# Patient Record
Sex: Female | Born: 1960 | Race: White | Hispanic: No | Marital: Married | Smoking: Never smoker
Health system: Southern US, Community
[De-identification: ages and names within clinical notes are randomized; demographics above are authoritative.]

## PROBLEM LIST (undated history)

## (undated) DIAGNOSIS — R1310 Dysphagia, unspecified: Secondary | ICD-10-CM

## (undated) DIAGNOSIS — I809 Phlebitis and thrombophlebitis of unspecified site: Secondary | ICD-10-CM

## (undated) DIAGNOSIS — E785 Hyperlipidemia, unspecified: Secondary | ICD-10-CM

## (undated) DIAGNOSIS — K219 Gastro-esophageal reflux disease without esophagitis: Secondary | ICD-10-CM

## (undated) DIAGNOSIS — G4730 Sleep apnea, unspecified: Secondary | ICD-10-CM

## (undated) DIAGNOSIS — T7840XA Allergy, unspecified, initial encounter: Secondary | ICD-10-CM

## (undated) DIAGNOSIS — K5900 Constipation, unspecified: Secondary | ICD-10-CM

## (undated) DIAGNOSIS — F419 Anxiety disorder, unspecified: Secondary | ICD-10-CM

## (undated) DIAGNOSIS — J302 Other seasonal allergic rhinitis: Secondary | ICD-10-CM

## (undated) DIAGNOSIS — M1990 Unspecified osteoarthritis, unspecified site: Secondary | ICD-10-CM

## (undated) DIAGNOSIS — E559 Vitamin D deficiency, unspecified: Secondary | ICD-10-CM

## (undated) HISTORY — PX: WISDOM TOOTH EXTRACTION: SHX21

## (undated) HISTORY — DX: Other seasonal allergic rhinitis: J30.2

## (undated) HISTORY — DX: Phlebitis and thrombophlebitis of unspecified site: I80.9

## (undated) HISTORY — DX: Dysphagia, unspecified: R13.10

## (undated) HISTORY — DX: Vitamin D deficiency, unspecified: E55.9

## (undated) HISTORY — DX: Allergy, unspecified, initial encounter: T78.40XA

## (undated) HISTORY — DX: Hyperlipidemia, unspecified: E78.5

## (undated) HISTORY — DX: Constipation, unspecified: K59.00

## (undated) HISTORY — DX: Unspecified osteoarthritis, unspecified site: M19.90

## (undated) HISTORY — PX: COLONOSCOPY: SHX174

## (undated) HISTORY — DX: Sleep apnea, unspecified: G47.30

---

## 2003-09-25 ENCOUNTER — Emergency Department (HOSPITAL_COMMUNITY): Admission: AD | Admit: 2003-09-25 | Discharge: 2003-09-25 | Payer: Self-pay | Admitting: Family Medicine

## 2004-03-29 ENCOUNTER — Encounter: Admission: RE | Admit: 2004-03-29 | Discharge: 2004-03-29 | Payer: Self-pay | Admitting: Hematology and Oncology

## 2004-06-09 ENCOUNTER — Emergency Department (HOSPITAL_COMMUNITY): Admission: EM | Admit: 2004-06-09 | Discharge: 2004-06-09 | Payer: Self-pay | Admitting: Family Medicine

## 2004-06-09 ENCOUNTER — Ambulatory Visit (HOSPITAL_COMMUNITY): Admission: RE | Admit: 2004-06-09 | Discharge: 2004-06-09 | Payer: Self-pay | Admitting: Family Medicine

## 2004-12-05 ENCOUNTER — Emergency Department (HOSPITAL_COMMUNITY): Admission: EM | Admit: 2004-12-05 | Discharge: 2004-12-05 | Payer: Self-pay | Admitting: Family Medicine

## 2005-04-17 ENCOUNTER — Encounter: Admission: RE | Admit: 2005-04-17 | Discharge: 2005-04-17 | Payer: Self-pay | Admitting: Internal Medicine

## 2005-05-19 ENCOUNTER — Emergency Department (HOSPITAL_COMMUNITY): Admission: EM | Admit: 2005-05-19 | Discharge: 2005-05-19 | Payer: Self-pay | Admitting: Family Medicine

## 2005-08-18 ENCOUNTER — Emergency Department (HOSPITAL_COMMUNITY): Admission: EM | Admit: 2005-08-18 | Discharge: 2005-08-18 | Payer: Self-pay | Admitting: Family Medicine

## 2005-09-22 ENCOUNTER — Emergency Department (HOSPITAL_COMMUNITY): Admission: EM | Admit: 2005-09-22 | Discharge: 2005-09-22 | Payer: Self-pay | Admitting: Family Medicine

## 2005-10-04 ENCOUNTER — Ambulatory Visit (HOSPITAL_COMMUNITY): Admission: RE | Admit: 2005-10-04 | Discharge: 2005-10-04 | Payer: Self-pay | Admitting: Allergy and Immunology

## 2006-06-20 ENCOUNTER — Encounter: Admission: RE | Admit: 2006-06-20 | Discharge: 2006-06-20 | Payer: Self-pay | Admitting: Internal Medicine

## 2006-09-22 ENCOUNTER — Emergency Department (HOSPITAL_COMMUNITY): Admission: EM | Admit: 2006-09-22 | Discharge: 2006-09-22 | Payer: Self-pay | Admitting: Emergency Medicine

## 2007-08-04 ENCOUNTER — Encounter: Admission: RE | Admit: 2007-08-04 | Discharge: 2007-08-04 | Payer: Self-pay | Admitting: Internal Medicine

## 2008-07-30 ENCOUNTER — Emergency Department (HOSPITAL_COMMUNITY): Admission: EM | Admit: 2008-07-30 | Discharge: 2008-07-30 | Payer: Self-pay | Admitting: Family Medicine

## 2008-08-15 ENCOUNTER — Encounter: Admission: RE | Admit: 2008-08-15 | Discharge: 2008-08-15 | Payer: Self-pay | Admitting: Internal Medicine

## 2009-04-19 ENCOUNTER — Emergency Department (HOSPITAL_COMMUNITY): Admission: EM | Admit: 2009-04-19 | Discharge: 2009-04-19 | Payer: Self-pay | Admitting: Family Medicine

## 2009-09-28 ENCOUNTER — Encounter: Admission: RE | Admit: 2009-09-28 | Discharge: 2009-09-28 | Payer: Self-pay | Admitting: Internal Medicine

## 2010-10-28 ENCOUNTER — Encounter: Payer: Self-pay | Admitting: Internal Medicine

## 2010-10-29 ENCOUNTER — Encounter: Payer: Self-pay | Admitting: Internal Medicine

## 2010-10-30 ENCOUNTER — Encounter
Admission: RE | Admit: 2010-10-30 | Discharge: 2010-10-30 | Payer: Self-pay | Source: Home / Self Care | Attending: Internal Medicine | Admitting: Internal Medicine

## 2011-10-03 ENCOUNTER — Other Ambulatory Visit: Payer: Self-pay | Admitting: Internal Medicine

## 2011-10-03 DIAGNOSIS — Z1231 Encounter for screening mammogram for malignant neoplasm of breast: Secondary | ICD-10-CM

## 2011-11-01 ENCOUNTER — Ambulatory Visit: Payer: Self-pay

## 2011-11-12 ENCOUNTER — Ambulatory Visit: Payer: Self-pay

## 2011-11-18 ENCOUNTER — Ambulatory Visit
Admission: RE | Admit: 2011-11-18 | Discharge: 2011-11-18 | Disposition: A | Discharge status: Home / Self Care | Payer: Private Health Insurance - Indemnity | Source: Ambulatory Visit | Attending: Internal Medicine | Admitting: Internal Medicine

## 2011-11-18 DIAGNOSIS — Z1231 Encounter for screening mammogram for malignant neoplasm of breast: Secondary | ICD-10-CM

## 2011-12-22 ENCOUNTER — Emergency Department (INDEPENDENT_AMBULATORY_CARE_PROVIDER_SITE_OTHER)
Admission: EM | Admit: 2011-12-22 | Discharge: 2011-12-22 | Disposition: A | Discharge status: Home / Self Care | Payer: Managed Care, Other (non HMO) | Source: Home / Self Care | Attending: Emergency Medicine | Admitting: Emergency Medicine

## 2011-12-22 ENCOUNTER — Encounter (HOSPITAL_COMMUNITY): Payer: Self-pay

## 2011-12-22 DIAGNOSIS — J069 Acute upper respiratory infection, unspecified: Secondary | ICD-10-CM

## 2011-12-22 MED ORDER — AMOXICILLIN 875 MG PO TABS: 875.0000 mg | ORAL_TABLET | Freq: Two times a day (BID) | ORAL | Status: AC

## 2011-12-22 NOTE — ED Provider Notes (Signed)
History     CSN: [id number]  Arrival date & time 12/22/11  1013   First MD Initiated Contact with Patient 12/22/11 1025      Chief Complaint  Patient presents with  . Sinusitis    sinus pressure, cough, sore throat, fever    (Consider location/radiation/quality/duration/timing/severity/associated sxs/prior treatment) HPI Comments: PT with frequent sinus infections.  Began feeling ill 2-3 days ago, sinus pressure is worsening.    Patient is a 51 y.o. female presenting with sinusitis. The history is provided by the patient.  Sinusitis  This is a new problem. The current episode started 2 days ago. The problem has been gradually worsening. Maximum temperature: temp this am was 99.8 so took ibuprofen. The fever has been present for less than 1 day. The pain is at a severity of 8/10. The pain is severe. The pain has been constant since onset. Associated symptoms include chills, congestion, ear pain, sinus pressure, sore throat and cough. Pertinent negatives include no shortness of breath. Treatments tried: sudafed, zyrtec and robitussin dm. The treatment provided no relief.    Past Medical History  Diagnosis Date  . Environmental allergies     History reviewed. No pertinent past surgical history.  Family History  Problem Relation Age of Onset  . Hypertension Mother   . Diabetes Mother     History  Substance Use Topics  . Smoking status: Never Smoker   . Smokeless tobacco: Not on file  . Alcohol Use: Yes     occas.    OB History    Grav Para Term Preterm Abortions TAB SAB Ect Mult Living                  Review of Systems  Constitutional: Positive for chills. Negative for fever.  HENT: Positive for ear pain, congestion, sore throat, postnasal drip and sinus pressure.   Respiratory: Positive for cough. Negative for shortness of breath and wheezing.   Skin: Negative for rash.    Allergies  Sulfa antibiotics  Home Medications   Current Outpatient Rx  Name Route  Sig Dispense Refill  . CELECOXIB 200 MG PO CAPS Oral Take 200 mg by mouth 2 (two) times daily.    Marland Kitchen CETIRIZINE HCL 10 MG PO TABS Oral Take 10 mg by mouth daily.    Lenn Sink DM PO Oral Take by mouth.    . IBUPROFEN 200 MG PO TABS Oral Take 200 mg by mouth every 6 (six) hours as needed.    Marland Kitchen PSEUDOEPHEDRINE HCL 30 MG PO TABS Oral Take 30 mg by mouth every 4 (four) hours as needed.    . AMOXICILLIN 875 MG PO TABS Oral Take 1 tablet (875 mg total) by mouth 2 (two) times daily. 20 tablet 0    BP 141/93  Pulse 85  Temp(Src) 98.8 F (37.1 C) (Oral)  Resp 18  SpO2 98%  Physical Exam  Constitutional: She appears well-developed and well-nourished. No distress.  HENT:  Right Ear: Tympanic membrane, external ear and ear canal normal.  Left Ear: Tympanic membrane, external ear and ear canal normal.  Nose: Mucosal edema present. No rhinorrhea. Right sinus exhibits maxillary sinus tenderness and frontal sinus tenderness. Left sinus exhibits maxillary sinus tenderness and frontal sinus tenderness.  Mouth/Throat: Oropharynx is clear and moist.  Cardiovascular: Normal rate and regular rhythm.   Pulmonary/Chest: Effort normal and breath sounds normal.    ED Course  Procedures (including critical care time)  Labs Reviewed - No data to display No  results found.   1. URI (upper respiratory infection)       MDM  Pt does not have sinus infection, but apparently gets them frequently.  Discussed at length that pt does not need antibiotics at this time.  Pt requests rx for antibiotics that she can take if she gets worse or is not better after 10 days.          Cathlyn Parsons, NP 12/22/11 1235

## 2011-12-22 NOTE — Discharge Instructions (Signed)
You have an upper respiratory viral infection with sinus congestion.  You do not have a bacterial sinus infection.  Occasionally, this type of illness can develop into a bacterial infection.  Please wait 10 days before starting to take the antibiotics.  You should be feeling better by then.  If you are not, you may begin taking the antibiotics.  In the meantime, use your neti pot several times a day, and continue using the over the counter cold medicines.  Drink LOTS of liquids.   Upper Respiratory Infection, Adult An upper respiratory infection (URI) is also known as the common cold. It is often caused by a type of germ (virus). Colds are easily spread (contagious). You can pass it to others by kissing, coughing, sneezing, or drinking out of the same glass. Usually, you get better in 1 or 2 weeks.  HOME CARE   Only take medicine as told by your doctor.   Use a warm mist humidifier or breathe in steam from a hot shower.   Drink enough water and fluids to keep your pee (urine) clear or pale yellow.   Get plenty of rest.   Return to work when your temperature is back to normal or as told by your doctor. You may use a face mask and wash your hands to stop your cold from spreading.  GET HELP RIGHT AWAY IF:   After the first few days, you feel you are getting worse.   You have questions about your medicine.   You have chills, shortness of breath, or brown or red spit (mucus).   You have yellow or brown snot (nasal discharge) or pain in the face, especially when you bend forward.   You have a fever, puffy (swollen) neck, pain when you swallow, or white spots in the back of your throat.   You have a bad headache, ear pain, sinus pain, or chest pain.   You have a high-pitched whistling sound when you breathe in and out (wheezing).   You have a lasting cough or cough up blood.   You have sore muscles or a stiff neck.  MAKE SURE YOU:   Understand these instructions.   Will watch your  condition.   Will get help right away if you are not doing well or get worse.  Document Released: 03/11/2008 Document Revised: 09/12/2011 Document Reviewed: 01/28/2011 [facility] Patient Information 2012 [place], [place].

## 2011-12-22 NOTE — ED Provider Notes (Signed)
Medical screening examination/treatment/procedure(s) were performed by non-physician practitioner and as supervising physician I was immediately available for consultation/collaboration.  Raynald Blend, MD 12/22/11 1329

## 2011-12-22 NOTE — ED Notes (Signed)
Pt c/o sinus pressure/pain/congestion, cough, sore throat and low grade fever. States symptoms started 2 days ago.

## 2012-08-11 ENCOUNTER — Telehealth: Payer: Self-pay | Admitting: Internal Medicine

## 2012-08-11 ENCOUNTER — Encounter: Payer: Self-pay | Admitting: Internal Medicine

## 2012-08-11 NOTE — Telephone Encounter (Signed)
Forward 10 pages from

## 2012-08-11 NOTE — Telephone Encounter (Signed)
Forward 10 pages from [facility] to Dr. Stan Head for review on 08-11-12 ym

## 2012-09-25 ENCOUNTER — Encounter: Payer: Managed Care, Other (non HMO) | Admitting: Internal Medicine

## 2012-12-29 ENCOUNTER — Other Ambulatory Visit: Payer: Self-pay

## 2012-12-29 DIAGNOSIS — Z1231 Encounter for screening mammogram for malignant neoplasm of breast: Secondary | ICD-10-CM

## 2013-02-01 ENCOUNTER — Ambulatory Visit
Admission: RE | Admit: 2013-02-01 | Discharge: 2013-02-01 | Disposition: A | Discharge status: Home / Self Care | Payer: 59 | Source: Ambulatory Visit

## 2013-02-01 DIAGNOSIS — Z1231 Encounter for screening mammogram for malignant neoplasm of breast: Secondary | ICD-10-CM

## 2013-02-03 ENCOUNTER — Other Ambulatory Visit: Payer: Self-pay | Admitting: Internal Medicine

## 2013-02-03 DIAGNOSIS — R928 Other abnormal and inconclusive findings on diagnostic imaging of breast: Secondary | ICD-10-CM

## 2013-02-10 ENCOUNTER — Ambulatory Visit
Admission: RE | Admit: 2013-02-10 | Discharge: 2013-02-10 | Disposition: A | Discharge status: Home / Self Care | Payer: 59 | Source: Ambulatory Visit | Attending: Internal Medicine | Admitting: Internal Medicine

## 2013-02-10 DIAGNOSIS — R928 Other abnormal and inconclusive findings on diagnostic imaging of breast: Secondary | ICD-10-CM

## 2013-05-20 ENCOUNTER — Encounter: Payer: Self-pay | Admitting: Internal Medicine

## 2013-07-23 ENCOUNTER — Ambulatory Visit (AMBULATORY_SURGERY_CENTER): Payer: Self-pay | Admitting: *Deleted

## 2013-07-23 VITALS — Ht 66.0 in | Wt 172.0 lb

## 2013-07-23 DIAGNOSIS — Z1211 Encounter for screening for malignant neoplasm of colon: Secondary | ICD-10-CM

## 2013-07-23 MED ORDER — NA SULFATE-K SULFATE-MG SULF 17.5-3.13-1.6 GM/177ML PO SOLN: 1.0000 | Freq: Once | ORAL | Status: DC

## 2013-07-23 NOTE — Progress Notes (Signed)
No egg or soy allergy. No anesthesia problems.  

## 2013-07-27 ENCOUNTER — Telehealth: Payer: Self-pay | Admitting: Internal Medicine

## 2013-07-27 NOTE — Telephone Encounter (Signed)
Spoke with patient regarding the medication (Qsymia), which she is taking for weight loss. She is taking 3.75mg  every other day.I spoke with Janet-the nurse anesthetist who states the pt has to go off this medication for 7- 10 days prior to the colonoscopy or she will need to reschedule the procedure. The pt was advised about the medication, but will stop it today. She will keep her appt. for the colonoscopy for 08/06/13 as planned.The pt will call back if she has any other questions.Cherylann Ratel

## 2013-08-06 ENCOUNTER — Ambulatory Visit (AMBULATORY_SURGERY_CENTER): Payer: 59 | Admitting: Internal Medicine

## 2013-08-06 ENCOUNTER — Encounter: Payer: Self-pay | Admitting: Internal Medicine

## 2013-08-06 VITALS — BP 116/70 | HR 56 | Temp 98.2°F | Resp 17 | Ht 66.0 in | Wt 172.0 lb

## 2013-08-06 DIAGNOSIS — Z1211 Encounter for screening for malignant neoplasm of colon: Secondary | ICD-10-CM

## 2013-08-06 MED ORDER — SODIUM CHLORIDE 0.9 % IV SOLN: 500.0000 mL | INTRAVENOUS | Status: DC

## 2013-08-06 NOTE — Patient Instructions (Addendum)
Your colon is normal.   Next routine colonoscopy in 10 years - 2024.  I appreciate the opportunity to care for you. Iva Boop, MD, FACG YOU HAD AN ENDOSCOPIC PROCEDURE TODAY AT THE [facility]: Refer to the procedure report that was given to you for any specific questions about what was found during the examination.  If the procedure report does not answer your questions, please call your gastroenterologist to clarify.  If you requested that your care partner not be given the details of your procedure findings, then the procedure report has been included in a sealed envelope for you to review at your convenience later.  YOU SHOULD EXPECT: Some feelings of bloating in the abdomen. Passage of more gas than usual.  Walking can help get rid of the air that was put into your GI tract during the procedure and reduce the bloating. If you had a lower endoscopy (such as a colonoscopy or flexible sigmoidoscopy) you may notice spotting of blood in your stool or on the toilet paper. If you underwent a bowel prep for your procedure, then you may not have a normal bowel movement for a few days.  DIET: Your first meal following the procedure should be a light meal and then it is ok to progress to your normal diet.  A half-sandwich or bowl of soup is an example of a good first meal.  Heavy or fried foods are harder to digest and may make you feel nauseous or bloated.  Likewise meals heavy in dairy and vegetables can cause extra gas to form and this can also increase the bloating.  Drink plenty of fluids but you should avoid alcoholic beverages for 24 hours.  ACTIVITY: Your care partner should take you home directly after the procedure.  You should plan to take it easy, moving slowly for the rest of the day.  You can resume normal activity the day after the procedure however you should NOT DRIVE or use heavy machinery for 24 hours (because of the sedation medicines used during the test).    SYMPTOMS  TO REPORT IMMEDIATELY: A gastroenterologist can be reached at any hour.  During normal business hours, 8:30 AM to 5:00 PM Monday through Friday, call [phone].  After hours and on weekends, please call the GI answering service at [phone] who will take a message and have the physician on call contact you.   Following lower endoscopy (colonoscopy or flexible sigmoidoscopy):  Excessive amounts of blood in the stool  Significant tenderness or worsening of abdominal pains  Swelling of the abdomen that is new, acute  Fever of 100F or higher  FOLLOW UP: If any biopsies were taken you will be contacted by phone or by letter within the next 1-3 weeks.  Call your gastroenterologist if you have not heard about the biopsies in 3 weeks.  Our staff will call the home number listed on your records the next business day following your procedure to check on you and address any questions or concerns that you may have at that time regarding the information given to you following your procedure. This is a courtesy call and so if there is no answer at the home number and we have not heard from you through the emergency physician on call, we will assume that you have returned to your regular daily activities without incident.  SIGNATURES/CONFIDENTIALITY: You and/or your care partner have signed paperwork which will be entered into your electronic medical record.  These signatures  attest to the fact that that the information above on your After Visit Summary has been reviewed and is understood.  Full responsibility of the confidentiality of this discharge information lies with you and/or your care-partner.

## 2013-08-06 NOTE — Progress Notes (Signed)
Patient did not experience any of the following events: a burn prior to discharge; a fall within the facility; wrong site/side/patient/procedure/implant event; or a hospital transfer or hospital admission upon discharge from the facility. (G8907) Patient did not have preoperative order for IV antibiotic SSI prophylaxis. (G8918)  

## 2013-08-06 NOTE — Op Note (Signed)
[facility] [address] [place], [address]   COLONOSCOPY PROCEDURE REPORT  PATIENT: Victoria Herman, Victoria Herman  MR#: [id number] BIRTHDATE: 1960/11/07 , 51  yrs. old GENDER: Female ENDOSCOPIST: Iva Boop, MD, [facility] PROCEDURE DATE:  08/06/2013 PROCEDURE:   Colonoscopy, screening First Screening Colonoscopy - Avg.  risk and is 50 yrs.  old or older Yes.  Prior Negative Screening - Now for repeat screening. N/A  History of Adenoma - Now for follow-up colonoscopy & has been > or = to 3 yrs.  N/A  Polyps Removed Today? No.  Recommend repeat exam, <10 yrs? No. ASA CLASS:   Class I INDICATIONS:average risk screening and first colonoscopy. MEDICATIONS: propofol (Diprivan) 200mg  IV, These medications were titrated to patient response per physician's verbal order, and MAC sedation, administered by CRNA  DESCRIPTION OF PROCEDURE:   After the risks benefits and alternatives of the procedure were thoroughly explained, informed consent was obtained.  A digital rectal exam revealed no abnormalities of the rectum.   The LB [id number]  endoscope was introduced through the anus and advanced to the cecum, which was identified by both the appendix and ileocecal valve. No adverse events experienced.   The quality of the prep was Suprep good  The instrument was then slowly withdrawn as the colon was fully examined.      COLON FINDINGS: A normal appearing cecum, ileocecal valve, and appendiceal orifice were identified.  The ascending, hepatic flexure, transverse, splenic flexure, descending, sigmoid colon and rectum appeared unremarkable.  No polyps or cancers were seen.   A right colon retroflexion was performed.  Retroflexed views revealed no abnormalities. The time to cecum=2 minutes 31 seconds. Withdrawal time=9 minutes 35 seconds.  The scope was withdrawn and the procedure completed. COMPLICATIONS: There were no complications.  ENDOSCOPIC IMPRESSION: Normal colonoscopy  - excellent prep - first screening exam  RECOMMENDATIONS: Repeat routine screening colonoscopy 10 years - 2024   eSigned:  Iva Boop, MD, [employer] 08/06/2013 10:20 AM   cc: Dorothyann Peng, MD and The Patient

## 2013-08-06 NOTE — Progress Notes (Signed)
Pt stable to RR 

## 2013-08-09 ENCOUNTER — Telehealth: Payer: Self-pay | Admitting: *Deleted

## 2013-08-09 NOTE — Telephone Encounter (Signed)
  Follow up Call-  Call back number 08/06/2013  Post procedure Call Back phone  # [phone]  Permission to leave phone message No     Patient questions:  Do you have a fever, pain , or abdominal swelling? no Pain Score  0 *  Have you tolerated food without any problems? yes  Have you been able to return to your normal activities? yes  Do you have any questions about your discharge instructions: Diet   no Medications  no Follow up visit  no  Do you have questions or concerns about your Care? no  Actions: * If pain score is 4 or above: No action needed, pain <4.

## 2013-12-20 ENCOUNTER — Other Ambulatory Visit: Payer: Self-pay

## 2013-12-20 DIAGNOSIS — Z1231 Encounter for screening mammogram for malignant neoplasm of breast: Secondary | ICD-10-CM

## 2014-02-07 ENCOUNTER — Ambulatory Visit: Payer: 59

## 2014-02-14 ENCOUNTER — Ambulatory Visit
Admission: RE | Admit: 2014-02-14 | Discharge: 2014-02-14 | Disposition: A | Discharge status: Home / Self Care | Payer: 59 | Source: Ambulatory Visit

## 2014-02-14 ENCOUNTER — Encounter (INDEPENDENT_AMBULATORY_CARE_PROVIDER_SITE_OTHER): Payer: Self-pay

## 2014-02-14 DIAGNOSIS — Z1231 Encounter for screening mammogram for malignant neoplasm of breast: Secondary | ICD-10-CM

## 2014-02-16 ENCOUNTER — Other Ambulatory Visit: Payer: Self-pay | Admitting: Internal Medicine

## 2014-02-16 DIAGNOSIS — R928 Other abnormal and inconclusive findings on diagnostic imaging of breast: Secondary | ICD-10-CM

## 2014-02-25 ENCOUNTER — Ambulatory Visit
Admission: RE | Admit: 2014-02-25 | Discharge: 2014-02-25 | Disposition: A | Discharge status: Home / Self Care | Payer: 59 | Source: Ambulatory Visit | Attending: Internal Medicine | Admitting: Internal Medicine

## 2014-02-25 DIAGNOSIS — R928 Other abnormal and inconclusive findings on diagnostic imaging of breast: Secondary | ICD-10-CM

## 2014-03-20 ENCOUNTER — Emergency Department (HOSPITAL_BASED_OUTPATIENT_CLINIC_OR_DEPARTMENT_OTHER): Payer: 59

## 2014-03-20 ENCOUNTER — Emergency Department (HOSPITAL_BASED_OUTPATIENT_CLINIC_OR_DEPARTMENT_OTHER)
Admission: EM | Admit: 2014-03-20 | Discharge: 2014-03-21 | Disposition: A | Discharge status: Home / Self Care | Payer: 59 | Attending: Emergency Medicine | Admitting: Emergency Medicine

## 2014-03-20 ENCOUNTER — Encounter (HOSPITAL_BASED_OUTPATIENT_CLINIC_OR_DEPARTMENT_OTHER): Payer: Self-pay | Admitting: Emergency Medicine

## 2014-03-20 DIAGNOSIS — Z86718 Personal history of other venous thrombosis and embolism: Secondary | ICD-10-CM | POA: Insufficient documentation

## 2014-03-20 DIAGNOSIS — E785 Hyperlipidemia, unspecified: Secondary | ICD-10-CM | POA: Insufficient documentation

## 2014-03-20 DIAGNOSIS — I809 Phlebitis and thrombophlebitis of unspecified site: Secondary | ICD-10-CM | POA: Diagnosis present

## 2014-03-20 DIAGNOSIS — Z79899 Other long term (current) drug therapy: Secondary | ICD-10-CM | POA: Insufficient documentation

## 2014-03-20 DIAGNOSIS — I8000 Phlebitis and thrombophlebitis of superficial vessels of unspecified lower extremity: Secondary | ICD-10-CM | POA: Insufficient documentation

## 2014-03-20 NOTE — ED Notes (Signed)
Pt reports awoke this morning with soreness and redness LLE hx DVT right leg

## 2014-03-20 NOTE — ED Notes (Signed)
Concerned about a small firm area of redness to lateral aspect of left knee.  Strong family history of DVT/PE.  Did make an approx 3 hour drive in the car this am.  Denies chest pain, SHOB.  Pedal pulses present, equal.  No swelling, coolness to extremities.

## 2014-03-20 NOTE — ED Provider Notes (Signed)
CSN: [id number]     Arrival date & time 03/20/14  1914 History  This chart was scribed for Blanchard Kelch, MD by Roxan Diesel, ED scribe.  This patient was seen in room MH12/MH12 and the patient's care was started at 9:01 PM.    Chief Complaint  Patient presents with  . Leg Pain    hx DVT     Patient is a 53 y.o. female presenting with leg pain. The history is provided by the patient. No language interpreter was used.  Leg Pain Location:  Leg Injury: no   Leg location:  L leg Pain details:    Severity:  Moderate   Onset quality: noticed upon waking up.   Duration:  12 hours   Timing:  Constant Chronicity:  New Dislocation: no   Ineffective treatments:  Heat Associated symptoms: no back pain, no fever and no neck pain   Risk factors comment:  History of DVT  HPI Comments: Victoria Herman is a 53 y.o. female with a history of DVT who presents to the Emergency Department complaining of lateral left distal thigh pain that began upon waking up this morning.  She reports associated redness and swelling. Patient states that she applied a warm compress to the area this afternoon without relief. She denies SOB. She previously had a superficial blood clot on her lateral right calf and has a family history of DVT/PE.  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   Past Medical History  Diagnosis Date  . Seasonal allergies   . Hyperlipidemia   . DVT (deep venous thrombosis)    Past Surgical History  Procedure Laterality Date  . Wisdom tooth extraction     Family History  Problem Relation Age of Onset  . Hypertension Mother   . Diabetes Mother   . Colon cancer Neg Hx   . Esophageal cancer Neg Hx   . Rectal cancer Neg Hx   . Stomach cancer Neg Hx    History  Substance Use Topics  . Smoking status: Never Smoker   . Smokeless tobacco: Not on file  . Alcohol Use: Yes     Comment: occas.   OB History   Grav Para Term Preterm Abortions TAB SAB Ect Mult Living                  Review of Systems  Constitutional: Negative for fever and chills.  HENT: Negative for congestion, rhinorrhea and sore throat.   Eyes: Negative for visual disturbance.  Respiratory: Negative for cough and shortness of breath.   Cardiovascular: Negative for chest pain and leg swelling.  Gastrointestinal: Negative for abdominal pain.  Genitourinary: Negative for dysuria.  Musculoskeletal: Negative for back pain and neck pain.       Left lateral thigh pain swelling, and redness.  Skin: Negative for rash.  Neurological: Negative for headaches.  Hematological: Does not bruise/bleed easily.  Psychiatric/Behavioral: Negative for confusion.      Allergies  Sulfa antibiotics  Home Medications   Prior to Admission medications   Medication Sig Start Date End Date Taking? Authorizing Provider  cetirizine (ZYRTEC) 10 MG tablet Take 10 mg by mouth daily.    Historical Provider, MD  cholecalciferol (VITAMIN D) 1000 UNITS tablet Take 1,000 Units by mouth daily.    Historical Provider, MD  ibuprofen (ADVIL,MOTRIN) 200 MG tablet Take 200 mg by mouth every 6 (six) hours as needed.    Historical Provider, MD  Phentermine-Topiramate (QSYMIA) 3.75-23 MG CP24 Take 1 capsule  by mouth every other day.    Historical Provider, MD   BP 153/69  Pulse 90  Temp(Src) 98.3 F (36.8 C) (Oral)  Resp 22  SpO2 99% Physical Exam  Nursing note and vitals reviewed. Constitutional: She is oriented to person, place, and time. She appears well-developed and well-nourished. No distress.  HENT:  Head: Normocephalic and atraumatic.  Mouth/Throat: Oropharynx is clear and moist.  Eyes: Conjunctivae and EOM are normal. Pupils are equal, round, and reactive to light.  Neck: Normal range of motion. Neck supple. No tracheal deviation present.  Cardiovascular: Normal rate, regular rhythm and normal heart sounds.  Exam reveals no gallop and no friction rub.   No murmur heard. Pulmonary/Chest: Effort normal and breath  sounds normal. No respiratory distress. She has no wheezes. She has no rales.  Abdominal: Soft. Bowel sounds are normal. She exhibits no distension and no mass. There is no tenderness. There is no rebound and no guarding.  Musculoskeletal: Normal range of motion.  Mild erythema and soft tissue swelling (approximately 4x4 cm) in the left lateral distal thigh.   Lower extremities symmetric in appearance.  No pain with deep palpation of thighs bilaterally.   Neurological: She is alert and oriented to person, place, and time.  Skin: Skin is warm and dry.  Psychiatric: She has a normal mood and affect. Her behavior is normal.    ED Course  Procedures (including critical care time) DIAGNOSTIC STUDIES: Oxygen Saturation is 99% on room air, normal by my interpretation.    COORDINATION OF CARE: 9:11 PM-Discussed treatment plan which includes [place] with pt at bedside and pt agreed to plan.     Labs Review Labs Reviewed - No data to display  Imaging Review US Venous Img Lower Unilateral Left  03/20/2014   CLINICAL DATA:  Sudden onset of left lateral thigh and anterior leg pain and erythema. History of superficial thrombophlebitis.  EXAM: LEFT LOWER EXTREMITY VENOUS DOPPLER ULTRASOUND  TECHNIQUE: Gray-scale sonography with graded compression, as well as color Doppler and duplex ultrasound were performed to evaluate the lower extremity deep venous systems from the level of the common femoral vein and including the common femoral, femoral, profunda femoral, popliteal and calf veins including the posterior tibial, peroneal and gastrocnemius veins when visible. The superficial great saphenous vein was also interrogated. Spectral Doppler was utilized to evaluate flow at rest and with distal augmentation maneuvers in the common femoral, femoral and popliteal veins.  COMPARISON:  None.  FINDINGS: Common Femoral Vein: No evidence of thrombus. Normal compressibility, respiratory phasicity and response to  augmentation.  Saphenofemoral Junction: No evidence of thrombus. Normal compressibility and flow on color Doppler imaging.  Profunda Femoral Vein: No evidence of thrombus. Normal compressibility and flow on color Doppler imaging.  Femoral Vein: No evidence of thrombus. Normal compressibility, respiratory phasicity and response to augmentation.  Popliteal Vein: No evidence of thrombus. Normal compressibility, respiratory phasicity and response to augmentation.  Calf Veins: No evidence of thrombus. Normal compressibility and flow on color Doppler imaging.  Superficial Great Saphenous Vein: No evidence of thrombus. Normal compressibility and flow on color Doppler imaging.  Venous Reflux:  None.  Other Findings: There is a cluster of superficial veins at the lateral aspect of the distal thigh, containing clot, compatible with the patient's history of superficial thrombophlebitis. This corresponds to the location of the patient's pain.  IMPRESSION: No evidence of deep venous thrombosis. Clot noted within a cluster of superficial veins at the lateral aspect of the distal thigh,  compatible with the patient's history of superficial thrombophlebitis. This corresponds to the location of the patient's pain.   Electronically Signed   By: Garald Balding M.D.   On: 03/20/2014 23:54     EKG Interpretation None      MDM   Final diagnoses:  Superficial thrombophlebitis    11:06 PM 53 y.o. female the history of superficial thrombophlebitis in the right lower extremity and a family history of blood clots who presents with an area of mild erythema and swelling to the left lower lateral thigh which she first noticed today. She denies any shortness of breath or chest pain. She is afebrile and vital signs are unremarkable here. Will get ultrasound to rule out DVT.  12:12 AM: Found to have superficial thrombophlebitis. Will rec warm compresses, NSAIDS.  I have discussed the diagnosis/risks/treatment options with the patient  and believe the pt to be eligible for discharge home to follow-up with pcp next week. We also discussed returning to the ED immediately if new or worsening sx occur. We discussed the sx which are most concerning (e.g., worsening pain, asymmetry, inc swelling) that necessitate immediate return. Medications administered to the patient during their visit and any new prescriptions provided to the patient are listed below.  Medications given during this visit Medications - No data to display  New Prescriptions   No medications on file      I personally performed the services described in this documentation, which was scribed in my presence. The recorded information has been reviewed and is accurate.    Blanchard Kelch, MD 03/21/14 [phone]

## 2014-03-21 DIAGNOSIS — I809 Phlebitis and thrombophlebitis of unspecified site: Secondary | ICD-10-CM | POA: Diagnosis present

## 2014-07-18 ENCOUNTER — Telehealth: Payer: Self-pay | Admitting: *Deleted

## 2014-07-18 ENCOUNTER — Telehealth: Payer: Self-pay

## 2014-07-18 NOTE — Telephone Encounter (Signed)
Called patient to get additional info on her recent bout of thrombophlebitis. Sh had similar symptoms in her left leg in June and had an ultrasound to r/o dvt. The dx was superficial thrombophlebitis. She is now experiencing similar symptoms in her right leg. I suggested heat and Ibuprofen. She would like to come in and have a bilat reflux study to get a baseline venous study for reflux. She saw Dr. Donnetta Hutching in 2006 (?) she thinks, so we will get thaose appts scheduled as sonn as his sched allows.

## 2014-07-18 NOTE — Telephone Encounter (Signed)
Phone call from pt.  C/o "inflammed, hard, tender area on right lower extremity, to the right of the shin."  Reported her symptoms started on Friday, 10/9.  Denies any open sores.  Denies any swelling of the right lower extremity.  Reports seen by her PCP, recently, and told to make appt. with Dr. Donnetta Hutching.   Advised to elevate right lower extremity, apply compression hose, and take Ibuprofen for the discomfort.  Stated she doesn't have compression stockings, but has been taking Ibuprofen and elevating.  Requesting an appt. this week.  Discussed with L. Wert, RN, vein nurse; she will contact pt. Per phone and discuss symptoms.

## 2014-09-07 ENCOUNTER — Other Ambulatory Visit: Payer: Self-pay | Admitting: *Deleted

## 2014-09-07 DIAGNOSIS — I83893 Varicose veins of bilateral lower extremities with other complications: Secondary | ICD-10-CM

## 2014-09-12 ENCOUNTER — Encounter: Payer: Self-pay | Admitting: Vascular Surgery

## 2014-09-13 ENCOUNTER — Ambulatory Visit (HOSPITAL_COMMUNITY)
Admission: RE | Admit: 2014-09-13 | Discharge: 2014-09-13 | Disposition: A | Discharge status: Home / Self Care | Payer: 59 | Source: Ambulatory Visit | Attending: Vascular Surgery | Admitting: Vascular Surgery

## 2014-09-13 ENCOUNTER — Encounter: Payer: Self-pay | Admitting: Vascular Surgery

## 2014-09-13 ENCOUNTER — Ambulatory Visit (INDEPENDENT_AMBULATORY_CARE_PROVIDER_SITE_OTHER): Payer: 59 | Admitting: Vascular Surgery

## 2014-09-13 VITALS — BP 123/84 | HR 90 | Resp 18 | Ht 64.5 in | Wt 200.8 lb

## 2014-09-13 DIAGNOSIS — I872 Venous insufficiency (chronic) (peripheral): Secondary | ICD-10-CM | POA: Diagnosis not present

## 2014-09-13 DIAGNOSIS — I83893 Varicose veins of bilateral lower extremities with other complications: Secondary | ICD-10-CM | POA: Diagnosis not present

## 2014-09-13 NOTE — Progress Notes (Signed)
Patient name: Victoria Herman MRN: [id number] DOB: 05/01/1961 Sex: female   Referred by: [facility]  Reason for referral:  Chief Complaint  Patient presents with  . New Evaluation    c/o 3 episodes of superficial thrombophlebitis episode of #1 03-2014 left lateral thigh episode #2 left inner calf 07-2014 and #3 right outer calf  07-2014    . Varicose Veins    HISTORY OF PRESENT ILLNESS: Patient is seen today for continued discussion of venous pathology. I had seen her approximately 10 years ago at which time workup showed no correctable venous hypertension. 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 These began in her thighs extending all the way down to her feet. She has had several episodes over the past several months where she has had thromboses of these superficial varicosities in these areas of telangiectasia. There are large areas of matting of these as well. She initially presented to the emergency department on the episode involving her left lateral thigh. She reports that she was in the emergency department for 7 hours and eventually was discharged with aspirin and ibuprofen. She's had no bleeding from these and has never had a history of DVT. Does have a family history of DVT in her mother and 1 brother. Does have extensive venous pathology on both sides of her family. 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 Seasonal allergies   . Hyperlipidemia   . Superficial thrombophlebitis 03-2014, 07-2014  . Allergy     Past Surgical History  Procedure Laterality Date  . Wisdom tooth extraction      History   Social History  . Marital Status: Married    Spouse Name: N/A    Number of Children: N/A  . Years of Education: N/A   Occupational History  . Not on file.   Social History Main Topics  . Smoking status: Never Smoker   . Smokeless tobacco: Not on file  . Alcohol  Use: Yes     Comment: occas.  . Drug Use: No  . Sexual Activity: No   Other Topics Concern  . Not on file   Social History Narrative    Family History  Problem Relation Age of Onset  . Hypertension Mother   . Diabetes Mother   . Varicose Veins Mother   . Colon cancer Neg Hx   . Esophageal cancer Neg Hx   . Rectal cancer Neg Hx   . Stomach cancer Neg Hx   . Varicose Veins Father   . Varicose Veins Brother     Allergies as of 09/13/2014 - Review Complete 09/13/2014  Allergen Reaction Noted  . Sulfa antibiotics Rash 12/22/2011    Current Outpatient Prescriptions on File Prior to Visit  Medication Sig Dispense Refill  . cetirizine (ZYRTEC) 10 MG tablet Take 10 mg by mouth daily.    . cholecalciferol (VITAMIN D) 1000 UNITS tablet Take 1,000 Units by mouth daily.    Marland Kitchen ibuprofen (ADVIL,MOTRIN) 200 MG tablet Take 200 mg by mouth every 6 (six) hours as needed.    . Phentermine-Topiramate (QSYMIA) 3.75-23 MG CP24 Take 1 capsule by mouth every other day.     No current facility-administered medications on file prior to visit.     REVIEW OF SYSTEMS:  Positives indicated with an "X"  CARDIOVASCULAR:  [ ]  chest pain   [ ]  chest pressure   [ ]   palpitations   [ ]  orthopnea   [ ]  dyspnea on exertion   [ ]  claudication   [ ]  rest pain   [ ]  DVT   [x ] phlebitis PULMONARY:   [ ]  productive cough   [ ]  asthma   [ ]  wheezing NEUROLOGIC:   [ ]  weakness  [ ]  paresthesias  [ ]  aphasia  [ ]  amaurosis  [ ]  dizziness HEMATOLOGIC:   [ ]  bleeding problems   [ ]  clotting disorders MUSCULOSKELETAL:  [ ]  joint pain   [ ]  joint swelling GASTROINTESTINAL: [ ]   blood in stool  [ ]   hematemesis GENITOURINARY:  [ ]   dysuria  [ ]   hematuria PSYCHIATRIC:  [ ]  history of major depression INTEGUMENTARY:  [ ]  rashes  [ ]  ulcers CONSTITUTIONAL:  [ ]  fever   [ ]  chills  PHYSICAL EXAMINATION:  General: The patient is a well-nourished female, in no acute distress. Vital signs are BP 123/84 mmHg  Pulse  90  Resp 18  Ht 5' 4.5" (1.638 m)  Wt 200 lb 12.8 oz (91.082 kg)  BMI 33.95 kg/m2 Pulmonary: There is a good air exchange   Musculoskeletal: There are no major deformities.  There is no significant extremity pain. Neurologic: No focal weakness or paresthesias are detected, Skin: There are no ulcer or rashes noted. Psychiatric: The patient has normal affect. 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 She does have some reflux in the great saphenous vein in the left thigh only does have a venous incompetence in the common femoral veins bilaterally  Impression and Plan:  Multiple large telangiectasia bilateral lower extremities with recent episodes of some thrombus in these. No evidence of DVT. I do not feel that this she would have any improvement with ablation of the area of reflux in her left great saphenous vein. This does not go into these areas of telangiectasia or where she has had thrombophlebitis. She has tried compression garments in the past with little response from these. I would recommend continued observation only with her usual activity and compression garments on an as-needed basis she was reassured with this discussion will see [place] again on as-needed basis    Dorotea Hand [facility] Office: [phone]

## 2015-01-24 ENCOUNTER — Other Ambulatory Visit: Payer: Self-pay

## 2015-01-24 DIAGNOSIS — Z1231 Encounter for screening mammogram for malignant neoplasm of breast: Secondary | ICD-10-CM

## 2015-02-20 ENCOUNTER — Ambulatory Visit
Admission: RE | Admit: 2015-02-20 | Discharge: 2015-02-20 | Disposition: A | Discharge status: Home / Self Care | Payer: 59 | Source: Ambulatory Visit

## 2015-02-20 ENCOUNTER — Ambulatory Visit: Payer: 59

## 2015-02-20 DIAGNOSIS — Z1231 Encounter for screening mammogram for malignant neoplasm of breast: Secondary | ICD-10-CM

## 2015-11-27 MED FILL — MAGIC MOUTHWASH BOP FORM: 8 days supply | Qty: 150 | Fill #1

## 2015-12-16 DIAGNOSIS — H524 Presbyopia: Secondary | ICD-10-CM | POA: Diagnosis not present

## 2016-03-07 ENCOUNTER — Other Ambulatory Visit: Payer: Self-pay | Admitting: Internal Medicine

## 2016-03-07 DIAGNOSIS — Z1231 Encounter for screening mammogram for malignant neoplasm of breast: Secondary | ICD-10-CM

## 2016-03-21 DIAGNOSIS — J3089 Other allergic rhinitis: Secondary | ICD-10-CM | POA: Diagnosis not present

## 2016-03-25 ENCOUNTER — Ambulatory Visit
Admission: RE | Admit: 2016-03-25 | Discharge: 2016-03-25 | Disposition: A | Discharge status: Home / Self Care | Payer: 59 | Source: Ambulatory Visit | Attending: Internal Medicine | Admitting: Internal Medicine

## 2016-03-25 DIAGNOSIS — Z1231 Encounter for screening mammogram for malignant neoplasm of breast: Secondary | ICD-10-CM | POA: Diagnosis not present

## 2016-04-29 DIAGNOSIS — J3089 Other allergic rhinitis: Secondary | ICD-10-CM | POA: Diagnosis not present

## 2016-05-22 ENCOUNTER — Telehealth: Payer: 59 | Admitting: Family

## 2016-05-22 DIAGNOSIS — B349 Viral infection, unspecified: Secondary | ICD-10-CM

## 2016-05-22 DIAGNOSIS — B9789 Other viral agents as the cause of diseases classified elsewhere: Secondary | ICD-10-CM

## 2016-05-22 DIAGNOSIS — J329 Chronic sinusitis, unspecified: Secondary | ICD-10-CM | POA: Diagnosis not present

## 2016-05-22 MED ORDER — FLUTICASONE PROPIONATE 50 MCG/ACT NA SUSP: 2.0000 | Freq: Every day | NASAL | 6 refills | Status: DC

## 2016-05-22 MED FILL — FLUTICASONE PROP 50 MCG SPR: 50 | 30 days supply | Qty: 16 | Fill #0

## 2016-05-22 NOTE — Progress Notes (Signed)
We are sorry that you are not feeling well.  Here is how we plan to help!  Based on what you have shared with me it looks like you have sinusitis.  Sinusitis is inflammation and infection in the sinus cavities of the head.  Based on your presentation I believe you most likely have Acute Viral Sinusitis.This is an infection most likely caused by a virus. There is not specific treatment for viral sinusitis other than to help you with the symptoms until the infection runs its course.  You may use an oral decongestant such as Mucinex D or if you have glaucoma or high blood pressure use plain Mucinex. Saline nasal spray help and can safely be used as often as needed for congestion, I have prescribed: Fluticasone nasal spray two sprays in each nostril twice a day  If your symptoms do not resolve over the next few days, then you may need an antibiotic at that time.   Some authorities believe that zinc sprays or the use of Echinacea may shorten the course of your symptoms.  Sinus infections are not as easily transmitted as other respiratory infection, however we still recommend that you avoid close contact with loved ones, especially the very young and elderly.  Remember to wash your hands thoroughly throughout the day as this is the number one way to prevent the spread of infection!  Home Care:  Only take medications as instructed by your medical team.  Complete the entire course of an antibiotic.  Do not take these medications with alcohol.  A steam or ultrasonic humidifier can help congestion.  You can place a towel over your head and breathe in the steam from hot water coming from a faucet.  Avoid close contacts especially the very young and the elderly.  Cover your mouth when you cough or sneeze.  Always remember to wash your hands.  Get Help Right Away If:  You develop worsening fever or sinus pain.  You develop a severe head ache or visual changes.  Your symptoms persist after you have  completed your treatment plan.  Make sure you  Understand these instructions.  Will watch your condition.  Will get help right away if you are not doing well or get worse.  Your e-visit answers were reviewed by a board certified advanced clinical practitioner to complete your personal care plan.  Depending on the condition, your plan could have included both over the counter or prescription medications.  If there is a problem please reply  once you have received a response from your provider.  Your safety is important to [place].  If you have drug allergies check your prescription carefully.    You can use MyChart to ask questions about today's visit, request a non-urgent call back, or ask for a work or school excuse for 24 hours related to this e-Visit. If it has been greater than 24 hours you will need to follow up with your provider, or enter a new e-Visit to address those concerns.  You will get an e-mail in the next two days asking about your experience.  I hope that your e-visit has been valuable and will speed your recovery. Thank you for using e-visits.

## 2016-06-06 ENCOUNTER — Other Ambulatory Visit: Payer: Self-pay | Admitting: Occupational Medicine

## 2016-06-06 ENCOUNTER — Ambulatory Visit: Payer: Self-pay

## 2016-06-06 DIAGNOSIS — R0781 Pleurodynia: Secondary | ICD-10-CM

## 2016-09-05 DIAGNOSIS — Z01419 Encounter for gynecological examination (general) (routine) without abnormal findings: Secondary | ICD-10-CM | POA: Diagnosis not present

## 2016-09-05 DIAGNOSIS — Z6835 Body mass index (BMI) 35.0-35.9, adult: Secondary | ICD-10-CM | POA: Diagnosis not present

## 2016-09-05 DIAGNOSIS — Z1212 Encounter for screening for malignant neoplasm of rectum: Secondary | ICD-10-CM | POA: Diagnosis not present

## 2016-09-05 DIAGNOSIS — Z0000 Encounter for general adult medical examination without abnormal findings: Secondary | ICD-10-CM | POA: Diagnosis not present

## 2016-09-06 DIAGNOSIS — D2262 Melanocytic nevi of left upper limb, including shoulder: Secondary | ICD-10-CM | POA: Diagnosis not present

## 2016-09-06 DIAGNOSIS — I8391 Asymptomatic varicose veins of right lower extremity: Secondary | ICD-10-CM | POA: Diagnosis not present

## 2016-09-06 DIAGNOSIS — I8392 Asymptomatic varicose veins of left lower extremity: Secondary | ICD-10-CM | POA: Diagnosis not present

## 2016-09-06 DIAGNOSIS — D1801 Hemangioma of skin and subcutaneous tissue: Secondary | ICD-10-CM | POA: Diagnosis not present

## 2016-09-06 DIAGNOSIS — D225 Melanocytic nevi of trunk: Secondary | ICD-10-CM | POA: Diagnosis not present

## 2016-09-06 DIAGNOSIS — D2272 Melanocytic nevi of left lower limb, including hip: Secondary | ICD-10-CM | POA: Diagnosis not present

## 2016-09-06 DIAGNOSIS — D2261 Melanocytic nevi of right upper limb, including shoulder: Secondary | ICD-10-CM | POA: Diagnosis not present

## 2016-09-06 DIAGNOSIS — L821 Other seborrheic keratosis: Secondary | ICD-10-CM | POA: Diagnosis not present

## 2016-09-06 DIAGNOSIS — D2211 Melanocytic nevi of right eyelid, including canthus: Secondary | ICD-10-CM | POA: Diagnosis not present

## 2017-01-21 MED FILL — MOMETASONE FUROATE 50 MCG S: 50 | 30 days supply | Qty: 17 | Fill #0

## 2017-02-14 ENCOUNTER — Other Ambulatory Visit: Payer: Self-pay | Admitting: Internal Medicine

## 2017-02-14 DIAGNOSIS — Z1231 Encounter for screening mammogram for malignant neoplasm of breast: Secondary | ICD-10-CM

## 2017-03-31 ENCOUNTER — Ambulatory Visit
Admission: RE | Admit: 2017-03-31 | Discharge: 2017-03-31 | Disposition: A | Discharge status: Home / Self Care | Payer: 59 | Source: Ambulatory Visit | Attending: Internal Medicine | Admitting: Internal Medicine

## 2017-03-31 DIAGNOSIS — Z1231 Encounter for screening mammogram for malignant neoplasm of breast: Secondary | ICD-10-CM

## 2017-04-01 MED FILL — EPINEPHRINE 0.3 MG AUTO-INJ: 0.3 | 2 days supply | Qty: 2 | Fill #0

## 2017-04-03 ENCOUNTER — Other Ambulatory Visit: Payer: Self-pay | Admitting: Internal Medicine

## 2017-04-03 DIAGNOSIS — E2839 Other primary ovarian failure: Secondary | ICD-10-CM

## 2017-04-07 DIAGNOSIS — J3089 Other allergic rhinitis: Secondary | ICD-10-CM | POA: Diagnosis not present

## 2017-04-08 ENCOUNTER — Ambulatory Visit
Admission: RE | Admit: 2017-04-08 | Discharge: 2017-04-08 | Disposition: A | Discharge status: Home / Self Care | Payer: 59 | Source: Ambulatory Visit | Attending: Internal Medicine | Admitting: Internal Medicine

## 2017-04-08 DIAGNOSIS — M8589 Other specified disorders of bone density and structure, multiple sites: Secondary | ICD-10-CM | POA: Diagnosis not present

## 2017-04-08 DIAGNOSIS — Z780 Asymptomatic menopausal state: Secondary | ICD-10-CM | POA: Diagnosis not present

## 2017-04-08 DIAGNOSIS — E2839 Other primary ovarian failure: Secondary | ICD-10-CM

## 2017-04-24 DIAGNOSIS — J3089 Other allergic rhinitis: Secondary | ICD-10-CM | POA: Diagnosis not present

## 2017-09-24 MED FILL — AZELASTINE HCL 137 MCG SPRY: 0.1 | 25 days supply | Qty: 30 | Fill #0

## 2017-09-25 ENCOUNTER — Telehealth: Payer: 59 | Admitting: Physician Assistant

## 2017-09-25 DIAGNOSIS — J069 Acute upper respiratory infection, unspecified: Secondary | ICD-10-CM | POA: Diagnosis not present

## 2017-09-25 DIAGNOSIS — B9789 Other viral agents as the cause of diseases classified elsewhere: Secondary | ICD-10-CM | POA: Diagnosis not present

## 2017-09-25 MED ORDER — BENZONATATE 100 MG PO CAPS: 100.0000 mg | ORAL_CAPSULE | Freq: Three times a day (TID) | ORAL | 0 refills | Status: DC | PRN

## 2017-09-25 NOTE — Progress Notes (Signed)

## 2017-10-15 MED FILL — MOMETASONE FUROATE 50 MCG S: 50 | 30 days supply | Qty: 17 | Fill #1

## 2017-10-24 ENCOUNTER — Telehealth: Payer: 59 | Admitting: Nurse Practitioner

## 2017-10-24 DIAGNOSIS — R05 Cough: Secondary | ICD-10-CM | POA: Diagnosis not present

## 2017-10-24 DIAGNOSIS — R059 Cough, unspecified: Secondary | ICD-10-CM

## 2017-10-24 MED ORDER — AZITHROMYCIN 250 MG PO TABS: ORAL_TABLET | ORAL | 0 refills | Status: DC

## 2017-10-24 MED FILL — AZITHROMYCIN 250 MG TABLET: 250 | 5 days supply | Qty: 6 | Fill #0

## 2017-10-24 NOTE — Progress Notes (Signed)
We are sorry that you are not feeling well.  Here is how we plan to help!  Based on your presentation I believe you most likely have A cough due to bacteria.  When patients have a fever and a productive cough with a change in color or increased sputum production, we are concerned about bacterial bronchitis.  If left untreated it can progress to pneumonia.  If your symptoms do not improve with your treatment plan it is important that you contact your provider.   I have prescribed Azithromyin 250 mg: two tablets now and then one tablet daily for 4 additonal days    In addition you may use A non-prescription cough medication called Mucinex DM: take 2 tablets every 12 hours. and A prescription cough medication called Tessalon Perles 100mg . You may take 1-2 capsules every 8 hours as needed for your cough.   From your responses in the eVisit questionnaire you describe inflammation in the upper respiratory tract which is causing a significant cough.  This is commonly called Bronchitis and has four common causes:    Allergies  Viral Infections  Acid Reflux  Bacterial Infection Allergies, viruses and acid reflux are treated by controlling symptoms or eliminating the cause. An example might be a cough caused by taking certain blood pressure medications. You stop the cough by changing the medication. Another example might be a cough caused by acid reflux. Controlling the reflux helps control the cough.  USE OF BRONCHODILATOR ("RESCUE") INHALERS: There is a risk from using your bronchodilator too frequently.  The risk is that over-reliance on a medication which only relaxes the muscles surrounding the breathing tubes can reduce the effectiveness of medications prescribed to reduce swelling and congestion of the tubes themselves.  Although you feel brief relief from the bronchodilator inhaler, your asthma may actually be worsening with the tubes becoming more swollen and filled with mucus.  This can delay  other crucial treatments, such as oral steroid medications. If you need to use a bronchodilator inhaler daily, several times per day, you should discuss this with your provider.  There are probably better treatments that could be used to keep your asthma under control.     HOME CARE . Only take medications as instructed by your medical team. . Complete the entire course of an antibiotic. . Drink plenty of fluids and get plenty of rest. . Avoid close contacts especially the very young and the elderly . Cover your mouth if you cough or cough into your sleeve. . Always remember to wash your hands . A steam or ultrasonic humidifier can help congestion.   GET HELP RIGHT AWAY IF: . You develop worsening fever. . You become short of breath . You cough up blood. . Your symptoms persist after you have completed your treatment plan MAKE SURE YOU   Understand these instructions.  Will watch your condition.  Will get help right away if you are not doing well or get worse.  Your e-visit answers were reviewed by a board certified advanced clinical practitioner to complete your personal care plan.  Depending on the condition, your plan could have included both over the counter or prescription medications. If there is a problem please reply  once you have received a response from your provider. Your safety is important to [place].  If you have drug allergies check your prescription carefully.    You can use MyChart to ask questions about today's visit, request a non-urgent call back, or ask for a work  or school excuse for 24 hours related to this e-Visit. If it has been greater than 24 hours you will need to follow up with your provider, or enter a new e-Visit to address those concerns. You will get an e-mail in the next two days asking about your experience.  I hope that your e-visit has been valuable and will speed your recovery. Thank you for using e-visits.

## 2017-10-26 ENCOUNTER — Encounter: Payer: Self-pay | Admitting: Emergency Medicine

## 2017-10-26 ENCOUNTER — Emergency Department (INDEPENDENT_AMBULATORY_CARE_PROVIDER_SITE_OTHER)
Admission: EM | Admit: 2017-10-26 | Discharge: 2017-10-26 | Disposition: A | Discharge status: Home / Self Care | Payer: 59 | Source: Home / Self Care | Attending: Family Medicine | Admitting: Family Medicine

## 2017-10-26 ENCOUNTER — Emergency Department (INDEPENDENT_AMBULATORY_CARE_PROVIDER_SITE_OTHER): Payer: 59

## 2017-10-26 DIAGNOSIS — R509 Fever, unspecified: Secondary | ICD-10-CM | POA: Diagnosis not present

## 2017-10-26 DIAGNOSIS — R05 Cough: Secondary | ICD-10-CM

## 2017-10-26 DIAGNOSIS — R053 Chronic cough: Secondary | ICD-10-CM

## 2017-10-26 LAB — POCT CBC W AUTO DIFF (K'VILLE URGENT CARE)

## 2017-10-26 LAB — POCT INFLUENZA A/B
INFLUENZA B, POC: NEGATIVE
Influenza A, POC: NEGATIVE

## 2017-10-26 MED ORDER — PREDNISONE 20 MG PO TABS: ORAL_TABLET | ORAL | 0 refills | Status: DC

## 2017-10-26 MED ORDER — BENZONATATE 200 MG PO CAPS: ORAL_CAPSULE | ORAL | 0 refills | Status: DC

## 2017-10-26 NOTE — ED Triage Notes (Signed)
Patient complaining of non-productive cough since 12/16, on Zpack and Mucinex still running a low grade fever, requesting CXR.  Can hear some wheezing when laying down.

## 2017-10-26 NOTE — Discharge Instructions (Signed)
Continue Azithromycin. Take plain guaifenesin (1200mg  extended release tabs such as Mucinex) twice daily, with plenty of water, for cough and congestion.  May add Pseudoephedrine (30mg , one or two every 4 to 6 hours) for sinus congestion.  Get adequate rest.   May use Afrin nasal spray (or generic oxymetazoline) each morning for about 5 days and then discontinue.  Also recommend using saline nasal spray several times daily and saline nasal irrigation (AYR is a common brand).  Use Nasonex nasal spray each morning after using Afrin nasal spray and saline nasal irrigation. Try warm salt water gargles for sore throat.  Stop all antihistamines for now, and other non-prescription cough/cold preparations.

## 2017-10-26 NOTE — ED Provider Notes (Signed)
[facility]    CSN: [id number] Arrival date & time: 10/26/17  1219     History   Chief Complaint Chief Complaint  Patient presents with  . Cough    HPI Victoria Herman is a 57 y.o. female.   Patient reports that she developed a cold about one month ago and then visited [place].  Upon returning about 2.5 weeks ago she had persistent sinus congestion and cough.  During the past week she became increasingly fatigued.  She participated in an e-visit two days ago and was started on a Z-pak.  She has been taking Mucinex DM and Delsym at bedtime.  Last night she developed a low grade fever and her ears feel clogged.  She had mild wheezing today. She has a family history of asthma (brother).   The history is provided by the patient.    Past Medical History:  Diagnosis Date  . Allergy   . Hyperlipidemia   . Seasonal allergies   . Superficial thrombophlebitis 03-2014, 07-2014    Patient Active Problem List   Diagnosis Date Noted  . Superficial thrombophlebitis 03/21/2014    Past Surgical History:  Procedure Laterality Date  . WISDOM TOOTH EXTRACTION      OB History    No data available       Home Medications    Prior to Admission medications   Medication Sig Start Date End Date Taking? Authorizing Provider  azithromycin (ZITHROMAX Z-PAK) 250 MG tablet As directed 10/24/17   Hassell Done, Mary-Margaret, FNP  benzonatate (TESSALON) 200 MG capsule Take one cap by mouth at bedtime as needed for cough.  May repeat in 4 to 6 hours 10/26/17   Kandra Nicolas, MD  cetirizine (ZYRTEC) 10 MG tablet Take 10 mg by mouth daily.    [provider]  predniSONE (DELTASONE) 20 MG tablet Take one tab by mouth twice daily for 5 days, then one daily. Take with food. 10/26/17   Kandra Nicolas, MD    Family History Family History  Problem Relation Age of Onset  . Hypertension Mother   . Diabetes Mother   . Varicose Veins Mother   . Varicose Veins Father   . Varicose  Veins Brother   . Colon cancer Neg Hx   . Esophageal cancer Neg Hx   . Rectal cancer Neg Hx   . Stomach cancer Neg Hx   . Breast cancer Neg Hx     Social History Social History   Tobacco Use  . Smoking status: Never Smoker  . Smokeless tobacco: Never Used  Substance Use Topics  . Alcohol use: Yes    Comment: occas.  . Drug use: No     Allergies   E-mycin [erythromycin] and Sulfa antibiotics   Review of Systems Review of Systems No sore throat + cough No pleuritic pain + wheezing + nasal congestion No post-nasal drainage No sinus pain/pressure No itchy/red eyes No earache No hemoptysis No SOB + fever, + chills No nausea No vomiting No abdominal pain No diarrhea No urinary symptoms No skin rash + fatigue No myalgias No headache Used OTC meds without relief   Physical Exam Triage Vital Signs ED Triage Vitals [10/26/17 1354]  Enc Vitals Group     BP 130/80     Pulse Rate (!) 101     Resp      Temp 99.9 F (37.7 C)     Temp Source Oral     SpO2 96 %  Weight 200 lb (90.7 kg)     Height 5\' 6"  (1.676 m)     Head Circumference      Peak Flow      Pain Score 0     Pain Loc      Pain Edu?      Excl. in [place]?    No data found.  Updated Vital Signs BP 130/80 (BP Location: Right Arm)   Pulse (!) 101   Temp 99.9 F (37.7 C) (Oral)   Ht 5\' 6"  (1.676 m)   Wt 200 lb (90.7 kg)   LMP 07/23/2013   SpO2 96%   BMI 32.28 kg/m   Visual Acuity Right Eye Distance:   Left Eye Distance:   Bilateral Distance:    Right Eye Near:   Left Eye Near:    Bilateral Near:     Physical Exam Nursing notes and Vital Signs reviewed. Appearance:  Patient appears stated age, and in no acute distress Eyes:  Pupils are equal, round, and reactive to light and accomodation.  Extraocular movement is intact.  Conjunctivae are not inflamed  Ears:  Canals normal.  Tympanic membranes normal.  Nose:  Mildly congested turbinates.  No sinus tenderness.  Pharynx:   Normal Neck:  Supple.  Enlarged posterior/lateral nodes are palpated bilaterally, tender to palpation on the left.   Lungs:  Clear to auscultation.  Breath sounds are equal.  Moving air well. Heart:  Regular rate and rhythm without murmurs, rubs, or gallops.  Rate 108 Abdomen:  Nontender without masses or hepatosplenomegaly.  Bowel sounds are present.  No CVA or flank tenderness.  Extremities:  No edema.  Skin:  No rash present.    UC Treatments / Results  Labs (all labs ordered are listed, but only abnormal results are displayed) Labs Reviewed  POCT CBC W AUTO DIFF ([facility]):  WBC 7.8; LY 21.5; MO 11.8; GR 66.7; Hgb 15.0; Platelets 243   POCT INFLUENZA A/B negative    EKG  EKG Interpretation None       Radiology Dg Chest 2 View  Result Date: 10/26/2017 CLINICAL DATA:  Cough and fever EXAM: CHEST  2 VIEW COMPARISON:  June 06, 2016 FINDINGS: There is no edema or consolidation. The heart size and pulmonary vascularity are normal. No adenopathy. No bone lesions. IMPRESSION: No edema or consolidation. Electronically Signed   By: Lowella Grip III M.D.   On: 10/26/2017 14:20    Procedures Procedures (including critical care time)  Medications Ordered in UC Medications - No data to display   Initial Impression / Assessment and Plan / UC Course  I have reviewed the triage vital signs and the nursing notes.  Pertinent labs & imaging results that were available during my care of the patient were reviewed by me and considered in my medical decision making (see chart for details).    Normal WBC and chest x-ray reassuring. Suspect post-infectious cough with bronchospasm.  ?new viral URI.   Patient is currently covered for atypical organisms with azithromycin. Begin prednisone burst/taper. Prescription written for Benzonatate [facility]) to take at bedtime for night-time cough.  Continue Azithromycin. Take plain guaifenesin (1200mg  extended release tabs such as  Mucinex) twice daily, with plenty of water, for cough and congestion.  May add Pseudoephedrine (30mg , one or two every 4 to 6 hours) for sinus congestion.  Get adequate rest.   May use Afrin nasal spray (or generic oxymetazoline) each morning for about 5 days and then discontinue.  Also recommend  using saline nasal spray several times daily and saline nasal irrigation (AYR is a common brand).  Use Nasonex nasal spray each morning after using Afrin nasal spray and saline nasal irrigation. Try warm salt water gargles for sore throat.  Stop all antihistamines for now, and other non-prescription cough/cold preparations. Followup with Family Doctor if not improved in one week.     Final Clinical Impressions(s) / UC Diagnoses   Final diagnoses:  Persistent cough for 3 weeks or longer    ED Discharge Orders        Ordered    predniSONE (DELTASONE) 20 MG tablet     10/26/17 1521    benzonatate (TESSALON) 200 MG capsule     10/26/17 1521           Kandra Nicolas, MD 10/31/17 2054

## 2017-11-03 DIAGNOSIS — F4322 Adjustment disorder with anxiety: Secondary | ICD-10-CM | POA: Diagnosis not present

## 2017-11-03 DIAGNOSIS — Z6834 Body mass index (BMI) 34.0-34.9, adult: Secondary | ICD-10-CM | POA: Diagnosis not present

## 2017-11-03 DIAGNOSIS — Z0000 Encounter for general adult medical examination without abnormal findings: Secondary | ICD-10-CM | POA: Diagnosis not present

## 2017-11-12 DIAGNOSIS — D2262 Melanocytic nevi of left upper limb, including shoulder: Secondary | ICD-10-CM | POA: Diagnosis not present

## 2017-11-12 DIAGNOSIS — L84 Corns and callosities: Secondary | ICD-10-CM | POA: Diagnosis not present

## 2017-11-12 DIAGNOSIS — L918 Other hypertrophic disorders of the skin: Secondary | ICD-10-CM | POA: Diagnosis not present

## 2017-11-12 DIAGNOSIS — L738 Other specified follicular disorders: Secondary | ICD-10-CM | POA: Diagnosis not present

## 2017-11-12 DIAGNOSIS — D225 Melanocytic nevi of trunk: Secondary | ICD-10-CM | POA: Diagnosis not present

## 2017-11-12 DIAGNOSIS — L821 Other seborrheic keratosis: Secondary | ICD-10-CM | POA: Diagnosis not present

## 2017-11-12 DIAGNOSIS — I8391 Asymptomatic varicose veins of right lower extremity: Secondary | ICD-10-CM | POA: Diagnosis not present

## 2017-11-12 DIAGNOSIS — I8392 Asymptomatic varicose veins of left lower extremity: Secondary | ICD-10-CM | POA: Diagnosis not present

## 2017-11-12 DIAGNOSIS — D2261 Melanocytic nevi of right upper limb, including shoulder: Secondary | ICD-10-CM | POA: Diagnosis not present

## 2017-11-23 ENCOUNTER — Telehealth: Payer: 59 | Admitting: Family

## 2017-11-23 DIAGNOSIS — R6889 Other general symptoms and signs: Secondary | ICD-10-CM | POA: Diagnosis not present

## 2017-11-23 MED ORDER — OSELTAMIVIR PHOSPHATE 75 MG PO CAPS: 75.0000 mg | ORAL_CAPSULE | Freq: Two times a day (BID) | ORAL | 0 refills | Status: DC

## 2017-11-23 NOTE — Progress Notes (Signed)

## 2017-11-24 MED FILL — OSELTAMIVIR PHOSPHATE 75 MG: 75 | 5 days supply | Qty: 10 | Fill #0

## 2017-12-03 DIAGNOSIS — D72829 Elevated white blood cell count, unspecified: Secondary | ICD-10-CM | POA: Diagnosis not present

## 2018-02-17 ENCOUNTER — Other Ambulatory Visit: Payer: Self-pay | Admitting: Internal Medicine

## 2018-02-17 DIAGNOSIS — Z1231 Encounter for screening mammogram for malignant neoplasm of breast: Secondary | ICD-10-CM

## 2018-04-13 ENCOUNTER — Ambulatory Visit
Admission: RE | Admit: 2018-04-13 | Discharge: 2018-04-13 | Disposition: A | Discharge status: Home / Self Care | Payer: 59 | Source: Ambulatory Visit | Attending: Internal Medicine | Admitting: Internal Medicine

## 2018-04-13 DIAGNOSIS — J3089 Other allergic rhinitis: Secondary | ICD-10-CM | POA: Diagnosis not present

## 2018-04-13 DIAGNOSIS — Z1231 Encounter for screening mammogram for malignant neoplasm of breast: Secondary | ICD-10-CM | POA: Diagnosis not present

## 2018-04-13 MED FILL — EPINEPHRINE 0.3 MG AUTO-INJ: 0.3 | 30 days supply | Qty: 2 | Fill #0

## 2018-04-13 MED FILL — MOMETASONE FUROATE 50 MCG S: 50 | 30 days supply | Qty: 17 | Fill #0

## 2018-04-14 DIAGNOSIS — J3089 Other allergic rhinitis: Secondary | ICD-10-CM | POA: Diagnosis not present

## 2018-05-12 MED FILL — SHINGRIX 50 MCG SUS: 50 | 1 days supply | Qty: 1 | Fill #0

## 2018-07-20 MED FILL — SHINGRIX 50 MCG SUS: 50 | 1 days supply | Qty: 1 | Fill #1

## 2018-08-23 DIAGNOSIS — H524 Presbyopia: Secondary | ICD-10-CM | POA: Diagnosis not present

## 2018-11-23 MED FILL — MOMETASONE FUROATE 50 MCG S: 50 | 30 days supply | Qty: 17 | Fill #1

## 2018-11-30 ENCOUNTER — Encounter: Payer: Self-pay | Admitting: Internal Medicine

## 2018-12-25 DIAGNOSIS — D2271 Melanocytic nevi of right lower limb, including hip: Secondary | ICD-10-CM | POA: Diagnosis not present

## 2018-12-25 DIAGNOSIS — D2261 Melanocytic nevi of right upper limb, including shoulder: Secondary | ICD-10-CM | POA: Diagnosis not present

## 2018-12-25 DIAGNOSIS — D2262 Melanocytic nevi of left upper limb, including shoulder: Secondary | ICD-10-CM | POA: Diagnosis not present

## 2018-12-25 DIAGNOSIS — L821 Other seborrheic keratosis: Secondary | ICD-10-CM | POA: Diagnosis not present

## 2018-12-25 DIAGNOSIS — D225 Melanocytic nevi of trunk: Secondary | ICD-10-CM | POA: Diagnosis not present

## 2018-12-25 DIAGNOSIS — D1801 Hemangioma of skin and subcutaneous tissue: Secondary | ICD-10-CM | POA: Diagnosis not present

## 2018-12-25 DIAGNOSIS — D2272 Melanocytic nevi of left lower limb, including hip: Secondary | ICD-10-CM | POA: Diagnosis not present

## 2019-02-10 ENCOUNTER — Encounter: Payer: Self-pay | Admitting: Internal Medicine

## 2019-03-03 ENCOUNTER — Other Ambulatory Visit: Payer: Self-pay | Admitting: Internal Medicine

## 2019-03-03 DIAGNOSIS — Z1231 Encounter for screening mammogram for malignant neoplasm of breast: Secondary | ICD-10-CM

## 2019-03-15 MED FILL — MOMETASONE FUROATE 50 MCG S: 50 | 30 days supply | Qty: 17 | Fill #2

## 2019-03-29 ENCOUNTER — Other Ambulatory Visit: Payer: Self-pay

## 2019-03-29 ENCOUNTER — Ambulatory Visit (INDEPENDENT_AMBULATORY_CARE_PROVIDER_SITE_OTHER): Payer: 59 | Admitting: Internal Medicine

## 2019-03-29 ENCOUNTER — Encounter: Payer: Self-pay | Admitting: Internal Medicine

## 2019-03-29 ENCOUNTER — Other Ambulatory Visit (HOSPITAL_COMMUNITY)
Admission: RE | Admit: 2019-03-29 | Discharge: 2019-03-29 | Disposition: A | Discharge status: Home / Self Care | Payer: 59 | Source: Ambulatory Visit | Attending: Internal Medicine | Admitting: Internal Medicine

## 2019-03-29 VITALS — BP 132/84 | HR 79 | Temp 98.2°F | Wt 213.6 lb

## 2019-03-29 DIAGNOSIS — Z0000 Encounter for general adult medical examination without abnormal findings: Secondary | ICD-10-CM

## 2019-03-29 DIAGNOSIS — Z1211 Encounter for screening for malignant neoplasm of colon: Secondary | ICD-10-CM | POA: Diagnosis not present

## 2019-03-29 DIAGNOSIS — E2839 Other primary ovarian failure: Secondary | ICD-10-CM

## 2019-03-29 DIAGNOSIS — Z124 Encounter for screening for malignant neoplasm of cervix: Secondary | ICD-10-CM

## 2019-03-29 DIAGNOSIS — Z712 Person consulting for explanation of examination or test findings: Secondary | ICD-10-CM

## 2019-03-29 LAB — POCT URINALYSIS DIPSTICK
Bilirubin, UA: NEGATIVE
Blood, UA: NEGATIVE
Glucose, UA: NEGATIVE
Ketones, UA: NEGATIVE
Leukocytes, UA: NEGATIVE
Nitrite, UA: NEGATIVE
Protein, UA: NEGATIVE
Spec Grav, UA: 1.015 (ref 1.010–1.025)
Urobilinogen, UA: 0.2 E.U./dL
pH, UA: 5.5 (ref 5.0–8.0)

## 2019-03-29 LAB — POC HEMOCCULT BLD/STL (OFFICE/1-CARD/DIAGNOSTIC): Fecal Occult Blood, POC: NEGATIVE

## 2019-03-29 NOTE — Progress Notes (Signed)
Subjective:     Patient ID: Victoria Herman , female    DOB: Jul 12, 1961 , 58 y.o.   MRN: [id number]   Chief Complaint  Patient presents with  . Annual Exam    with pap    HPI  She is here today for a full physical examination. She would like to have a pap smear as well. She has no specific concerns or complaints at this time.     Past Medical History:  Diagnosis Date  . Allergy   . Hyperlipidemia   . Seasonal allergies   . Superficial thrombophlebitis 03-2014, 07-2014     Family History  Problem Relation Age of Onset  . Hypertension Mother   . Diabetes Mother   . Varicose Veins Mother   . Varicose Veins Father   . Cancer Father   . Varicose Veins Brother   . Hypertension Brother   . Hypertension Brother   . Varicose Veins Brother   . Colon cancer Neg Hx   . Esophageal cancer Neg Hx   . Rectal cancer Neg Hx   . Stomach cancer Neg Hx   . Breast cancer Neg Hx      Current Outpatient Medications:  .  cetirizine (ZYRTEC) 10 MG tablet, Take 10 mg by mouth daily., Disp: , Rfl:  .  mometasone (NASONEX) 50 MCG/ACT nasal spray, Place 2 sprays into the nose daily., Disp: , Rfl:    Allergies  Allergen Reactions  . E-Mycin [Erythromycin]   . Sulfa Antibiotics Rash     The patient states she uses post menopausal status for birth control. Last LMP was Patient's last menstrual period was 07/23/2013.. Negative for Dysmenorrhea Negative for: breast discharge, breast lump(s), breast pain and breast self exam. Associated symptoms include abnormal vaginal bleeding. Pertinent negatives include abnormal bleeding (hematology), anxiety, decreased libido, depression, difficulty falling sleep, dyspareunia, history of infertility, nocturia, sexual dysfunction, sleep disturbances, urinary incontinence, urinary urgency, vaginal discharge and vaginal itching. Diet regular.The patient states her exercise level is  minimal.   . The patient's tobacco use is:  Social History   Tobacco Use   Smoking Status Never Smoker  Smokeless Tobacco Never Used  . She has been exposed to passive smoke. The patient's alcohol use is:  Social History   Substance and Sexual Activity  Alcohol Use Yes   Comment: occas.    Review of Systems  Constitutional: Negative.   HENT: Negative.   Eyes: Negative.   Respiratory: Negative.   Cardiovascular: Negative.   Gastrointestinal: Negative.   Endocrine: Negative.   Genitourinary: Negative.   Musculoskeletal: Negative.   Skin: Negative.   Allergic/Immunologic: Negative.   Neurological: Negative.   Hematological: Negative.   Psychiatric/Behavioral: Negative.      Today's Vitals   03/29/19 1125  BP: 132/84  Pulse: 79  Temp: 98.2 F (36.8 C)  TempSrc: Oral  Weight: 213 lb 9.6 oz (96.9 kg)   Body mass index is 34.48 kg/m.   Objective:  Physical Exam Vitals signs and nursing note reviewed. Exam conducted with a chaperone present.  Constitutional:      Appearance: Normal appearance. She is obese.  HENT:     Head: Normocephalic and atraumatic.     Right Ear: Tympanic membrane, ear canal and external ear normal.     Left Ear: Tympanic membrane, ear canal and external ear normal.     Nose: Nose normal.     Mouth/Throat:     Mouth: Mucous membranes are moist.  Pharynx: Oropharynx is clear.  Eyes:     Extraocular Movements: Extraocular movements intact.     Conjunctiva/sclera: Conjunctivae normal.     Pupils: Pupils are equal, round, and reactive to light.  Neck:     Musculoskeletal: Normal range of motion and neck supple.  Cardiovascular:     Rate and Rhythm: Normal rate and regular rhythm.     Pulses: Normal pulses.     Heart sounds: Normal heart sounds.     Comments: B/l LE spider veins and venous varicosities Pulmonary:     Effort: Pulmonary effort is normal.     Breath sounds: Normal breath sounds.  Chest:     Breasts:        Right: Normal. No swelling, bleeding, inverted nipple, mass, nipple discharge or skin  change.        Left: Normal. No swelling, bleeding, inverted nipple, mass, nipple discharge or skin change.  Abdominal:     General: Abdomen is flat. Bowel sounds are normal.     Palpations: Abdomen is soft.     Hernia: There is no hernia in the left inguinal area or right inguinal area.     Comments: obese  Genitourinary:    General: Normal vulva.     Exam position: Lithotomy position.     Vagina: Normal.     Cervix: Normal.     Uterus: Normal.      Adnexa: Right adnexa normal and left adnexa normal.     Rectum: Normal. Guaiac result negative.     Comments: Stenotic cervix Musculoskeletal: Normal range of motion.     Right lower leg: 1+ Pitting Edema present.     Left lower leg: 1+ Pitting Edema present.  Skin:    General: Skin is warm and dry.  Neurological:     General: No focal deficit present.     Mental Status: She is alert and oriented to person, place, and time.  Psychiatric:        Mood and Affect: Mood normal.        Behavior: Behavior normal.         Assessment And Plan:     1. Routine general medical examination at health care facility  A full exam was performed. Importance of monthly self breast exams was discussed with the patient. PATIENT HAS BEEN ADVISED TO GET 30-45 MINUTES REGULAR EXERCISE NO LESS THAN FOUR TO FIVE DAYS PER WEEK - BOTH WEIGHTBEARING EXERCISES AND AEROBIC ARE RECOMMENDED.  SHE IS ADVISED TO FOLLOW A HEALTHY DIET WITH AT LEAST SIX FRUITS/VEGGIES PER DAY, DECREASE INTAKE OF RED MEAT, AND TO INCREASE FISH INTAKE TO TWO DAYS PER WEEK.  MEATS/FISH SHOULD NOT BE FRIED, BAKED OR BROILED IS PREFERABLE.  I SUGGEST WEARING SPF 50 SUNSCREEN ON EXPOSED PARTS AND ESPECIALLY WHEN IN THE DIRECT SUNLIGHT FOR AN EXTENDED PERIOD OF TIME.  PLEASE AVOID FAST FOOD RESTAURANTS AND INCREASE YOUR WATER INTAKE.  - CMP14+EGFR - CBC - Lipid panel - Hemoglobin A1c - POCT Urinalysis Dipstick (81002)  2. Pap smear for cervical cancer screening  Pelvic exam performed.  Pap smear performed. Stool heme negative.   - Cytology -Pap Smear - POC Hemoccult Bld/Stl (1-Cd Office Dx)  3. Estrogen deficiency  Most recent dexa scan results reviewed in full detail. Importance of engaging in weight bearing exercises two to three times weekly,along with calcium and vitamin D supplementation was discussed with the patient.   - DG Bone Density; Future        Robyn   Shawnie Dapper, MD    THE PATIENT IS ENCOURAGED TO PRACTICE SOCIAL DISTANCING DUE TO THE COVID-19 PANDEMIC.

## 2019-03-29 NOTE — Patient Instructions (Signed)

## 2019-03-30 ENCOUNTER — Encounter: Payer: Self-pay | Admitting: Internal Medicine

## 2019-03-30 LAB — CMP14+EGFR
ALT: 14 IU/L (ref 0–32)
AST: 20 IU/L (ref 0–40)
Albumin/Globulin Ratio: 1.9 (ref 1.2–2.2)
Albumin: 4.3 g/dL (ref 3.8–4.9)
Alkaline Phosphatase: 97 IU/L (ref 39–117)
BUN/Creatinine Ratio: 21 (ref 9–23)
BUN: 14 mg/dL (ref 6–24)
Bilirubin Total: 0.5 mg/dL (ref 0.0–1.2)
CO2: 23 mmol/L (ref 20–29)
Calcium: 9.5 mg/dL (ref 8.7–10.2)
Chloride: 102 mmol/L (ref 96–106)
Creatinine, Ser: 0.68 mg/dL (ref 0.57–1.00)
GFR calc Af Amer: 113 mL/min/{1.73_m2} (ref >59)
GFR calc non Af Amer: 98 mL/min/{1.73_m2} (ref >59)
Globulin, Total: 2.3 g/dL (ref 1.5–4.5)
Glucose: 84 mg/dL (ref 65–99)
Potassium: 4.4 mmol/L (ref 3.5–5.2)
Sodium: 141 mmol/L (ref 134–144)
Total Protein: 6.6 g/dL (ref 6.0–8.5)

## 2019-03-30 LAB — LIPID PANEL
Chol/HDL Ratio: 3.8 ratio (ref 0.0–4.4)
Cholesterol, Total: 255 mg/dL — ABNORMAL HIGH (ref 100–199)
HDL: 68 mg/dL (ref >39)
LDL Calculated: 163 mg/dL — ABNORMAL HIGH (ref 0–99)
Triglycerides: 120 mg/dL (ref 0–149)
VLDL Cholesterol Cal: 24 mg/dL (ref 5–40)

## 2019-03-30 LAB — CBC
Hematocrit: 42.5 % (ref 34.0–46.6)
Hemoglobin: 13.8 g/dL (ref 11.1–15.9)
MCH: 28.6 pg (ref 26.6–33.0)
MCHC: 32.5 g/dL (ref 31.5–35.7)
MCV: 88 fL (ref 79–97)
Platelets: 319 10*3/uL (ref 150–450)
RBC: 4.82 x10E6/uL (ref 3.77–5.28)
RDW: 13.5 % (ref 11.7–15.4)
WBC: 8.7 10*3/uL (ref 3.4–10.8)

## 2019-03-30 LAB — HEMOGLOBIN A1C
Est. average glucose Bld gHb Est-mCnc: 114 mg/dL
Hgb A1c MFr Bld: 5.6 % (ref 4.8–5.6)

## 2019-03-31 LAB — CYTOLOGY - PAP: Diagnosis: NEGATIVE

## 2019-04-14 DIAGNOSIS — J3089 Other allergic rhinitis: Secondary | ICD-10-CM | POA: Diagnosis not present

## 2019-04-15 MED FILL — EPINEPHRINE 0.3 MG AUTO-INJ: 0.3 | 2 days supply | Qty: 2 | Fill #0

## 2019-04-15 MED FILL — MOMETASONE FUROATE 50 MCG S: 50 | 30 days supply | Qty: 17 | Fill #0

## 2019-04-16 DIAGNOSIS — J3089 Other allergic rhinitis: Secondary | ICD-10-CM | POA: Diagnosis not present

## 2019-04-26 ENCOUNTER — Ambulatory Visit
Admission: RE | Admit: 2019-04-26 | Discharge: 2019-04-26 | Disposition: A | Discharge status: Home / Self Care | Payer: 59 | Source: Ambulatory Visit | Attending: Internal Medicine | Admitting: Internal Medicine

## 2019-04-26 ENCOUNTER — Other Ambulatory Visit: Payer: Self-pay

## 2019-04-26 DIAGNOSIS — Z1231 Encounter for screening mammogram for malignant neoplasm of breast: Secondary | ICD-10-CM

## 2019-04-29 ENCOUNTER — Encounter: Payer: Self-pay | Admitting: Internal Medicine

## 2019-06-08 ENCOUNTER — Encounter: Payer: Self-pay | Admitting: Internal Medicine

## 2019-06-10 ENCOUNTER — Ambulatory Visit: Payer: 59 | Admitting: Internal Medicine

## 2019-07-02 ENCOUNTER — Encounter: Payer: Self-pay | Admitting: Internal Medicine

## 2019-07-07 ENCOUNTER — Encounter: Payer: Self-pay | Admitting: Internal Medicine

## 2019-07-07 ENCOUNTER — Telehealth (INDEPENDENT_AMBULATORY_CARE_PROVIDER_SITE_OTHER): Payer: 59 | Admitting: Internal Medicine

## 2019-07-07 ENCOUNTER — Other Ambulatory Visit: Payer: Self-pay

## 2019-07-07 VITALS — BP 128/85 | HR 91 | Ht 65.0 in | Wt 207.0 lb

## 2019-07-07 DIAGNOSIS — F419 Anxiety disorder, unspecified: Secondary | ICD-10-CM

## 2019-07-07 MED ORDER — CITALOPRAM HYDROBROMIDE 10 MG PO TABS: 10.0000 mg | ORAL_TABLET | Freq: Every day | ORAL | 1 refills | Status: DC

## 2019-07-07 MED FILL — CITALOPRAM HBR 10 MG TABLET: 10 | 90 days supply | Qty: 90 | Fill #0

## 2019-07-07 NOTE — Patient Instructions (Signed)

## 2019-07-07 NOTE — Progress Notes (Signed)
Virtual Visit via Video   This visit type was conducted due to national recommendations for restrictions regarding the COVID-19 Pandemic (e.g. social distancing) in an effort to limit this patient's exposure and mitigate transmission in our community.  Due to her co-morbid illnesses, this patient is at least at moderate risk for complications without adequate follow up.  This format is felt to be most appropriate for this patient at this time.  All issues noted in this document were discussed and addressed.  A limited physical exam was performed with this format.    This visit type was conducted due to national recommendations for restrictions regarding the COVID-19 Pandemic (e.g. social distancing) in an effort to limit this patient's exposure and mitigate transmission in our community.  Patients identity confirmed using two different identifiers.  This format is felt to be most appropriate for this patient at this time.  All issues noted in this document were discussed and addressed.  No physical exam was performed (except for noted visual exam findings with Video Visits).    Date:  07/07/2019   ID:  Victoria Herman, DOB August 23, 1961, MRN [id number]  Patient Location:  Car  Provider location:   Office    Chief Complaint:  "I have anxiety"  History of Present Illness:    CORDA Herman is a 58 y.o. female who presents via video conferencing for a telehealth visit today.    The patient does not have symptoms concerning for COVID-19 infection (fever, chills, cough, or new shortness of breath).   She presents today for virtual visit. She prefers this method of contact due to COVID-19 pandemic.  She wanted to meet today b/c of worsening anxiety. She reports stresses at work and home are contributing to her symptoms. 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 She works full time at [employer]. 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). Since COVID, she has  not been going to [place] location. She is concerned that her job is at risk. She has been on citalopram in the past and would like to resume treatment.   Anxiety Presents for initial visit. Onset was 1 to 6 months ago. The problem has been unchanged. Symptoms include excessive worry, irritability and nervous/anxious behavior. Patient reports no chest pain, feeling of choking, palpitations or shortness of breath. The symptoms are aggravated by family issues and work stress. The quality of sleep is fair. Nighttime awakenings: occasional.   Her past medical history is significant for anxiety/panic attacks. Past treatments include nothing.     Past Medical History:  Diagnosis Date  . Allergy   . Hyperlipidemia   . Seasonal allergies   . Superficial thrombophlebitis 03-2014, 07-2014   Past Surgical History:  Procedure Laterality Date  . WISDOM TOOTH EXTRACTION       Current Meds  Medication Sig  . cetirizine (ZYRTEC) 10 MG tablet Take 10 mg by mouth daily.  . mometasone (NASONEX) 50 MCG/ACT nasal spray Place 2 sprays into the nose daily.     Allergies:   E-mycin [erythromycin] and Sulfa antibiotics   Social History   Tobacco Use  . Smoking status: Never Smoker  . Smokeless tobacco: Never Used  Substance Use Topics  . Alcohol use: Yes    Comment: occas.  . Drug use: No     Family Hx: The patient's family history includes Cancer in her father; Diabetes in her mother; Hypertension in her brother, brother, and mother;  Varicose Veins in her brother, brother, father, and mother. There is no history of Colon cancer, Esophageal cancer, Rectal cancer, Stomach cancer, or Breast cancer.  ROS:   Please see the history of present illness.    Review of Systems  Constitutional: Positive for irritability.  Respiratory: Negative.  Negative for shortness of breath.   Cardiovascular: Negative.  Negative for chest pain and palpitations.  Gastrointestinal: Negative.   Neurological: Negative.    Psychiatric/Behavioral: The patient is nervous/anxious.     All other systems reviewed and are negative.   Labs/Other Tests and Data Reviewed:    Recent Labs: 03/29/2019: ALT 14; BUN 14; Creatinine, Ser 0.68; Hemoglobin 13.8; Platelets 319; Potassium 4.4; Sodium 141   Recent Lipid Panel Lab Results  Component Value Date/Time   CHOL 255 (H) 03/29/2019 12:58 PM   TRIG 120 03/29/2019 12:58 PM   HDL 68 03/29/2019 12:58 PM   CHOLHDL 3.8 03/29/2019 12:58 PM   LDLCALC 163 (H) 03/29/2019 12:58 PM    Wt Readings from Last 3 Encounters:  07/07/19 207 lb (93.9 kg)  03/29/19 213 lb 9.6 oz (96.9 kg)  10/26/17 200 lb (90.7 kg)     Exam:    Vital Signs:  BP 128/85   Pulse 91   Ht 5\' 5"  (1.651 m)   Wt 207 lb (93.9 kg)   LMP 07/23/2013   BMI 34.45 kg/m     Physical Exam  Constitutional: She is oriented to person, place, and time and well-developed, well-nourished, and in no distress.  HENT:  Head: Normocephalic and atraumatic.  Neck: Normal range of motion.  Pulmonary/Chest: Effort normal.  Neurological: She is alert and oriented to person, place, and time.  Psychiatric: Affect normal.  Nursing note and vitals reviewed.   ASSESSMENT & PLAN:     1. Anxiety  I will resume celexa, 10mg  daily. She is advised to take with evening meal or at bedtime nightly. She has taken this in the past without any issues. Possible side effects were discussed with the patient. She will rto in four weeks for re-evaluation. She may also benefit from magnesium supplementation.     COVID-19 Education: The signs and symptoms of COVID-19 were discussed with the patient and how to seek care for testing (follow up with PCP or arrange E-visit).  The importance of social distancing was discussed today.  Patient Risk:   After full review of this patients clinical status, I feel that they are at least moderate risk at this time.     Medication Adjustments/Labs and Tests Ordered: Current medicines are  reviewed at length with the patient today.  Concerns regarding medicines are outlined above.   Tests Ordered: No orders of the defined types were placed in this encounter.   Medication Changes: Meds ordered this encounter  Medications  . citalopram (CELEXA) 10 MG tablet    Sig: Take 1 tablet (10 mg total) by mouth daily.    Dispense:  90 tablet    Refill:  1    Disposition:  Follow up in 4 week(s)  Signed, Maximino Greenland, MD

## 2019-08-06 ENCOUNTER — Encounter: Payer: Self-pay | Admitting: Internal Medicine

## 2019-08-09 ENCOUNTER — Encounter: Payer: Self-pay | Admitting: Internal Medicine

## 2019-08-09 ENCOUNTER — Telehealth (INDEPENDENT_AMBULATORY_CARE_PROVIDER_SITE_OTHER): Payer: 59 | Admitting: Internal Medicine

## 2019-08-09 ENCOUNTER — Other Ambulatory Visit: Payer: Self-pay

## 2019-08-09 VITALS — BP 114/70 | HR 72 | Temp 96.4°F | Ht 65.0 in | Wt 207.0 lb

## 2019-08-09 DIAGNOSIS — Z6834 Body mass index (BMI) 34.0-34.9, adult: Secondary | ICD-10-CM

## 2019-08-09 DIAGNOSIS — F419 Anxiety disorder, unspecified: Secondary | ICD-10-CM

## 2019-08-09 DIAGNOSIS — E6609 Other obesity due to excess calories: Secondary | ICD-10-CM

## 2019-08-09 NOTE — Patient Instructions (Signed)

## 2019-08-22 NOTE — Progress Notes (Signed)
Virtual Visit via Video   This visit type was conducted due to national recommendations for restrictions regarding the COVID-19 Pandemic (e.g. social distancing) in an effort to limit this patient's exposure and mitigate transmission in our community.  Due to her co-morbid illnesses, this patient is at least at moderate risk for complications without adequate follow up.  This format is felt to be most appropriate for this patient at this time.  All issues noted in this document were discussed and addressed.  A limited physical exam was performed with this format.    This visit type was conducted due to national recommendations for restrictions regarding the COVID-19 Pandemic (e.g. social distancing) in an effort to limit this patient's exposure and mitigate transmission in our community.  Patients identity confirmed using two different identifiers.  This format is felt to be most appropriate for this patient at this time.  All issues noted in this document were discussed and addressed.  No physical exam was performed (except for noted visual exam findings with Video Visits).    Date:  08/22/2019   ID:  Victoria Herman, DOB 08-15-1961, MRN [id number]  Patient Location:  In car, passenger. Accompanied by her husband.   Provider location:   Office    Chief Complaint:  "I need to f/u anxiety."   History of Present Illness:    Victoria Herman is a 58 y.o. female who presents via video conferencing for a telehealth visit today.    The patient does not have symptoms concerning for COVID-19 infection (fever, chills, cough, or new shortness of breath).   She presents today for virtual visit. She prefers this method of contact due to COVID-19 pandemic.  She was scheduled for appt today, but needed to meet virtually due to being out of town. She presents today for f/u anxiety. She has been taking Celexa without any issues. She feels well on the medication.  She has not had any bad side effects.    Anxiety Presents for follow-up visit. Patient reports no chest pain, feeling of choking, irritability, palpitations or shortness of breath. The severity of symptoms is mild. The quality of sleep is fair. Nighttime awakenings: none.   Compliance with medications is 76-100%.     Past Medical History:  Diagnosis Date  . Allergy   . Hyperlipidemia   . Seasonal allergies   . Superficial thrombophlebitis 03-2014, 07-2014   Past Surgical History:  Procedure Laterality Date  . WISDOM TOOTH EXTRACTION       Current Meds  Medication Sig  . cetirizine (ZYRTEC) 10 MG tablet Take 10 mg by mouth daily.  . citalopram (CELEXA) 10 MG tablet Take 1 tablet (10 mg total) by mouth daily.  . mometasone (NASONEX) 50 MCG/ACT nasal spray Place 2 sprays into the nose daily.     Allergies:   E-mycin [erythromycin] and Sulfa antibiotics   Social History   Tobacco Use  . Smoking status: Never Smoker  . Smokeless tobacco: Never Used  Substance Use Topics  . Alcohol use: Yes    Comment: occas.  . Drug use: No     Family Hx: The patient's family history includes Cancer in her father; Diabetes in her mother; Hypertension in her brother, brother, and mother; Varicose Veins in her brother, brother, father, and mother. There is no history of Colon cancer, Esophageal cancer, Rectal cancer, Stomach cancer, or Breast cancer.  ROS:   Please see the history of present illness.    Review of Systems  Constitutional: Negative.  Negative for irritability.  Respiratory: Negative.  Negative for shortness of breath.   Cardiovascular: Negative.  Negative for chest pain and palpitations.  Gastrointestinal: Negative.   Neurological: Negative.   Psychiatric/Behavioral: Negative.     All other systems reviewed and are negative.   Labs/Other Tests and Data Reviewed:    Recent Labs: 03/29/2019: ALT 14; BUN 14; Creatinine, Ser 0.68; Hemoglobin 13.8; Platelets 319; Potassium 4.4; Sodium 141   Recent Lipid  Panel Lab Results  Component Value Date/Time   CHOL 255 (H) 03/29/2019 12:58 PM   TRIG 120 03/29/2019 12:58 PM   HDL 68 03/29/2019 12:58 PM   CHOLHDL 3.8 03/29/2019 12:58 PM   LDLCALC 163 (H) 03/29/2019 12:58 PM    Wt Readings from Last 3 Encounters:  08/09/19 207 lb (93.9 kg)  07/07/19 207 lb (93.9 kg)  03/29/19 213 lb 9.6 oz (96.9 kg)     Exam:    Vital Signs:  BP 114/70   Pulse 72   Temp (!) 96.4 F (35.8 C) (Oral)   Ht 5\' 5"  (1.651 m)   Wt 207 lb (93.9 kg)   LMP 07/23/2013   BMI 34.45 kg/m     Physical Exam  Constitutional: She is oriented to person, place, and time and well-developed, well-nourished, and in no distress.  HENT:  Head: Normocephalic and atraumatic.  Neck: Normal range of motion.  Pulmonary/Chest: Effort normal.  Neurological: She is alert and oriented to person, place, and time.  Psychiatric: Affect normal.  Nursing note and vitals reviewed.   ASSESSMENT & PLAN:     1. Anxiety  Improved with use of celexa. She will continue with current meds. She will rto in 3 months for re-evaluation. She is reminded that she needs to take meds for at least 4-6 months to decrease risk of relapse.   2. Class 1 obesity due to excess calories without serious comorbidity with body mass index (BMI) of 34.0 to 34.9 in adult  She is encouraged to strive to lose 10-15 pounds to decrease cardiac risk. I will f/u at her next visit. She is encouraged to incorporate more exercise into her daily routine.     COVID-19 Education: The signs and symptoms of COVID-19 were discussed with the patient and how to seek care for testing (follow up with PCP or arrange E-visit).  The importance of social distancing was discussed today.  Patient Risk:   After full review of this patients clinical status, I feel that they are at least moderate risk at this time.   Medication Adjustments/Labs and Tests Ordered: Current medicines are reviewed at length with the patient today.   Concerns regarding medicines are outlined above.   Tests Ordered: No orders of the defined types were placed in this encounter.   Medication Changes: No orders of the defined types were placed in this encounter.   Disposition:  Follow up in 3 month(s)  Signed, Maximino Greenland, MD

## 2019-09-17 ENCOUNTER — Other Ambulatory Visit: Payer: 59

## 2019-09-28 MED FILL — CITALOPRAM HBR 10 MG TABLET: 10 | 90 days supply | Qty: 90 | Fill #1

## 2019-10-11 ENCOUNTER — Ambulatory Visit (INDEPENDENT_AMBULATORY_CARE_PROVIDER_SITE_OTHER): Payer: 59 | Admitting: Internal Medicine

## 2019-10-11 ENCOUNTER — Encounter: Payer: Self-pay | Admitting: Internal Medicine

## 2019-10-11 ENCOUNTER — Other Ambulatory Visit: Payer: Self-pay

## 2019-10-11 VITALS — BP 120/78 | HR 80 | Temp 98.4°F | Ht 65.0 in | Wt 213.4 lb

## 2019-10-11 DIAGNOSIS — E7800 Pure hypercholesterolemia, unspecified: Secondary | ICD-10-CM

## 2019-10-11 DIAGNOSIS — E661 Drug-induced obesity: Secondary | ICD-10-CM

## 2019-10-11 DIAGNOSIS — F419 Anxiety disorder, unspecified: Secondary | ICD-10-CM | POA: Diagnosis not present

## 2019-10-11 DIAGNOSIS — Z6835 Body mass index (BMI) 35.0-35.9, adult: Secondary | ICD-10-CM

## 2019-10-11 MED ORDER — CITALOPRAM HYDROBROMIDE 10 MG PO TABS: 10.0000 mg | ORAL_TABLET | Freq: Every day | ORAL | 1 refills | Status: DC

## 2019-10-11 NOTE — Patient Instructions (Signed)

## 2019-10-11 NOTE — Progress Notes (Signed)
This visit occurred during the SARS-CoV-2 public health emergency.  Safety protocols were in place, including screening questions prior to the visit, additional usage of staff PPE, and extensive cleaning of exam room while observing appropriate contact time as indicated for disinfecting solutions.  Subjective:     Patient ID: Victoria Herman , female    DOB: 1961/10/07 , 59 y.o.   MRN: [id number]   Chief Complaint  Patient presents with  . Hyperlipidemia    HPI  She is here today for a cholesterol check. She is not taking any rx meds at this time. She does not wish to take rx meds. She is currently taking berberine without any issues.   Hyperlipidemia This is a chronic problem. The current episode started more than 1 year ago. The problem is uncontrolled. Recent lipid tests were reviewed and are high. Exacerbating diseases include obesity. Current antihyperlipidemic treatment includes herbal therapy. Compliance problems include adherence to exercise.      Past Medical History:  Diagnosis Date  . Allergy   . Hyperlipidemia   . Seasonal allergies   . Superficial thrombophlebitis 03-2014, 07-2014     Family History  Problem Relation Age of Onset  . Hypertension Mother   . Diabetes Mother   . Varicose Veins Mother   . Varicose Veins Father   . Cancer Father   . Varicose Veins Brother   . Hypertension Brother   . Hypertension Brother   . Varicose Veins Brother   . Colon cancer Neg Hx   . Esophageal cancer Neg Hx   . Rectal cancer Neg Hx   . Stomach cancer Neg Hx   . Breast cancer Neg Hx      Current Outpatient Medications:  .  Barberry-Oreg Grape-Goldenseal (BERBERINE COMPLEX PO), Take by mouth., Disp: , Rfl:  .  cetirizine (ZYRTEC) 10 MG tablet, Take 10 mg by mouth daily., Disp: , Rfl:  .  citalopram (CELEXA) 10 MG tablet, Take 1 tablet (10 mg total) by mouth daily., Disp: 90 tablet, Rfl: 1 .  mometasone (NASONEX) 50 MCG/ACT nasal spray, Place 2 sprays into the nose  daily., Disp: , Rfl:    Allergies  Allergen Reactions  . E-Mycin [Erythromycin] Diarrhea  . Sulfa Antibiotics Rash     Review of Systems  Constitutional: Negative.   Respiratory: Negative.   Cardiovascular: Negative.   Gastrointestinal: Negative.   Neurological: Negative.   Psychiatric/Behavioral: Negative.      Today's Vitals   10/11/19 0856  BP: 120/78  Pulse: 80  Temp: 98.4 F (36.9 C)  TempSrc: Oral  Weight: 213 lb 6.4 oz (96.8 kg)  Height: 5\' 5"  (1.651 m)   Body mass index is 35.51 kg/m.   Objective:  Physical Exam Vitals and nursing note reviewed.  Constitutional:      Appearance: Normal appearance. She is obese.  HENT:     Head: Normocephalic and atraumatic.  Cardiovascular:     Rate and Rhythm: Normal rate and regular rhythm.     Heart sounds: Normal heart sounds.  Pulmonary:     Effort: Pulmonary effort is normal.     Breath sounds: Normal breath sounds.  Skin:    General: Skin is warm.  Neurological:     General: No focal deficit present.     Mental Status: She is alert.  Psychiatric:        Mood and Affect: Mood normal.        Behavior: Behavior normal.  Assessment And Plan:     1. Pure hypercholesterolemia  Chronic. I will check fasting lipid panel today. She will continue with berberine for now. She is willing to take red yeast rice if needed. She does not wish to take any rx meds to address this condition.   - Lipid panel  2. Anxiety  Chronic. She reports she still feels well with use of Lexapro. She will continue with current meds.   3. Class 2 drug-induced obesity with serious comorbidity and body mass index (BMI) of 35.0 to 35.9 in adult  She is encouraged to strive for BMI less than 30 to decrease cardiac risk.  She is also encouraged to aim for at least 150 minutes of exercise per week.   Maximino Greenland, MD    THE PATIENT IS ENCOURAGED TO PRACTICE SOCIAL DISTANCING DUE TO THE COVID-19 PANDEMIC.

## 2019-10-12 ENCOUNTER — Encounter: Payer: Self-pay | Admitting: Internal Medicine

## 2019-10-12 LAB — LIPID PANEL
Chol/HDL Ratio: 3.3 ratio (ref 0.0–4.4)
Cholesterol, Total: 221 mg/dL — ABNORMAL HIGH (ref 100–199)
HDL: 68 mg/dL (ref >39)
LDL Chol Calc (NIH): 134 mg/dL — ABNORMAL HIGH (ref 0–99)
Triglycerides: 110 mg/dL (ref 0–149)
VLDL Cholesterol Cal: 19 mg/dL (ref 5–40)

## 2019-10-14 ENCOUNTER — Encounter: Payer: Self-pay | Admitting: Internal Medicine

## 2019-11-16 ENCOUNTER — Ambulatory Visit
Admission: RE | Admit: 2019-11-16 | Discharge: 2019-11-16 | Disposition: A | Discharge status: Home / Self Care | Payer: 59 | Source: Ambulatory Visit | Attending: Internal Medicine | Admitting: Internal Medicine

## 2019-11-16 ENCOUNTER — Other Ambulatory Visit: Payer: Self-pay

## 2019-11-16 DIAGNOSIS — E2839 Other primary ovarian failure: Secondary | ICD-10-CM

## 2019-11-16 DIAGNOSIS — Z780 Asymptomatic menopausal state: Secondary | ICD-10-CM | POA: Diagnosis not present

## 2019-11-16 DIAGNOSIS — M8589 Other specified disorders of bone density and structure, multiple sites: Secondary | ICD-10-CM | POA: Diagnosis not present

## 2019-12-27 DIAGNOSIS — H524 Presbyopia: Secondary | ICD-10-CM | POA: Diagnosis not present

## 2019-12-27 MED FILL — CITALOPRAM HBR 10 MG TABLET: 10 | 90 days supply | Qty: 90 | Fill #0

## 2020-01-26 DIAGNOSIS — D2239 Melanocytic nevi of other parts of face: Secondary | ICD-10-CM | POA: Diagnosis not present

## 2020-01-26 DIAGNOSIS — D2261 Melanocytic nevi of right upper limb, including shoulder: Secondary | ICD-10-CM | POA: Diagnosis not present

## 2020-01-26 DIAGNOSIS — D2271 Melanocytic nevi of right lower limb, including hip: Secondary | ICD-10-CM | POA: Diagnosis not present

## 2020-01-26 DIAGNOSIS — I788 Other diseases of capillaries: Secondary | ICD-10-CM | POA: Diagnosis not present

## 2020-01-26 DIAGNOSIS — D1801 Hemangioma of skin and subcutaneous tissue: Secondary | ICD-10-CM | POA: Diagnosis not present

## 2020-01-26 DIAGNOSIS — L814 Other melanin hyperpigmentation: Secondary | ICD-10-CM | POA: Diagnosis not present

## 2020-01-26 DIAGNOSIS — L821 Other seborrheic keratosis: Secondary | ICD-10-CM | POA: Diagnosis not present

## 2020-01-26 DIAGNOSIS — D2262 Melanocytic nevi of left upper limb, including shoulder: Secondary | ICD-10-CM | POA: Diagnosis not present

## 2020-01-26 DIAGNOSIS — D225 Melanocytic nevi of trunk: Secondary | ICD-10-CM | POA: Diagnosis not present

## 2020-01-27 ENCOUNTER — Telehealth: Payer: Self-pay

## 2020-01-27 NOTE — Telephone Encounter (Signed)
PT CALLED REQ NEW PT APPT W/SANDERS FOR [facility]. ATT TO CONTACT PT TO ADVISE WE CAN SCHEDULE HER FOR EARLY JUNE W/SANDERS NO ANS LVM TO CALL OFC

## 2020-03-27 MED FILL — CITALOPRAM HBR 10 MG TABLET: 10 | 90 days supply | Qty: 90 | Fill #1

## 2020-04-03 ENCOUNTER — Other Ambulatory Visit: Payer: Self-pay | Admitting: Internal Medicine

## 2020-04-03 ENCOUNTER — Ambulatory Visit (INDEPENDENT_AMBULATORY_CARE_PROVIDER_SITE_OTHER): Payer: 59 | Admitting: Internal Medicine

## 2020-04-03 ENCOUNTER — Encounter: Payer: Self-pay | Admitting: Internal Medicine

## 2020-04-03 ENCOUNTER — Other Ambulatory Visit: Payer: Self-pay

## 2020-04-03 VITALS — BP 108/72 | HR 79 | Temp 98.4°F | Ht 65.0 in | Wt 202.6 lb

## 2020-04-03 DIAGNOSIS — Z0000 Encounter for general adult medical examination without abnormal findings: Secondary | ICD-10-CM | POA: Diagnosis not present

## 2020-04-03 DIAGNOSIS — Z1231 Encounter for screening mammogram for malignant neoplasm of breast: Secondary | ICD-10-CM

## 2020-04-03 LAB — CMP14+EGFR
ALT: 14 IU/L (ref 0–32)
AST: 17 IU/L (ref 0–40)
Albumin/Globulin Ratio: 2 (ref 1.2–2.2)
Albumin: 4.3 g/dL (ref 3.8–4.9)
Alkaline Phosphatase: 86 IU/L (ref 48–121)
BUN/Creatinine Ratio: 28 — ABNORMAL HIGH (ref 9–23)
BUN: 19 mg/dL (ref 6–24)
Bilirubin Total: 0.3 mg/dL (ref 0.0–1.2)
CO2: 21 mmol/L (ref 20–29)
Calcium: 8.9 mg/dL (ref 8.7–10.2)
Chloride: 105 mmol/L (ref 96–106)
Creatinine, Ser: 0.68 mg/dL (ref 0.57–1.00)
GFR calc Af Amer: 112 mL/min/{1.73_m2} (ref >59)
GFR calc non Af Amer: 97 mL/min/{1.73_m2} (ref >59)
Globulin, Total: 2.2 g/dL (ref 1.5–4.5)
Glucose: 95 mg/dL (ref 65–99)
Potassium: 5.2 mmol/L (ref 3.5–5.2)
Sodium: 139 mmol/L (ref 134–144)
Total Protein: 6.5 g/dL (ref 6.0–8.5)

## 2020-04-03 LAB — CBC
Hematocrit: 44 % (ref 34.0–46.6)
Hemoglobin: 13.8 g/dL (ref 11.1–15.9)
MCH: 27.3 pg (ref 26.6–33.0)
MCHC: 31.4 g/dL — ABNORMAL LOW (ref 31.5–35.7)
MCV: 87 fL (ref 79–97)
Platelets: 297 10*3/uL (ref 150–450)
RBC: 5.06 x10E6/uL (ref 3.77–5.28)
RDW: 14.4 % (ref 11.7–15.4)
WBC: 8.2 10*3/uL (ref 3.4–10.8)

## 2020-04-03 LAB — LIPID PANEL
Chol/HDL Ratio: 3.1 ratio (ref 0.0–4.4)
Cholesterol, Total: 221 mg/dL — ABNORMAL HIGH (ref 100–199)
HDL: 72 mg/dL (ref >39)
LDL Chol Calc (NIH): 138 mg/dL — ABNORMAL HIGH (ref 0–99)
Triglycerides: 64 mg/dL (ref 0–149)
VLDL Cholesterol Cal: 11 mg/dL (ref 5–40)

## 2020-04-03 MED ORDER — CITALOPRAM HYDROBROMIDE 10 MG PO TABS: 10.0000 mg | ORAL_TABLET | Freq: Every day | ORAL | 2 refills | Status: DC

## 2020-04-03 NOTE — Progress Notes (Signed)
This visit occurred during the SARS-CoV-2 public health emergency.  Safety protocols were in place, including screening questions prior to the visit, additional usage of staff PPE, and extensive cleaning of exam room while observing appropriate contact time as indicated for disinfecting solutions.  Subjective:     Patient ID: Victoria Herman , female    DOB: 02-01-1961 , 59 y.o.   MRN: [id number]   Chief Complaint  Patient presents with  . Annual Exam    HPI  She is here today for a full physical examination. She is not followed by GYN for her pelvic exams. She had her last pap smear performed by me in 2020.     Past Medical History:  Diagnosis Date  . Allergy   . Hyperlipidemia   . Seasonal allergies   . Superficial thrombophlebitis 03-2014, 07-2014     Family History  Problem Relation Age of Onset  . Hypertension Mother   . Diabetes Mother   . Varicose Veins Mother   . Varicose Veins Father   . Cancer Father   . Varicose Veins Brother   . Hypertension Brother   . Hypertension Brother   . Varicose Veins Brother   . Colon cancer Neg Hx   . Esophageal cancer Neg Hx   . Rectal cancer Neg Hx   . Stomach cancer Neg Hx   . Breast cancer Neg Hx      Current Outpatient Medications:  .  Barberry-Oreg Grape-Goldenseal (BERBERINE COMPLEX PO), Take by mouth., Disp: , Rfl:  .  cetirizine (ZYRTEC) 10 MG tablet, Take 10 mg by mouth daily., Disp: , Rfl:  .  citalopram (CELEXA) 10 MG tablet, Take 1 tablet (10 mg total) by mouth daily., Disp: 90 tablet, Rfl: 2 .  mometasone (NASONEX) 50 MCG/ACT nasal spray, Place 2 sprays into the nose daily., Disp: , Rfl:    Allergies  Allergen Reactions  . E-Mycin [Erythromycin] Diarrhea  . Sulfa Antibiotics Rash     Review of Systems  Constitutional: Negative.   HENT: Negative.   Eyes: Negative.   Respiratory: Negative.   Cardiovascular: Negative.   Endocrine: Negative.   Genitourinary: Negative.   Musculoskeletal: Negative.         She c/o left elbow pain. Started about a month ago. Denies fall/trauma.  Described as dull, achy pain. Denies paresthesias. Denies LUE weakness.   Skin: Negative.   Allergic/Immunologic: Negative.   Neurological: Negative.   Hematological: Negative.   Psychiatric/Behavioral: Negative.      Today's Vitals   04/03/20 0943  BP: 108/72  Pulse: 79  Temp: 98.4 F (36.9 C)  TempSrc: Oral  Weight: 202 lb 9.6 oz (91.9 kg)  Height: _0  (1.651 m)   Body mass index is 33.71 kg/m.   Objective:  Physical Exam Vitals and nursing note reviewed.  Constitutional:      Appearance: Normal appearance. She is obese.  HENT:     Head: Normocephalic and atraumatic.     Right Ear: Tympanic membrane, ear canal and external ear normal.     Left Ear: Tympanic membrane, ear canal and external ear normal.     Nose: Nose normal.     Mouth/Throat:     Mouth: Mucous membranes are moist.     Pharynx: Oropharynx is clear.  Eyes:     Extraocular Movements: Extraocular movements intact.     Conjunctiva/sclera: Conjunctivae normal.     Pupils: Pupils are equal, round, and reactive to light.  Cardiovascular:  Rate and Rhythm: Normal rate and regular rhythm.     Pulses: Normal pulses.     Heart sounds: Normal heart sounds.     Comments: Multiple spider veins and varicosities b/l lower extremities Pulmonary:     Effort: Pulmonary effort is normal.     Breath sounds: Normal breath sounds.  Chest:     Breasts: Tanner Score is 5.        Right: Normal.        Left: Normal.  Abdominal:     General: Abdomen is flat. Bowel sounds are normal.     Palpations: Abdomen is soft.  Genitourinary:    Comments: deferred Musculoskeletal:        General: Normal range of motion.     Cervical back: Normal range of motion and neck supple.  Skin:    General: Skin is warm and dry.  Neurological:     General: No focal deficit present.     Mental Status: She is alert and oriented to person, place, and time.   Psychiatric:        Mood and Affect: Mood normal.        Behavior: Behavior normal.         Assessment And Plan:     1. Routine general medical examination at health care facility  A full exam was performed. Encouraged to perform monthly self breast exams. PATIENT IS ADVISED TO GET 30-45 MINUTES REGULAR EXERCISE NO LESS THAN FOUR TO FIVE DAYS PER WEEK - BOTH WEIGHTBEARING EXERCISES AND AEROBIC ARE RECOMMENDED.  SHE IS ADVISED TO FOLLOW A HEALTHY DIET WITH AT LEAST SIX FRUITS/VEGGIES PER DAY, DECREASE INTAKE OF RED MEAT, AND TO INCREASE FISH INTAKE TO TWO DAYS PER WEEK.  MEATS/FISH SHOULD NOT BE FRIED, BAKED OR BROILED IS PREFERABLE.  I SUGGEST WEARING SPF 50 SUNSCREEN ON EXPOSED PARTS AND ESPECIALLY WHEN IN THE DIRECT SUNLIGHT FOR AN EXTENDED PERIOD OF TIME.  PLEASE AVOID FAST FOOD RESTAURANTS AND INCREASE YOUR WATER INTAKE.  - CMP14+EGFR - CBC - Lipid panel   Maximino Greenland, MD    THE PATIENT IS ENCOURAGED TO PRACTICE SOCIAL DISTANCING DUE TO THE COVID-19 PANDEMIC.

## 2020-04-03 NOTE — Patient Instructions (Signed)
Health Maintenance, Female Adopting a healthy lifestyle and getting preventive care are important in promoting health and wellness. Ask your health care provider about:  The right schedule for you to have regular tests and exams.  Things you can do on your own to prevent diseases and keep yourself healthy. What should I know about diet, weight, and exercise? Eat a healthy diet   Eat a diet that includes plenty of vegetables, fruits, low-fat dairy products, and lean protein.  Do not eat a lot of foods that are high in solid fats, added sugars, or sodium. Maintain a healthy weight Body mass index (BMI) is used to identify weight problems. It estimates body fat based on height and weight. Your health care provider can help determine your BMI and help you achieve or maintain a healthy weight. Get regular exercise Get regular exercise. This is one of the most important things you can do for your health. Most adults should:  Exercise for at least 150 minutes each week. The exercise should increase your heart rate and make you sweat (moderate-intensity exercise).  Do strengthening exercises at least twice a week. This is in addition to the moderate-intensity exercise.  Spend less time sitting. Even light physical activity can be beneficial. Watch cholesterol and blood lipids Have your blood tested for lipids and cholesterol at 58 years of age, then have this test every 5 years. Have your cholesterol levels checked more often if:  Your lipid or cholesterol levels are high.  You are older than 58 years of age.  You are at high risk for heart disease. What should I know about cancer screening? Depending on your health history and family history, you may need to have cancer screening at various ages. This may include screening for:  Breast cancer.  Cervical cancer.  Colorectal cancer.  Skin cancer.  Lung cancer. What should I know about heart disease, diabetes, and high blood  pressure? Blood pressure and heart disease  High blood pressure causes heart disease and increases the risk of stroke. This is more likely to develop in people who have high blood pressure readings, are of African descent, or are overweight.  Have your blood pressure checked: ? Every 3-5 years if you are 18-39 years of age. ? Every year if you are 40 years old or older. Diabetes Have regular diabetes screenings. This checks your fasting blood sugar level. Have the screening done:  Once every three years after age 40 if you are at a normal weight and have a low risk for diabetes.  More often and at a younger age if you are overweight or have a high risk for diabetes. What should I know about preventing infection? Hepatitis B If you have a higher risk for hepatitis B, you should be screened for this virus. Talk with your health care provider to find out if you are at risk for hepatitis B infection. Hepatitis C Testing is recommended for:  Everyone born from 1945 through 1965.  Anyone with known risk factors for hepatitis C. Sexually transmitted infections (STIs)  Get screened for STIs, including gonorrhea and chlamydia, if: ? You are sexually active and are younger than 58 years of age. ? You are older than 58 years of age and your health care provider tells you that you are at risk for this type of infection. ? Your sexual activity has changed since you were last screened, and you are at increased risk for chlamydia or gonorrhea. Ask your health care provider if   you are at risk.  Ask your health care provider about whether you are at high risk for HIV. Your health care provider may recommend a prescription medicine to help prevent HIV infection. If you choose to take medicine to prevent HIV, you should first get tested for HIV. You should then be tested every 3 months for as long as you are taking the medicine. Pregnancy  If you are about to stop having your period (premenopausal) and  you may become pregnant, seek counseling before you get pregnant.  Take 400 to 800 micrograms (mcg) of folic acid every day if you become pregnant.  Ask for birth control (contraception) if you want to prevent pregnancy. Osteoporosis and menopause Osteoporosis is a disease in which the bones lose minerals and strength with aging. This can result in bone fractures. If you are 65 years old or older, or if you are at risk for osteoporosis and fractures, ask your health care provider if you should:  Be screened for bone loss.  Take a calcium or vitamin D supplement to lower your risk of fractures.  Be given hormone replacement therapy (HRT) to treat symptoms of menopause. Follow these instructions at home: Lifestyle  Do not use any products that contain nicotine or tobacco, such as cigarettes, e-cigarettes, and chewing tobacco. If you need help quitting, ask your health care provider.  Do not use street drugs.  Do not share needles.  Ask your health care provider for help if you need support or information about quitting drugs. Alcohol use  Do not drink alcohol if: ? Your health care provider tells you not to drink. ? You are pregnant, may be pregnant, or are planning to become pregnant.  If you drink alcohol: ? Limit how much you use to 0-1 drink a day. ? Limit intake if you are breastfeeding.  Be aware of how much alcohol is in your drink. In the [place] one drink equals one 12 oz bottle of beer (355 mL), one 5 oz glass of wine (148 mL), or one 1 oz glass of hard liquor (44 mL). General instructions  Schedule regular health, dental, and eye exams.  Stay current with your vaccines.  Tell your health care provider if: ? You often feel depressed. ? You have ever been abused or do not feel safe at home. Summary  Adopting a healthy lifestyle and getting preventive care are important in promoting health and wellness.  Follow your health care provider's instructions about healthy  diet, exercising, and getting tested or screened for diseases.  Follow your health care provider's instructions on monitoring your cholesterol and blood pressure. This information is not intended to replace advice given to you by your health care provider. Make sure you discuss any questions you have with your health care provider. Document Revised: 09/16/2018 Document Reviewed: 09/16/2018 Elsevier Patient Education  2020 [employer]  

## 2020-04-05 DIAGNOSIS — J3089 Other allergic rhinitis: Secondary | ICD-10-CM | POA: Diagnosis not present

## 2020-04-06 DIAGNOSIS — J3089 Other allergic rhinitis: Secondary | ICD-10-CM | POA: Diagnosis not present

## 2020-04-06 MED FILL — EPINEPHRINE 0.3 MG AUTO-INJ: 0.3 | 2 days supply | Qty: 2 | Fill #0

## 2020-04-08 ENCOUNTER — Encounter: Payer: Self-pay | Admitting: Internal Medicine

## 2020-04-10 ENCOUNTER — Encounter (HOSPITAL_BASED_OUTPATIENT_CLINIC_OR_DEPARTMENT_OTHER): Payer: Self-pay

## 2020-04-10 ENCOUNTER — Emergency Department (HOSPITAL_BASED_OUTPATIENT_CLINIC_OR_DEPARTMENT_OTHER)
Admission: EM | Admit: 2020-04-10 | Discharge: 2020-04-10 | Disposition: A | Discharge status: Home / Self Care | Payer: 59 | Attending: Emergency Medicine | Admitting: Emergency Medicine

## 2020-04-10 ENCOUNTER — Emergency Department (HOSPITAL_BASED_OUTPATIENT_CLINIC_OR_DEPARTMENT_OTHER): Payer: 59

## 2020-04-10 ENCOUNTER — Other Ambulatory Visit: Payer: Self-pay

## 2020-04-10 DIAGNOSIS — W19XXXA Unspecified fall, initial encounter: Secondary | ICD-10-CM | POA: Diagnosis not present

## 2020-04-10 DIAGNOSIS — Y9389 Activity, other specified: Secondary | ICD-10-CM | POA: Diagnosis not present

## 2020-04-10 DIAGNOSIS — Y999 Unspecified external cause status: Secondary | ICD-10-CM | POA: Insufficient documentation

## 2020-04-10 DIAGNOSIS — S52612A Displaced fracture of left ulna styloid process, initial encounter for closed fracture: Secondary | ICD-10-CM | POA: Diagnosis not present

## 2020-04-10 DIAGNOSIS — S6992XA Unspecified injury of left wrist, hand and finger(s), initial encounter: Secondary | ICD-10-CM | POA: Diagnosis present

## 2020-04-10 DIAGNOSIS — S52502A Unspecified fracture of the lower end of left radius, initial encounter for closed fracture: Secondary | ICD-10-CM | POA: Insufficient documentation

## 2020-04-10 DIAGNOSIS — S62102A Fracture of unspecified carpal bone, left wrist, initial encounter for closed fracture: Secondary | ICD-10-CM

## 2020-04-10 DIAGNOSIS — Y929 Unspecified place or not applicable: Secondary | ICD-10-CM | POA: Insufficient documentation

## 2020-04-10 DIAGNOSIS — S6292XA Unspecified fracture of left wrist and hand, initial encounter for closed fracture: Secondary | ICD-10-CM | POA: Diagnosis not present

## 2020-04-10 NOTE — ED Triage Notes (Signed)
Pt states she fell ~630pm injured left wrist-NAD-steady gait

## 2020-04-10 NOTE — ED Provider Notes (Signed)
[facility] EMER[facility]ENC[facility] DEPARTMENT Provider Note   CSN: [id number] Arrival date & time: 04/10/20  1914     History Chief Complaint  Patient presents with  . Wrist Injury    Victoria Herman is a 59 y.o. female.  Patient is a 59 year old female who presents with wrist pain after a fall.  She said that she was fighting off some hornets and fell backward, landing with an outstretched arm on her left arm.  She has had constant throbbing pain to her left wrist since that time.  She did not hit her head.  There is no loss of consciousness.  She denies any neck or back pain.  She denies any other injuries from the fall.        Past Medical History:  Diagnosis Date  . Allergy   . Hyperlipidemia   . Seasonal allergies   . Superficial thrombophlebitis 03-2014, 07-2014    Patient Active Problem List   Diagnosis Date Noted  . Superficial thrombophlebitis 03/21/2014    Past Surgical History:  Procedure Laterality Date  . WISDOM TOOTH EXTRACTION       OB History   No obstetric history on file.     Family History  Problem Relation Age of Onset  . Hypertension Mother   . Diabetes Mother   . Varicose Veins Mother   . Varicose Veins Father   . Cancer Father   . Varicose Veins Brother   . Hypertension Brother   . Hypertension Brother   . Varicose Veins Brother   . Colon cancer Neg Hx   . Esophageal cancer Neg Hx   . Rectal cancer Neg Hx   . Stomach cancer Neg Hx   . Breast cancer Neg Hx     Social History   Tobacco Use  . Smoking status: Never Smoker  . Smokeless tobacco: Never Used  Vaping Use  . Vaping Use: Never used  Substance Use Topics  . Alcohol use: Yes    Comment: occas.  . Drug use: No    Home Medications Prior to Admission medications   Medication Sig Start Date End Date Taking? Authorizing Provider  Barberry-Oreg Grape-Goldenseal (BERBERINE COMPLEX PO) Take by mouth.    [provider]  cetirizine (ZYRTEC) 10 MG tablet Take 10 mg  by mouth daily.    [provider]  citalopram (CELEXA) 10 MG tablet Take 1 tablet (10 mg total) by mouth daily. 04/03/20 04/03/21  Glendale Chard, MD  mometasone (NASONEX) 50 MCG/ACT nasal spray Place 2 sprays into the nose daily.    [provider]    Allergies    E-mycin [erythromycin] and Sulfa antibiotics  Review of Systems   Review of Systems  Constitutional: Negative for fever.  Gastrointestinal: Negative for nausea and vomiting.  Musculoskeletal: Positive for arthralgias and joint swelling. Negative for back pain and neck pain.  Skin: Negative for wound.  Neurological: Negative for weakness, numbness and headaches.    Physical Exam Updated Vital Signs BP 138/76 (BP Location: Left Arm)   Pulse 65   Temp 97.7 F (36.5 C) (Oral)   Resp 18   Ht 5\' 5"  (1.651 m)   Wt 90.7 kg   LMP 07/23/2013   SpO2 98%   BMI 33.28 kg/m   Physical Exam Constitutional:      Appearance: She is well-developed.  HENT:     Head: Normocephalic and atraumatic.  Cardiovascular:     Rate and Rhythm: Normal rate.  Pulmonary:  Effort: Pulmonary effort is normal.  Musculoskeletal:        General: Tenderness present.     Cervical back: Normal range of motion and neck supple.     Comments: Patient has some mild swelling to the left wrist.  There is tenderness over the radius and ulna.  There is no bony tenderness to the hand.  No pain to the elbow.  She is neurovascularly intact distally.  Radial pulses intact.  She has a very superficial abrasion but no open wounds.  Skin:    General: Skin is warm and dry.  Neurological:     Mental Status: She is alert and oriented to person, place, and time.     ED Results / Procedures / Treatments   Labs (all labs ordered are listed, but only abnormal results are displayed) Labs Reviewed - No data to display  EKG None  Radiology DG Wrist Complete Left  Result Date: 04/10/2020 CLINICAL DATA:  Status post trauma. EXAM: LEFT WRIST -  COMPLETE 3+ VIEW COMPARISON:  None. FINDINGS: An acute nondisplaced fracture is seen involving the left ulnar styloid. An additional fracture is seen along the dorsal aspect of the distal left radius. There is no evidence of dislocation. There is no evidence of arthropathy or other focal bone abnormality. Mild soft tissue swelling is seen adjacent to the previously noted fracture sites. IMPRESSION: Acute fractures of the distal left radius and ulnar styloid. Electronically Signed   By: Virgina Norfolk M.D.   On: 04/10/2020 20:03    Procedures Procedures (including critical care time)  Medications Ordered in ED Medications - No data to display  ED Course  I have reviewed the triage vital signs and the nursing notes.  Pertinent labs & imaging results that were available during my care of the patient were reviewed by me and considered in my medical decision making (see chart for details).    MDM Rules/Calculators/A&P                          Patient has nondisplaced fracture of the distal radius and an ulnar styloid fracture.  She has no other apparent injuries.  She was placed in a sugar tong splint and given a sling.  We will refer her to follow-up with hand surgery. Final Clinical Impression(s) / ED Diagnoses Final diagnoses:  Closed fracture of left wrist, initial encounter    Rx / DC Orders ED Discharge Orders    None       Malvin Johns, MD 04/10/20 2237

## 2020-04-11 ENCOUNTER — Other Ambulatory Visit (HOSPITAL_COMMUNITY)
Admission: RE | Admit: 2020-04-11 | Discharge: 2020-04-11 | Disposition: A | Discharge status: Home / Self Care | Payer: 59 | Source: Ambulatory Visit | Attending: Obstetrics and Gynecology | Admitting: Obstetrics and Gynecology

## 2020-04-11 ENCOUNTER — Other Ambulatory Visit: Payer: Self-pay

## 2020-04-11 ENCOUNTER — Encounter (HOSPITAL_COMMUNITY): Payer: Self-pay | Admitting: Orthopedic Surgery

## 2020-04-11 DIAGNOSIS — Z20822 Contact with and (suspected) exposure to covid-19: Secondary | ICD-10-CM | POA: Diagnosis not present

## 2020-04-11 DIAGNOSIS — S52615A Nondisplaced fracture of left ulna styloid process, initial encounter for closed fracture: Secondary | ICD-10-CM | POA: Diagnosis not present

## 2020-04-11 DIAGNOSIS — S52572A Other intraarticular fracture of lower end of left radius, initial encounter for closed fracture: Secondary | ICD-10-CM | POA: Diagnosis not present

## 2020-04-11 DIAGNOSIS — Z01812 Encounter for preprocedural laboratory examination: Secondary | ICD-10-CM | POA: Insufficient documentation

## 2020-04-11 DIAGNOSIS — S63035A Dislocation of midcarpal joint of left wrist, initial encounter: Secondary | ICD-10-CM | POA: Diagnosis not present

## 2020-04-11 LAB — SARS CORONAVIRUS 2 (TAT 6-24 HRS): SARS Coronavirus 2: NEGATIVE

## 2020-04-11 NOTE — H&P (Signed)
Victoria Herman is an 59 y.o. female.   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  She was seen in the emergency department for initial treatment.  She was put in a sugar tong splint and given medication. Further evaluation was done in our office.  Discussed the diagnosis of a perilunate fracture dislocation and the reason and rationale for surgery.  She has continued to have swelling, pain, weakness, and stiffness.  She denies numbness and tingling. A wrist brace was fit and dispensed and she was advised to wear this until the time of surgery.  She is here today for surgery. She denies fever, chills, chest pain, shortness of breath, nausea, vomiting, diarrhea.  Past Medical History:  Diagnosis Date  . Allergy   . Hyperlipidemia   . Seasonal allergies   . Superficial thrombophlebitis 03-2014, 07-2014    Past Surgical History:  Procedure Laterality Date  . WISDOM TOOTH EXTRACTION      Family History  Problem Relation Age of Onset  . Hypertension Mother   . Diabetes Mother   . Varicose Veins Mother   . Varicose Veins Father   . Cancer Father   . Varicose Veins Brother   . Hypertension Brother   . Hypertension Brother   . Varicose Veins Brother   . Colon cancer Neg Hx   . Esophageal cancer Neg Hx   . Rectal cancer Neg Hx   . Stomach cancer Neg Hx   . Breast cancer Neg Hx    Social History:  reports that she has never smoked. She has never used smokeless tobacco. She reports current alcohol use. She reports that she does not use drugs.  Allergies:  Allergies  Allergen Reactions  . E-Mycin [Erythromycin] Diarrhea  . Sulfa Antibiotics Rash    No medications prior to admission.    No results found for this or any previous visit (from the past 48 hour(s)). DG Wrist Complete Left  Result Date: 04/10/2020 CLINICAL DATA:  Status  post trauma. EXAM: LEFT WRIST - COMPLETE 3+ VIEW COMPARISON:  None. FINDINGS: An acute nondisplaced fracture is seen involving the left ulnar styloid. An additional fracture is seen along the dorsal aspect of the distal left radius. There is no evidence of dislocation. There is no evidence of arthropathy or other focal bone abnormality. Mild soft tissue swelling is seen adjacent to the previously noted fracture sites. IMPRESSION: Acute fractures of the distal left radius and ulnar styloid. Electronically Signed   By: Virgina Norfolk M.D.   On: 04/10/2020 20:03    ROS NO RECENT ILLNESSES OR HOSPITALIZATIONS  Last menstrual period 07/23/2013. Physical Exam  General Appearance:  Alert, cooperative, no distress, appears stated age  Head:  Normocephalic, without obvious abnormality, atraumatic  Eyes:  Pupils equal, conjunctiva/corneas clear,         Throat: Lips, mucosa, and tongue normal; teeth and gums normal  Neck: No visible masses     Lungs:   respirations unlabored  Chest Wall:  No tenderness or deformity  Heart:  Regular rate and rhythm,  Abdomen:   Soft, non-tender,         Extremities: LUE - MODERATE SWELLING WITHOUT ERYTHEMA OR OPEN WOUNDS OF THE LEFT WRIST. SENSATION INTACT TO LIGHT TOUCH. CAPILLARY REFILL LESS THAN 2 SECONDS. ABLE TO GENTLY WIGGLE ALL DIGITS WITH MILD DISCOMFORT. LIMITED MOTION OF THE WRIST  IN ALL DIRECTIONS. TENDERNESS TO PALPATION THROUGHOUT THE WRIST.  Pulses: 2+ and symmetric  Skin: Skin color, texture, turgor normal, no rashes or lesions     Neurologic: Normal    Assessment/Plan  LEFT WRIST TRANS-STYLOID PERILUNATE FRACTURE DISLOCATION  - LEFT WRIST OPEN REDUCTION AND INTERNAL FIXATION AND REPAIR/RECONSTRUCTION AS INDICATED    WE ARE PLANNING SURGERY FOR YOUR UPPER EXTREMITY. THE RISKS AND BENEFITS OF SURGERY INCLUDE BUT NOT LIMITED TO BLEEDING INFECTION, DAMAGE TO NEARBY NERVES ARTERIES TENDONS, FAILURE OF SURGERY TO ACCOMPLISH ITS INTENDED GOALS,  PERSISTENT SYMPTOMS AND NEED FOR FURTHER SURGICAL INTERVENTION. WITH THIS IN MIND WE WILL PROCEED. I HAVE DISCUSSED WITH THE PATIENT THE PRE AND POSTOPERATIVE REGIMEN AND THE DOS AND DON'TS. PT VOICED UNDERSTANDING AND INFORMED CONSENT SIGNED.  R/B/A DISCUSSED WITH PT IN OFFICE.  PT VOICED UNDERSTANDING OF PLAN CONSENT SIGNED DAY OF SURGERY PT SEEN AND EXAMINED PRIOR TO OPERATIVE PROCEDURE/DAY OF SURGERY SITE MARKED. QUESTIONS ANSWERED Belle MD     Brynda Peon 04/11/2020, 2:44 PM

## 2020-04-11 NOTE — Progress Notes (Signed)
Spoke with pt for pre-op call. Pt denies cardiac history, HTN or Diabetes. She states she does have a family hx of PE (her mother) and her brother has hx of blood clots in his legs and is on Coumadin. She states she has had superficial blood clots due to varicose veins.   Covid test done today, pt understands that she needs to quarantine until she comes to the hospital tomorrow.   ERAS protocol ordered. Instructed pt that she is not to eat food after midnight tonight. She may have clear liquids until 1:45 PM. 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 Pt unable to come by the hospital to pick up a Pre-Surgery Ensure.

## 2020-04-12 ENCOUNTER — Encounter (HOSPITAL_COMMUNITY): Payer: Self-pay | Admitting: Orthopedic Surgery

## 2020-04-12 ENCOUNTER — Encounter (HOSPITAL_COMMUNITY)
Admission: RE | Disposition: A | Discharge status: Home / Self Care | Payer: Self-pay | Source: Home / Self Care | Attending: Orthopedic Surgery

## 2020-04-12 ENCOUNTER — Ambulatory Visit (HOSPITAL_COMMUNITY): Payer: 59 | Admitting: Certified Registered Nurse Anesthetist

## 2020-04-12 ENCOUNTER — Ambulatory Visit (HOSPITAL_COMMUNITY)
Admission: RE | Admit: 2020-04-12 | Discharge: 2020-04-12 | Disposition: A | Discharge status: Home / Self Care | Payer: 59 | Attending: Orthopedic Surgery | Admitting: Orthopedic Surgery

## 2020-04-12 DIAGNOSIS — Z882 Allergy status to sulfonamides status: Secondary | ICD-10-CM | POA: Diagnosis not present

## 2020-04-12 DIAGNOSIS — S63095A Other dislocation of left wrist and hand, initial encounter: Secondary | ICD-10-CM

## 2020-04-12 DIAGNOSIS — E785 Hyperlipidemia, unspecified: Secondary | ICD-10-CM | POA: Diagnosis not present

## 2020-04-12 DIAGNOSIS — S52515A Nondisplaced fracture of left radial styloid process, initial encounter for closed fracture: Secondary | ICD-10-CM | POA: Insufficient documentation

## 2020-04-12 DIAGNOSIS — S52512A Displaced fracture of left radial styloid process, initial encounter for closed fracture: Secondary | ICD-10-CM | POA: Diagnosis not present

## 2020-04-12 DIAGNOSIS — S63035A Dislocation of midcarpal joint of left wrist, initial encounter: Secondary | ICD-10-CM | POA: Diagnosis not present

## 2020-04-12 DIAGNOSIS — S52572A Other intraarticular fracture of lower end of left radius, initial encounter for closed fracture: Secondary | ICD-10-CM | POA: Diagnosis not present

## 2020-04-12 DIAGNOSIS — S52592A Other fractures of lower end of left radius, initial encounter for closed fracture: Secondary | ICD-10-CM | POA: Diagnosis not present

## 2020-04-12 DIAGNOSIS — J302 Other seasonal allergic rhinitis: Secondary | ICD-10-CM | POA: Diagnosis not present

## 2020-04-12 DIAGNOSIS — Z881 Allergy status to other antibiotic agents status: Secondary | ICD-10-CM | POA: Diagnosis not present

## 2020-04-12 DIAGNOSIS — W1830XA Fall on same level, unspecified, initial encounter: Secondary | ICD-10-CM | POA: Insufficient documentation

## 2020-04-12 DIAGNOSIS — S63512A Sprain of carpal joint of left wrist, initial encounter: Secondary | ICD-10-CM | POA: Insufficient documentation

## 2020-04-12 DIAGNOSIS — S52502A Unspecified fracture of the lower end of left radius, initial encounter for closed fracture: Secondary | ICD-10-CM | POA: Diagnosis present

## 2020-04-12 DIAGNOSIS — F419 Anxiety disorder, unspecified: Secondary | ICD-10-CM | POA: Diagnosis not present

## 2020-04-12 HISTORY — DX: Gastro-esophageal reflux disease without esophagitis: K21.9

## 2020-04-12 HISTORY — DX: Anxiety disorder, unspecified: F41.9

## 2020-04-12 HISTORY — PX: ORIF WRIST FRACTURE: SHX2133

## 2020-04-12 SURGERY — OPEN REDUCTION INTERNAL FIXATION (ORIF) WRIST FRACTURE
Anesthesia: Monitor Anesthesia Care | Site: Wrist | Laterality: Left

## 2020-04-12 MED ORDER — ONDANSETRON HCL 4 MG/2ML IJ SOLN
INTRAMUSCULAR | Status: AC
  Filled 2020-04-12: qty 2

## 2020-04-12 MED ORDER — MIDAZOLAM HCL 2 MG/2ML IJ SOLN
INTRAMUSCULAR | Status: AC
  Administered 2020-04-12: 1 mg via INTRAVENOUS
  Filled 2020-04-12: qty 2

## 2020-04-12 MED ORDER — PROPOFOL 10 MG/ML IV BOLUS
INTRAVENOUS | Status: AC
  Filled 2020-04-12: qty 20

## 2020-04-12 MED ORDER — BUPIVACAINE HCL (PF) 0.25 % IJ SOLN
INTRAMUSCULAR | Status: DC | PRN
  Administered 2020-04-12: 10 mL

## 2020-04-12 MED ORDER — EPHEDRINE 5 MG/ML INJ
INTRAVENOUS | Status: AC
  Filled 2020-04-12: qty 10

## 2020-04-12 MED ORDER — MIDAZOLAM HCL 5 MG/5ML IJ SOLN
INTRAMUSCULAR | Status: DC | PRN
  Administered 2020-04-12: 1 mg via INTRAVENOUS

## 2020-04-12 MED ORDER — LIDOCAINE 2% (20 MG/ML) 5 ML SYRINGE
INTRAMUSCULAR | Status: AC
  Filled 2020-04-12: qty 5

## 2020-04-12 MED ORDER — FENTANYL CITRATE (PF) 100 MCG/2ML IJ SOLN: 25.0000 ug | INTRAMUSCULAR | Status: DC | PRN

## 2020-04-12 MED ORDER — MIDAZOLAM HCL 2 MG/2ML IJ SOLN: 1.0000 mg | Freq: Once | INTRAMUSCULAR | Status: AC

## 2020-04-12 MED ORDER — BUPIVACAINE-EPINEPHRINE (PF) 0.5% -1:200000 IJ SOLN
INTRAMUSCULAR | Status: DC | PRN
Start: 2020-04-12 — End: 2020-04-12
  Administered 2020-04-12: 30 mL via PERINEURAL

## 2020-04-12 MED ORDER — MIDAZOLAM HCL 2 MG/2ML IJ SOLN
INTRAMUSCULAR | Status: AC
  Filled 2020-04-12: qty 2

## 2020-04-12 MED ORDER — OXYCODONE HCL 5 MG PO TABS: 5.0000 mg | ORAL_TABLET | Freq: Once | ORAL | Status: DC | PRN

## 2020-04-12 MED ORDER — FENTANYL CITRATE (PF) 250 MCG/5ML IJ SOLN
INTRAMUSCULAR | Status: AC
  Filled 2020-04-12: qty 5

## 2020-04-12 MED ORDER — FENTANYL CITRATE (PF) 100 MCG/2ML IJ SOLN: 50.0000 ug | Freq: Once | INTRAMUSCULAR | Status: AC

## 2020-04-12 MED ORDER — OXYCODONE HCL 5 MG/5ML PO SOLN: 5.0000 mg | Freq: Once | ORAL | Status: DC | PRN

## 2020-04-12 MED ORDER — FENTANYL CITRATE (PF) 100 MCG/2ML IJ SOLN
INTRAMUSCULAR | Status: AC
  Administered 2020-04-12: 50 ug via INTRAVENOUS
  Filled 2020-04-12: qty 2

## 2020-04-12 MED ORDER — CEFAZOLIN SODIUM-DEXTROSE 2-4 GM/100ML-% IV SOLN
2.0000 g | INTRAVENOUS | Status: AC
  Administered 2020-04-12: 2 g via INTRAVENOUS

## 2020-04-12 MED ORDER — PROPOFOL 10 MG/ML IV BOLUS
INTRAVENOUS | Status: DC | PRN
  Administered 2020-04-12: 10 mg via INTRAVENOUS
  Administered 2020-04-12: 20 mg via INTRAVENOUS

## 2020-04-12 MED ORDER — PROPOFOL 500 MG/50ML IV EMUL
INTRAVENOUS | Status: DC | PRN
  Administered 2020-04-12: 125 ug/kg/min via INTRAVENOUS

## 2020-04-12 MED ORDER — ONDANSETRON HCL 4 MG/2ML IJ SOLN: 4.0000 mg | Freq: Four times a day (QID) | INTRAMUSCULAR | Status: DC | PRN

## 2020-04-12 MED ORDER — PHENYLEPHRINE 40 MCG/ML (10ML) SYRINGE FOR IV PUSH (FOR BLOOD PRESSURE SUPPORT)
PREFILLED_SYRINGE | INTRAVENOUS | Status: AC
  Filled 2020-04-12: qty 10

## 2020-04-12 MED ORDER — ORAL CARE MOUTH RINSE: 15.0000 mL | Freq: Once | OROMUCOSAL | Status: AC

## 2020-04-12 MED ORDER — ONDANSETRON HCL 4 MG/2ML IJ SOLN
INTRAMUSCULAR | Status: DC | PRN
  Administered 2020-04-12: 4 mg via INTRAVENOUS

## 2020-04-12 MED ORDER — CHLORHEXIDINE GLUCONATE 0.12 % MT SOLN
OROMUCOSAL | Status: AC
  Administered 2020-04-12: 15 mL via OROMUCOSAL
  Filled 2020-04-12: qty 15

## 2020-04-12 MED ORDER — LACTATED RINGERS IV SOLN: INTRAVENOUS | Status: DC

## 2020-04-12 MED ORDER — FENTANYL CITRATE (PF) 100 MCG/2ML IJ SOLN
INTRAMUSCULAR | Status: DC | PRN
  Administered 2020-04-12: 25 ug via INTRAVENOUS

## 2020-04-12 MED ORDER — BUPIVACAINE HCL (PF) 0.25 % IJ SOLN
INTRAMUSCULAR | Status: AC
  Filled 2020-04-12: qty 30

## 2020-04-12 MED ORDER — 0.9 % SODIUM CHLORIDE (POUR BTL) OPTIME
TOPICAL | Status: DC | PRN
  Administered 2020-04-12: 1000 mL

## 2020-04-12 MED ORDER — CHLORHEXIDINE GLUCONATE 0.12 % MT SOLN: 15.0000 mL | Freq: Once | OROMUCOSAL | Status: AC

## 2020-04-12 SURGICAL SUPPLY — 58 items
ANCHOR FT CORKSCREW MICRO 2-0 (Anchor) ×9 IMPLANT
BIT DRILL 1.7 (BIT) ×3
BIT DRILL 1.7X (BIT) ×1 IMPLANT
BLADE CLIPPER SURG (BLADE) IMPLANT
BNDG ELASTIC 3X5.8 VLCR STR LF (GAUZE/BANDAGES/DRESSINGS) ×3 IMPLANT
BNDG ELASTIC 4X5.8 VLCR STR LF (GAUZE/BANDAGES/DRESSINGS) ×3 IMPLANT
BNDG ESMARK 4X9 LF (GAUZE/BANDAGES/DRESSINGS) ×3 IMPLANT
BNDG GAUZE ELAST 4 BULKY (GAUZE/BANDAGES/DRESSINGS) ×3 IMPLANT
CORD BIPOLAR FORCEPS 12FT (ELECTRODE) ×3 IMPLANT
COVER SURGICAL LIGHT HANDLE (MISCELLANEOUS) ×3 IMPLANT
COVER WAND RF STERILE (DRAPES) ×3 IMPLANT
CUFF TOURN SGL QUICK 18X4 (TOURNIQUET CUFF) ×3 IMPLANT
CUFF TOURN SGL QUICK 24 (TOURNIQUET CUFF)
CUFF TRNQT CYL 24X4X16.5-23 (TOURNIQUET CUFF) IMPLANT
DRAIN TLS ROUND 10FR (DRAIN) IMPLANT
DRAPE OEC MINIVIEW 54X84 (DRAPES) ×3 IMPLANT
DRAPE SURG 17X11 SM STRL (DRAPES) ×3 IMPLANT
DRSG ADAPTIC 3X8 NADH LF (GAUZE/BANDAGES/DRESSINGS) ×3 IMPLANT
ELECT REM PT RETURN 9FT ADLT (ELECTROSURGICAL)
ELECTRODE REM PT RTRN 9FT ADLT (ELECTROSURGICAL) IMPLANT
GAUZE SPONGE 4X4 12PLY STRL (GAUZE/BANDAGES/DRESSINGS) ×3 IMPLANT
GAUZE SPONGE 4X4 12PLY STRL LF (GAUZE/BANDAGES/DRESSINGS) ×3 IMPLANT
GLOVE BIOGEL PI IND STRL 8.5 (GLOVE) ×1 IMPLANT
GLOVE BIOGEL PI INDICATOR 8.5 (GLOVE) ×2
GLOVE SURG ORTHO 8.0 STRL STRW (GLOVE) ×3 IMPLANT
GOWN STRL REUS W/ TWL LRG LVL3 (GOWN DISPOSABLE) ×3 IMPLANT
GOWN STRL REUS W/ TWL XL LVL3 (GOWN DISPOSABLE) ×1 IMPLANT
GOWN STRL REUS W/TWL LRG LVL3 (GOWN DISPOSABLE) ×9
GOWN STRL REUS W/TWL XL LVL3 (GOWN DISPOSABLE) ×3
K-WIRE .045X4 (WIRE) ×12 IMPLANT
KIT BASIN OR (CUSTOM PROCEDURE TRAY) ×3 IMPLANT
KIT TURNOVER KIT B (KITS) ×3 IMPLANT
MANIFOLD NEPTUNE II (INSTRUMENTS) ×3 IMPLANT
NEEDLE HYPO 25X1 1.5 SAFETY (NEEDLE) ×3 IMPLANT
NS IRRIG 1000ML POUR BTL (IV SOLUTION) ×3 IMPLANT
PACK ORTHO EXTREMITY (CUSTOM PROCEDURE TRAY) ×3 IMPLANT
PAD ARMBOARD 7.5X6 YLW CONV (MISCELLANEOUS) ×6 IMPLANT
PAD CAST 4YDX4 CTTN HI CHSV (CAST SUPPLIES) ×1 IMPLANT
PADDING CAST ABS 3INX4YD NS (CAST SUPPLIES) ×2
PADDING CAST ABS COTTON 3X4 (CAST SUPPLIES) ×1 IMPLANT
PADDING CAST COTTON 4X4 STRL (CAST SUPPLIES) ×3
SCREW CORT LP 2.4X22 (Screw) ×3 IMPLANT
SOAP 2 % CHG 4 OZ (WOUND CARE) ×3 IMPLANT
SUT ETHIBOND 3 0 SH 1 (SUTURE) ×3 IMPLANT
SUT MNCRL AB 4-0 PS2 18 (SUTURE) ×3 IMPLANT
SUT PROLENE 4 0 PS 2 18 (SUTURE) IMPLANT
SUT PROLENE 4 0 SH DA (SUTURE) ×3 IMPLANT
SUT VIC AB 2-0 FS1 27 (SUTURE) IMPLANT
SUT VIC AB 2-0 SH 27 (SUTURE) ×3
SUT VIC AB 2-0 SH 27X BRD (SUTURE) ×1 IMPLANT
SUT VICRYL 4-0 PS2 18IN ABS (SUTURE) IMPLANT
SYR CONTROL 10ML LL (SYRINGE) IMPLANT
SYSTEM CHEST DRAIN TLS 7FR (DRAIN) IMPLANT
TOWEL GREEN STERILE (TOWEL DISPOSABLE) ×3 IMPLANT
TOWEL GREEN STERILE FF (TOWEL DISPOSABLE) ×3 IMPLANT
TUBE CONNECTING 12'X1/4 (SUCTIONS) ×1
TUBE CONNECTING 12X1/4 (SUCTIONS) ×2 IMPLANT
WATER STERILE IRR 1000ML POUR (IV SOLUTION) ×3 IMPLANT

## 2020-04-12 NOTE — Op Note (Signed)
PREOPERATIVE DIAGNOSIS:Left wrist transtyloid perilunate fracture dislocation  POSTOPERATIVE DIAGNOSIS:Same  ATTENDING SURGEON:Dr. Iran Planas who was scrubbed and present for the entire procedure  ASSISTANT SURGEON:Samantha Tenny Craw who was scrubbed and necessary for exposure, reduction internal fixation and repair, reconstruction and closure  ANESTHESIA:Regional   OPERATIVE PROCEDURE: #1: Open treatment of transstyloid type perilunate dislocation left #2: Left wrist open reconstruction, scapholunate ligament tear, capsulorrhaphy #3: Posterior interosseous nerve neurectomy left #4: Extensor pollicis longus tendon transfer left #5: Open treatment of left wrist intra-articular distal radius fracture of 2 or more fragments #6: Radiographs 3 views left wrist  IMPLANTS: 3 micro corkscrew right anchors, Arthrex One 2.5 mm Arthrex screw from the modular hand set Four 0.045 K wires  RADIOGRAPHIC INTERPRETATION: AP lateral oblique views of the wrist do show the K wire fixation across the SL and LT interval as well as across the midcarpal joint with the suture anchor fixation and screw fixation in good position  SURGICAL INDICATIONS: Patient is a right-hand-dominant female who sustained the closed transstyloid perilunate fracture dislocation.  Patient was seen and evaluated in the office and recommended undergo the above procedure.  The risks of surgery include but not limited to bleeding infection damage nearby nerves arteries or tendons loss of motion of the wrist and digits incomplete relief of symptoms and need for further surgical invention.  SURGICAL TECHNIQUE: Patient was palpated via the preop holding area marked for marker made on the left wrist indicate correct operative site.  Patient brought back operating placed supine on anesthesia table where the regional anesthetic was administered.  Patient tolerates well.  Well-padded tourniquet placed on the left brachium seal with appropriate  drape.  Left upper extremities then prepped and draped normal sterile fashion.  A timeout was called the correct site was then fine procedure then begun.  The patient was then limb was elevated and Esmarch exsanguination was then used the tourniquet insufflated  250 mmHg.  Following this the wrist was then placed in approximately 5 pounds of countertraction with the finger traps.  Once this was then carried out and while she incision made directly in line with the long finger metacarpal across the wrist joint.  Dissection carried down through the skin and subcutaneous tissue.  Deep dissection carried down to identify the third dorsal compartment and the extensor pollicis longus tendon was then identified and carefully transferred dorsally.  Going through the floor the fourth dorsal compartment the retinaculum was opened up.  The posterior interosseous nerve was then identified and posterior interosseous nerve neurectomy was then carried out resecting the distal portion of the PIN nerve.  Following this a longitudinal capsulotomy expose the joint.  The patient did have the transdermal repair elected mini dislocation.  Open reduction was then performed.  The lunate was then reduced.  Suture anchors were placed in both the scaphoid and the lunate as the SL ligament had avulsed off the scaphoid.  Anchors were placed in both bones.  K wires were then preloaded within the scaphoid to allow for adequate placement of the anchors.  The sutures were then delivered through the SL ligament along the dorsal margin of the SL ligament.  After the sutures were then passed they were not tied down.  The K wires were then placed two 0.045 K wires were then placed placed across the SL interval.  These K wires were then confirmed using the mini C arm.  Reduction clamp was held while placement of the K wires across reducing the SL  interval.  Following this the LT interval was then reduced.  The K wires were able to be driven across the  LT interval.  An additional K wire was then placed in the triquetrum across the midcarpal joint finally an additional K wire was then placed from the scaphoid into the capitate to complete the midcarpal fixation.  The wound was thoroughly irrigated.  Small cartilaginous fragments were removed within the joint.  Intra-articular distal radius fracture had several fragments.  The large fragment was then reduced.  Using the 1.7 mm drill bit the appropriate drilling and depth is measured was then carried out to place the 22 mm in length 2.5 millimeter screw across the large dorsal intra-articular fragment.  Once this was carried out thick capsular closure was then carried out.  The suture anchors were then tied down to the SL interval.  These were then brought through the capsule and tying the suture knots above the capsule to complete the capsulorrhaphy.  The remaining portion the capsule was then closed with 3-0 Ethibond suture.  The retinaculum was closed with 2-0 Vicryl.  Subcutaneous tissues closed with 4-0 Monocryl.  The skin was closed with simple 3-0 Prolene.  Adaptic dressing a sterile compressive bandage applied.  The patient was placed in a well-padded sugar tong splint.  The K wires then cut left beneath the skin.  Patient tolerated the procedure well returned recovery room in good condition.  POSTOPERATIVE PLAN: Patient be discharged to home.  See him back in the office in 2 weeks for wound check suture removal x-rays application of a short arm cast for total of 4 weeks.  We will see her back at the 6-week mark.  We will place her back into a cast for additional 2 weeks with the plan on taking the K wires out at the 8-week mark.  Radiographs at each visit. The patient did have the significant injury.  These are very devastating injuries to the wrist.  We outlined in the operative planning the difficult nature of these injuries especially try to get back mobility and maintenance of the SL interval and  potential arthritis down the line.  The goal is to provide some stability as she gets back to use and function of the wrist.

## 2020-04-12 NOTE — Anesthesia Procedure Notes (Signed)
Procedure Name: MAC Date/Time: 04/12/2020 3:49 PM Performed by: Inda Coke, CRNA Pre-anesthesia Checklist: Patient identified, Emergency Drugs available, Suction available, Timeout performed and Patient being monitored Patient Re-evaluated:Patient Re-evaluated prior to induction Oxygen Delivery Method: Simple face mask Induction Type: IV induction Dental Injury: Teeth and Oropharynx as per pre-operative assessment

## 2020-04-12 NOTE — Transfer of Care (Signed)
Immediate Anesthesia Transfer of Care Note  Patient: Victoria Herman  Procedure(s) Performed: OPEN REDUCTION INTERNAL FIXATION (ORIF) WRIST FRACTURE and repair reconstruction as indicated (Left Wrist)  Patient Location: PACU  Anesthesia Type:MAC combined with regional for post-op pain  Level of Consciousness: awake, alert  and oriented  Airway & Oxygen Therapy: Patient Spontanous Breathing and Patient connected to face mask oxygen  Post-op Assessment: Report given to RN and Post -op Vital signs reviewed and stable  Post vital signs: Reviewed and stable  Last Vitals:  Vitals Value Taken Time  BP 148/63 04/12/20 1744  Temp    Pulse 65 04/12/20 1745  Resp 14 04/12/20 1745  SpO2 100 % 04/12/20 1745  Vitals shown include unvalidated device data.  Last Pain:  Vitals:   04/12/20 1426  TempSrc:   PainSc: 0-No pain         Complications: No complications documented.

## 2020-04-12 NOTE — Anesthesia Preprocedure Evaluation (Signed)
Anesthesia Evaluation  Patient identified by MRN, date of birth, ID band Patient awake    Reviewed: Allergy & Precautions, H&P , NPO status , Patient's Chart, lab work & pertinent test results  Airway Mallampati: II   Neck ROM: full    Dental   Pulmonary neg pulmonary ROS,    breath sounds clear to auscultation       Cardiovascular negative cardio ROS   Rhythm:regular Rate:Normal     Neuro/Psych PSYCHIATRIC DISORDERS Anxiety    GI/Hepatic GERD  ,  Endo/Other    Renal/GU      Musculoskeletal   Abdominal   Peds  Hematology   Anesthesia Other Findings   Reproductive/Obstetrics                            Anesthesia Physical Anesthesia Plan  ASA: II  Anesthesia Plan: MAC and Regional   Post-op Pain Management:    Induction: Intravenous  PONV Risk Score and Plan: 2 and Propofol infusion and Treatment may vary due to age or medical condition  Airway Management Planned: Simple Face Mask  Additional Equipment:   Intra-op Plan:   Post-operative Plan:   Informed Consent: I have reviewed the patients History and Physical, chart, labs and discussed the procedure including the risks, benefits and alternatives for the proposed anesthesia with the patient or authorized representative who has indicated his/her understanding and acceptance.       Plan Discussed with: CRNA, Anesthesiologist and Surgeon  Anesthesia Plan Comments:         Anesthesia Quick Evaluation

## 2020-04-12 NOTE — Anesthesia Procedure Notes (Signed)
Anesthesia Regional Block: Supraclavicular block   Pre-Anesthetic Checklist: ,, timeout performed, Correct Patient, Correct Site, Correct Laterality, Correct Procedure, Correct Position, site marked, Risks and benefits discussed,  Surgical consent,  Pre-op evaluation,  At surgeon's request and post-op pain management  Laterality: Left  Prep: chloraprep       Needles:  Injection technique: Single-shot  Needle Type: Echogenic Stimulator Needle     Needle Length: 5cm  Needle Gauge: 22     Additional Needles:   Procedures:, nerve stimulator,,,,,,,   Nerve Stimulator or Paresthesia:  Response: biceps flexion, 0.45 mA,   Additional Responses:   Narrative:  Start time: 04/12/2020 3:30 PM End time: 04/12/2020 3:36 PM Injection made incrementally with aspirations every 5 mL.  Performed by: Personally  Anesthesiologist: Albertha Ghee, MD  Additional Notes: Functioning IV was confirmed and monitors were applied.  A 65mm 22ga Arrow echogenic stimulator needle was used. Sterile prep and drape,hand hygiene and sterile gloves were used.  Negative aspiration and negative test dose prior to incremental administration of local anesthetic. The patient tolerated the procedure well.  Ultrasound guidance: relevent anatomy identified, needle position confirmed, local anesthetic spread visualized around nerve(s), vascular puncture avoided.  Image printed for medical record.

## 2020-04-12 NOTE — Discharge Instructions (Signed)
[facility]EEP [facility] CALL OFFICE FOR F/U APPT [phone] in 13 days rx sent to [facility] KEEP HAND ELEVATED ABOVE HEART OK TO APPLY ICE TO OPERATIVE AREA CONTACT OFFICE IF ANY WORSENING PAIN OR CONCERNS.

## 2020-04-12 NOTE — Anesthesia Postprocedure Evaluation (Addendum)
Anesthesia Post Note  Patient: Victoria Herman  Procedure(s) Performed: OPEN REDUCTION INTERNAL FIXATION (ORIF) WRIST FRACTURE and repair reconstruction as indicated (Left Wrist)     Patient location during evaluation: PACU Anesthesia Type: Regional and MAC Level of consciousness: awake and alert and oriented Pain management: pain level controlled Vital Signs Assessment: post-procedure vital signs reviewed and stable Respiratory status: spontaneous breathing, nonlabored ventilation and respiratory function stable Cardiovascular status: blood pressure returned to baseline and stable Postop Assessment: no apparent nausea or vomiting Anesthetic complications: no   No complications documented.  Last Vitals:  Vitals:   04/12/20 1830 04/12/20 1845  BP: (!) 157/81 (!) 150/80  Pulse: 70 60  Resp: (!) 22 15  Temp:    SpO2: 100% 100%    Last Pain:  Vitals:   04/12/20 1815  TempSrc:   PainSc: 0-No pain                 Charleene Callegari A.

## 2020-04-13 ENCOUNTER — Encounter (HOSPITAL_COMMUNITY): Payer: Self-pay | Admitting: Orthopedic Surgery

## 2020-04-26 ENCOUNTER — Ambulatory Visit: Payer: 59

## 2020-04-27 DIAGNOSIS — Z4789 Encounter for other orthopedic aftercare: Secondary | ICD-10-CM | POA: Diagnosis not present

## 2020-04-27 DIAGNOSIS — S52572A Other intraarticular fracture of lower end of left radius, initial encounter for closed fracture: Secondary | ICD-10-CM | POA: Diagnosis not present

## 2020-05-11 DIAGNOSIS — Z4789 Encounter for other orthopedic aftercare: Secondary | ICD-10-CM | POA: Diagnosis not present

## 2020-05-11 DIAGNOSIS — M25532 Pain in left wrist: Secondary | ICD-10-CM | POA: Diagnosis not present

## 2020-05-11 DIAGNOSIS — S52572A Other intraarticular fracture of lower end of left radius, initial encounter for closed fracture: Secondary | ICD-10-CM | POA: Diagnosis not present

## 2020-05-25 DIAGNOSIS — Z4789 Encounter for other orthopedic aftercare: Secondary | ICD-10-CM | POA: Diagnosis not present

## 2020-05-25 DIAGNOSIS — S52572D Other intraarticular fracture of lower end of left radius, subsequent encounter for closed fracture with routine healing: Secondary | ICD-10-CM | POA: Diagnosis not present

## 2020-05-30 ENCOUNTER — Other Ambulatory Visit: Payer: Self-pay | Admitting: Internal Medicine

## 2020-05-30 DIAGNOSIS — Z1231 Encounter for screening mammogram for malignant neoplasm of breast: Secondary | ICD-10-CM

## 2020-06-06 ENCOUNTER — Other Ambulatory Visit: Payer: Self-pay

## 2020-06-06 ENCOUNTER — Ambulatory Visit
Admission: RE | Admit: 2020-06-06 | Discharge: 2020-06-06 | Disposition: A | Discharge status: Home / Self Care | Payer: 59 | Source: Ambulatory Visit

## 2020-06-06 DIAGNOSIS — Z1231 Encounter for screening mammogram for malignant neoplasm of breast: Secondary | ICD-10-CM

## 2020-06-08 DIAGNOSIS — S52572A Other intraarticular fracture of lower end of left radius, initial encounter for closed fracture: Secondary | ICD-10-CM | POA: Diagnosis not present

## 2020-06-08 MED FILL — HYDROCODON-APAP 5-325: 5-325 | 5 days supply | Qty: 20 | Fill #0

## 2020-06-20 DIAGNOSIS — M25642 Stiffness of left hand, not elsewhere classified: Secondary | ICD-10-CM | POA: Diagnosis not present

## 2020-06-20 DIAGNOSIS — S52572A Other intraarticular fracture of lower end of left radius, initial encounter for closed fracture: Secondary | ICD-10-CM | POA: Diagnosis not present

## 2020-06-22 DIAGNOSIS — M25642 Stiffness of left hand, not elsewhere classified: Secondary | ICD-10-CM | POA: Diagnosis not present

## 2020-06-26 MED FILL — CITALOPRAM HBR 10 MG TABLET: 10 | 90 days supply | Qty: 90 | Fill #0

## 2020-06-27 DIAGNOSIS — M25642 Stiffness of left hand, not elsewhere classified: Secondary | ICD-10-CM | POA: Diagnosis not present

## 2020-06-29 DIAGNOSIS — M25642 Stiffness of left hand, not elsewhere classified: Secondary | ICD-10-CM | POA: Diagnosis not present

## 2020-06-29 DIAGNOSIS — M25532 Pain in left wrist: Secondary | ICD-10-CM | POA: Diagnosis not present

## 2020-07-04 DIAGNOSIS — M25642 Stiffness of left hand, not elsewhere classified: Secondary | ICD-10-CM | POA: Diagnosis not present

## 2020-07-06 DIAGNOSIS — M25642 Stiffness of left hand, not elsewhere classified: Secondary | ICD-10-CM | POA: Diagnosis not present

## 2020-07-11 DIAGNOSIS — Z4789 Encounter for other orthopedic aftercare: Secondary | ICD-10-CM | POA: Diagnosis not present

## 2020-07-11 DIAGNOSIS — M25642 Stiffness of left hand, not elsewhere classified: Secondary | ICD-10-CM | POA: Diagnosis not present

## 2020-07-11 DIAGNOSIS — S52572A Other intraarticular fracture of lower end of left radius, initial encounter for closed fracture: Secondary | ICD-10-CM | POA: Diagnosis not present

## 2020-07-13 DIAGNOSIS — M25642 Stiffness of left hand, not elsewhere classified: Secondary | ICD-10-CM | POA: Diagnosis not present

## 2020-07-18 DIAGNOSIS — M25642 Stiffness of left hand, not elsewhere classified: Secondary | ICD-10-CM | POA: Diagnosis not present

## 2020-07-20 DIAGNOSIS — M25642 Stiffness of left hand, not elsewhere classified: Secondary | ICD-10-CM | POA: Diagnosis not present

## 2020-07-25 DIAGNOSIS — M25642 Stiffness of left hand, not elsewhere classified: Secondary | ICD-10-CM | POA: Diagnosis not present

## 2020-07-27 DIAGNOSIS — M25642 Stiffness of left hand, not elsewhere classified: Secondary | ICD-10-CM | POA: Diagnosis not present

## 2020-07-31 DIAGNOSIS — M25642 Stiffness of left hand, not elsewhere classified: Secondary | ICD-10-CM | POA: Diagnosis not present

## 2020-08-01 DIAGNOSIS — M25642 Stiffness of left hand, not elsewhere classified: Secondary | ICD-10-CM | POA: Diagnosis not present

## 2020-08-03 DIAGNOSIS — M25642 Stiffness of left hand, not elsewhere classified: Secondary | ICD-10-CM | POA: Diagnosis not present

## 2020-08-08 DIAGNOSIS — M25642 Stiffness of left hand, not elsewhere classified: Secondary | ICD-10-CM | POA: Diagnosis not present

## 2020-08-10 DIAGNOSIS — M25642 Stiffness of left hand, not elsewhere classified: Secondary | ICD-10-CM | POA: Diagnosis not present

## 2020-08-10 DIAGNOSIS — S52572A Other intraarticular fracture of lower end of left radius, initial encounter for closed fracture: Secondary | ICD-10-CM | POA: Diagnosis not present

## 2020-08-15 DIAGNOSIS — M25642 Stiffness of left hand, not elsewhere classified: Secondary | ICD-10-CM | POA: Diagnosis not present

## 2020-08-17 DIAGNOSIS — M25642 Stiffness of left hand, not elsewhere classified: Secondary | ICD-10-CM | POA: Diagnosis not present

## 2020-08-21 ENCOUNTER — Encounter: Payer: Self-pay | Admitting: Internal Medicine

## 2020-08-22 DIAGNOSIS — M25642 Stiffness of left hand, not elsewhere classified: Secondary | ICD-10-CM | POA: Diagnosis not present

## 2020-08-25 DIAGNOSIS — M25642 Stiffness of left hand, not elsewhere classified: Secondary | ICD-10-CM | POA: Diagnosis not present

## 2020-08-29 DIAGNOSIS — M25642 Stiffness of left hand, not elsewhere classified: Secondary | ICD-10-CM | POA: Diagnosis not present

## 2020-09-05 ENCOUNTER — Encounter: Payer: Self-pay | Admitting: Internal Medicine

## 2020-09-05 DIAGNOSIS — M25642 Stiffness of left hand, not elsewhere classified: Secondary | ICD-10-CM | POA: Diagnosis not present

## 2020-09-07 DIAGNOSIS — M25642 Stiffness of left hand, not elsewhere classified: Secondary | ICD-10-CM | POA: Diagnosis not present

## 2020-09-12 DIAGNOSIS — M25642 Stiffness of left hand, not elsewhere classified: Secondary | ICD-10-CM | POA: Diagnosis not present

## 2020-09-12 DIAGNOSIS — S52572A Other intraarticular fracture of lower end of left radius, initial encounter for closed fracture: Secondary | ICD-10-CM | POA: Diagnosis not present

## 2020-09-14 DIAGNOSIS — M25642 Stiffness of left hand, not elsewhere classified: Secondary | ICD-10-CM | POA: Diagnosis not present

## 2020-09-21 DIAGNOSIS — M25642 Stiffness of left hand, not elsewhere classified: Secondary | ICD-10-CM | POA: Diagnosis not present

## 2020-09-25 MED FILL — CITALOPRAM HBR 10 MG TABLET: 10 | 90 days supply | Qty: 90 | Fill #1

## 2020-09-26 ENCOUNTER — Ambulatory Visit: Payer: 59 | Admitting: Internal Medicine

## 2020-09-28 DIAGNOSIS — M25642 Stiffness of left hand, not elsewhere classified: Secondary | ICD-10-CM | POA: Diagnosis not present

## 2020-10-05 DIAGNOSIS — M25642 Stiffness of left hand, not elsewhere classified: Secondary | ICD-10-CM | POA: Diagnosis not present

## 2020-10-17 DIAGNOSIS — M25642 Stiffness of left hand, not elsewhere classified: Secondary | ICD-10-CM | POA: Diagnosis not present

## 2020-11-07 ENCOUNTER — Ambulatory Visit: Payer: 59 | Admitting: Internal Medicine

## 2020-11-16 DIAGNOSIS — S52572D Other intraarticular fracture of lower end of left radius, subsequent encounter for closed fracture with routine healing: Secondary | ICD-10-CM | POA: Diagnosis not present

## 2020-11-21 ENCOUNTER — Encounter: Payer: Self-pay | Admitting: Internal Medicine

## 2020-11-21 ENCOUNTER — Other Ambulatory Visit: Payer: Self-pay | Admitting: Internal Medicine

## 2020-11-21 ENCOUNTER — Other Ambulatory Visit: Payer: Self-pay

## 2020-11-21 ENCOUNTER — Ambulatory Visit (INDEPENDENT_AMBULATORY_CARE_PROVIDER_SITE_OTHER): Payer: 59 | Admitting: Internal Medicine

## 2020-11-21 VITALS — BP 118/76 | HR 94 | Temp 98.4°F | Ht 65.0 in | Wt 211.2 lb

## 2020-11-21 DIAGNOSIS — Z6835 Body mass index (BMI) 35.0-35.9, adult: Secondary | ICD-10-CM | POA: Diagnosis not present

## 2020-11-21 DIAGNOSIS — E6609 Other obesity due to excess calories: Secondary | ICD-10-CM | POA: Diagnosis not present

## 2020-11-21 DIAGNOSIS — E7800 Pure hypercholesterolemia, unspecified: Secondary | ICD-10-CM | POA: Diagnosis not present

## 2020-11-21 DIAGNOSIS — F419 Anxiety disorder, unspecified: Secondary | ICD-10-CM

## 2020-11-21 MED ORDER — CITALOPRAM HYDROBROMIDE 10 MG PO TABS: 10.0000 mg | ORAL_TABLET | Freq: Every day | ORAL | 2 refills | Status: DC

## 2020-11-21 NOTE — Progress Notes (Signed)
I,Katawbba Wiggins,acting as a Education administrator for Maximino Greenland, MD.,have documented all relevant documentation on the behalf of Maximino Greenland, MD,as directed by  Maximino Greenland, MD while in the presence of Maximino Greenland, MD.  This visit occurred during the SARS-CoV-2 public health emergency.  Safety protocols were in place, including screening questions prior to the visit, additional usage of staff PPE, and extensive cleaning of exam room while observing appropriate contact time as indicated for disinfecting solutions.  Subjective:     Patient ID: Victoria Herman , female    DOB: 09/28/1961 , 60 y.o.   MRN: [id number]   Chief Complaint  Patient presents with  . Hyperlipidemia  . Anxiety    HPI  The patient is here today for a follow-up on her cholesterol and anxiety. She has been taking Lipid Factors/berberine supplements. She is not interested in taking statins because of their reputation with side effects. She is concerned about the myalgias and arthralgias that are associated with statins.   She admits she is under a great deal of stress at work. She has had to work more hours due to short staffing.   Anxiety Presents for follow-up visit. Patient reports no chest pain, decreased concentration, depressed mood, feeling of choking, irritability, nervous/anxious behavior, palpitations or shortness of breath. The severity of symptoms is mild. The quality of sleep is fair. Nighttime awakenings: none.   Compliance with medications is 76-100%.     Past Medical History:  Diagnosis Date  . Allergy   . Anxiety   . GERD (gastroesophageal reflux disease)   . Hyperlipidemia   . Seasonal allergies   . Superficial thrombophlebitis 03-2014, 07-2014     Family History  Problem Relation Age of Onset  . Hypertension Mother   . Diabetes Mother   . Varicose Veins Mother   . Varicose Veins Father   . Cancer Father   . Varicose Veins Brother   . Hypertension Brother   . Hypertension Brother    . Varicose Veins Brother   . Colon cancer Neg Hx   . Esophageal cancer Neg Hx   . Rectal cancer Neg Hx   . Stomach cancer Neg Hx   . Breast cancer Neg Hx      Current Outpatient Medications:  .  Barberry-Oreg Grape-Goldenseal (BERBERINE COMPLEX PO), Take 2 tablets by mouth daily., Disp: , Rfl:  .  Calcium Carb-Cholecalciferol (CALCIUM 600 + D PO), Take 1 tablet by mouth daily. Vit D 12.5, Disp: , Rfl:  .  cetirizine (ZYRTEC) 10 MG tablet, Take 10 mg by mouth daily., Disp: , Rfl:  .  Cholecalciferol (VITAMIN D3 SUPER STRENGTH) 50 MCG (2000 UT) CAPS, Take 2,000 Units by mouth daily., Disp: , Rfl:  .  EPINEPHrine 0.3 mg/0.3 mL IJ SOAJ injection, Inject 0.3 mg into the skin once as needed., Disp: , Rfl:  .  mometasone (NASONEX) 50 MCG/ACT nasal spray, Place 2 sprays into the nose daily as needed (allergies). , Disp: , Rfl:  .  NON FORMULARY, 2 capsules. Lipid factor from [facility], Disp: , Rfl:  .  citalopram (CELEXA) 10 MG tablet, Take 1 tablet (10 mg total) by mouth daily., Disp: 90 tablet, Rfl: 2   Allergies  Allergen Reactions  . E-Mycin [Erythromycin] Diarrhea  . Sulfa Antibiotics Rash     Review of Systems  Constitutional: Negative.  Negative for irritability.  Respiratory: Negative.  Negative for shortness of breath.   Cardiovascular: Negative.  Negative for chest pain and palpitations.  Gastrointestinal: Negative.   Neurological: Negative.   Psychiatric/Behavioral: Negative.  Negative for decreased concentration. The patient is not nervous/anxious.      Today's Vitals   11/21/20 1553  BP: 118/76  Pulse: 94  Temp: 98.4 F (36.9 C)  TempSrc: Oral  Weight: 211 lb 3.2 oz (95.8 kg)  Height: 5\' 5"  (1.651 m)   Body mass index is 35.15 kg/m.  Wt Readings from Last 3 Encounters:  11/21/20 211 lb 3.2 oz (95.8 kg)  04/12/20 201 lb (91.2 kg)  04/10/20 200 lb (90.7 kg)   Objective:  Physical Exam Vitals and nursing note reviewed.  Constitutional:      Appearance: Normal  appearance. She is obese.  HENT:     Head: Normocephalic and atraumatic.  Cardiovascular:     Rate and Rhythm: Normal rate and regular rhythm.     Heart sounds: Normal heart sounds.  Pulmonary:     Effort: Pulmonary effort is normal.     Breath sounds: Normal breath sounds.  Skin:    General: Skin is warm.  Neurological:     General: No focal deficit present.     Mental Status: She is alert.  Psychiatric:        Mood and Affect: Mood normal.        Behavior: Behavior normal.         Assessment And Plan:     1. Pure hypercholesterolemia Comments: Chronic, I will check non-fasting lipid panel today. She is encouraged to avoid fried foods and increase daily activity. I will make further recommendations once her labs are available for review.  - NON FORMULARY; 2 capsules. Lipid factor from [facility] - Lipid panel  2. Anxiety Comments: Chronic, refill for citalopram was sent to her local pharmacy. She will f/u in six months for re-evaluation. Encouraged to incorporate more ME time into her weekly routine.  - citalopram (CELEXA) 10 MG tablet; Take 1 tablet (10 mg total) by mouth daily.  Dispense: 90 tablet; Refill: 2  3. Class 2 obesity due to excess calories without serious comorbidity with body mass index (BMI) of 35.0 to 35.9 in adult Comments: She is interested in resuming Qsymia. I think she may also benefit from [facility]. She denies personal/family history of thyroid cancer. She prefers to think about this more. I did review possible side effects with the patient. She will get back to me once she thinks about this further. She is encouraged to resume her regular exercise routine.   Patient was given opportunity to ask questions. Patient verbalized understanding of the plan and was able to repeat key elements of the plan. All questions were answered to their satisfaction.  Maximino Greenland, MD   I, Maximino Greenland, MD, have reviewed all documentation for this visit. The documentation  on 11/21/20 for the exam, diagnosis, procedures, and orders are all accurate and complete.  THE PATIENT IS ENCOURAGED TO PRACTICE SOCIAL DISTANCING DUE TO THE COVID-19 PANDEMIC.

## 2020-11-21 NOTE — Patient Instructions (Signed)
High Cholesterol  High cholesterol is a condition in which the blood has high levels of a white, waxy substance similar to fat (cholesterol). The liver makes all the cholesterol that the body needs. The human body needs small amounts of cholesterol to help build cells. A person gets extra or excess cholesterol from the food that he or she eats. The blood carries cholesterol from the liver to the rest of the body. If you have high cholesterol, deposits (plaques) may build up on the walls of your arteries. Arteries are the blood vessels that carry blood away from your heart. These plaques make the arteries narrow and stiff. Cholesterol plaques increase your risk for heart attack and stroke. Work with your health care provider to keep your cholesterol levels in a healthy range. What increases the risk? The following factors may make you more likely to develop this condition:  Eating foods that are high in animal fat (saturated fat) or cholesterol.  Being overweight.  Not getting enough exercise.  A family history of high cholesterol (familial hypercholesterolemia).  Use of tobacco products.  Having diabetes. What are the signs or symptoms? There are no symptoms of this condition. How is this diagnosed? This condition may be diagnosed based on the results of a blood test.  If you are older than 59 years of age, your health care provider may check your cholesterol levels every 4-6 years.  You may be checked more often if you have high cholesterol or other risk factors for heart disease. The blood test for cholesterol measures:  "Bad" cholesterol, or LDL cholesterol. This is the main type of cholesterol that causes heart disease. The desired level is less than 100 mg/dL.  "Good" cholesterol, or HDL cholesterol. HDL helps protect against heart disease by cleaning the arteries and carrying the LDL to the liver for processing. The desired level for HDL is 60 mg/dL or higher.  Triglycerides.  These are fats that your body can store or burn for energy. The desired level is less than 150 mg/dL.  Total cholesterol. This measures the total amount of cholesterol in your blood and includes LDL, HDL, and triglycerides. The desired level is less than 200 mg/dL. How is this treated? This condition may be treated with:  Diet changes. You may be asked to eat foods that have more fiber and less saturated fats or added sugar.  Lifestyle changes. These may include regular exercise, maintaining a healthy weight, and quitting use of tobacco products.  Medicines. These are given when diet and lifestyle changes have not worked. You may be prescribed a statin medicine to help lower your cholesterol levels. Follow these instructions at home: Eating and drinking  Eat a healthy, balanced diet. This diet includes: ? Daily servings of a variety of fresh, frozen, or canned fruits and vegetables. ? Daily servings of whole grain foods that are rich in fiber. ? Foods that are low in saturated fats and trans fats. These include poultry and fish without skin, lean cuts of meat, and low-fat dairy products. ? A variety of fish, especially oily fish that contain omega-3 fatty acids. Aim to eat fish at least 2 times a week.  Avoid foods and drinks that have added sugar.  Use healthy cooking methods, such as roasting, grilling, broiling, baking, poaching, steaming, and stir-frying. Do not fry your food except for stir-frying.   Lifestyle  Get regular exercise. Aim to exercise for a total of 150 minutes a week. Increase your activity level by doing activities   such as gardening, walking, and taking the stairs.  Do not use any products that contain nicotine or tobacco, such as cigarettes, e-cigarettes, and chewing tobacco. If you need help quitting, ask your health care provider.   General instructions  Take over-the-counter and prescription medicines only as told by your health care provider.  Keep all  follow-up visits as told by your health care provider. This is important. Where to find more information  American Heart Association: www.heart.org  National Heart, Lung, and Blood Institute: www.nhlbi.nih.gov Contact a health care provider if:  You have trouble achieving or maintaining a healthy diet or weight.  You are starting an exercise program.  You are unable to stop smoking. Get help right away if:  You have chest pain.  You have trouble breathing.  You have any symptoms of a stroke. "BE FAST" is an easy way to remember the main warning signs of a stroke: ? B - Balance. Signs are dizziness, sudden trouble walking, or loss of balance. ? E - Eyes. Signs are trouble seeing or a sudden change in vision. ? F - Face. Signs are sudden weakness or numbness of the face, or the face or eyelid drooping on one side. ? A - Arms. Signs are weakness or numbness in an arm. This happens suddenly and usually on one side of the body. ? S - Speech. Signs are sudden trouble speaking, slurred speech, or trouble understanding what people say. ? T - Time. Time to call emergency services. Write down what time symptoms started.  You have other signs of a stroke, such as: ? A sudden, severe headache with no known cause. ? Nausea or vomiting. ? Seizure. These symptoms may represent a serious problem that is an emergency. Do not wait to see if the symptoms will go away. Get medical help right away. Call your local emergency services (911 in the [place] Do not drive yourself to the hospital. Summary  Cholesterol plaques increase your risk for heart attack and stroke. Work with your health care provider to keep your cholesterol levels in a healthy range.  Eat a healthy, balanced diet, get regular exercise, and maintain a healthy weight.  Do not use any products that contain nicotine or tobacco, such as cigarettes, e-cigarettes, and chewing tobacco.  Get help right away if you have any symptoms of a  stroke. This information is not intended to replace advice given to you by your health care provider. Make sure you discuss any questions you have with your health care provider. Document Revised: 08/23/2019 Document Reviewed: 08/23/2019 Elsevier Patient Education  2021 [employer]  

## 2020-11-22 LAB — LIPID PANEL
Chol/HDL Ratio: 3.3 ratio (ref 0.0–4.4)
Cholesterol, Total: 230 mg/dL — ABNORMAL HIGH (ref 100–199)
HDL: 70 mg/dL (ref >39)
LDL Chol Calc (NIH): 137 mg/dL — ABNORMAL HIGH (ref 0–99)
Triglycerides: 132 mg/dL (ref 0–149)
VLDL Cholesterol Cal: 23 mg/dL (ref 5–40)

## 2020-11-23 ENCOUNTER — Encounter: Payer: Self-pay | Admitting: Internal Medicine

## 2020-11-28 ENCOUNTER — Other Ambulatory Visit: Payer: Self-pay | Admitting: Internal Medicine

## 2020-11-28 ENCOUNTER — Other Ambulatory Visit: Payer: Self-pay

## 2020-11-28 MED ORDER — ATORVASTATIN CALCIUM 20 MG PO TABS
ORAL_TABLET | ORAL | 1 refills | Status: DC
Start: 2020-11-28 — End: 2020-11-28

## 2020-11-28 MED FILL — ATORVASTATIN CALCIUM 20 MG: 20 | 84 days supply | Qty: 72 | Fill #0

## 2021-01-04 MED FILL — CITALOPRAM HBR 10 MG TABLET: 10 | 90 days supply | Qty: 90 | Fill #2

## 2021-01-06 ENCOUNTER — Other Ambulatory Visit (HOSPITAL_COMMUNITY): Payer: Self-pay

## 2021-01-17 ENCOUNTER — Telehealth: Payer: Self-pay

## 2021-01-17 ENCOUNTER — Encounter: Payer: Self-pay | Admitting: Internal Medicine

## 2021-01-17 NOTE — Telephone Encounter (Signed)
The patient was scheduled for a virtual appointment with her consent.  The pt is having sinus infection symptoms and has been diagnosed with covid.

## 2021-01-18 ENCOUNTER — Other Ambulatory Visit (HOSPITAL_COMMUNITY): Payer: Self-pay

## 2021-01-18 ENCOUNTER — Telehealth (INDEPENDENT_AMBULATORY_CARE_PROVIDER_SITE_OTHER): Payer: 59 | Admitting: Internal Medicine

## 2021-01-18 ENCOUNTER — Encounter: Payer: Self-pay | Admitting: Internal Medicine

## 2021-01-18 VITALS — BP 127/79 | HR 88 | Temp 98.8°F

## 2021-01-18 DIAGNOSIS — U071 COVID-19: Secondary | ICD-10-CM | POA: Diagnosis not present

## 2021-01-18 MED ORDER — PREDNISONE 10 MG (21) PO TBPK: ORAL_TABLET | ORAL | 0 refills | Status: DC

## 2021-01-18 NOTE — Progress Notes (Signed)
Virtual Visit via Visit   This visit type was conducted due to national recommendations for restrictions regarding the COVID-19 Pandemic (e.g. social distancing) in an effort to limit this patient's exposure and mitigate transmission in our community.  Due to her co-morbid illnesses, this patient is at least at moderate risk for complications without adequate follow up.  This format is felt to be most appropriate for this patient at this time.  All issues noted in this document were discussed and addressed.  A limited physical exam was performed with this format.    This visit type was conducted due to national recommendations for restrictions regarding the COVID-19 Pandemic (e.g. social distancing) in an effort to limit this patient's exposure and mitigate transmission in our community.  Patients identity confirmed using two different identifiers.  This format is felt to be most appropriate for this patient at this time.  All issues noted in this document were discussed and addressed.  No physical exam was performed (except for noted visual exam findings with Video Visits).    Date:  01/22/2021   ID:  Victoria Herman, DOB [birth year], MRN [id number]  Patient Location:  Home  Provider location:   Office    Chief Complaint:  "I have COVID"  History of Present Illness:    Victoria Herman is a 60 y.o. female who presents via video conferencing for a telehealth visit today.    The patient does have symptoms concerning for COVID-19 infection (fever, chills, cough, or new shortness of breath).   She presents today for virtual visit. She prefers this method of contact due to COVID-19 pandemic.  She tested positive at home with a rapid test on Tuesday, April 12th. She reports she and her family recently went to the beach for the weekend.  She reports her daughter was diagnosed on April 7th. Additionally, her husband was diagnosed on the 10th. Her bothersome sx are ear pain and congestion. She  reports despite taking Sudafed she has been unable to sleep because she has difficulty breathing. She has not lost taste/smell. She is miserable due to the congestion. She feels that prednisone would help her sx.  She has had similar sx in the past when having a "sinus infection" and prednisone has been effective.   Other This is a new problem. The current episode started in the past 7 days. Associated symptoms include congestion and a sore throat. She has tried acetaminophen and NSAIDs for the symptoms. The treatment provided moderate relief.     Past Medical History:  Diagnosis Date  . Allergy   . Anxiety   . GERD (gastroesophageal reflux disease)   . Hyperlipidemia   . Seasonal allergies   . Superficial thrombophlebitis 03-2014, 07-2014   Past Surgical History:  Procedure Laterality Date  . COLONOSCOPY    . ORIF WRIST FRACTURE Left 04/12/2020   Procedure: OPEN REDUCTION INTERNAL FIXATION (ORIF) WRIST FRACTURE and repair reconstruction as indicated;  Surgeon: Iran Planas, MD;  Location: [facility];  Service: Orthopedics;  Laterality: Left;  with IV sedation  Needs 2 hours  . WISDOM TOOTH EXTRACTION       Current Meds  Medication Sig  . atorvastatin (LIPITOR) 20 MG tablet TAKE 1 TABLET BY MOUTH ONCE DAILY FROM MONDAY TO SATURDAY. SKIP SUNDAYS.  Jolyne Loa Grape-Goldenseal (BERBERINE COMPLEX PO) Take 2 tablets by mouth daily.  . Calcium Carb-Cholecalciferol (CALCIUM 600 + D PO) Take 1 tablet by mouth daily. Vit D 12.5  . cetirizine (ZYRTEC) 10  MG tablet Take 10 mg by mouth daily.  . Cholecalciferol (VITAMIN D3 SUPER STRENGTH) 50 MCG (2000 UT) CAPS Take 2,000 Units by mouth daily.  . citalopram (CELEXA) 10 MG tablet TAKE 1 TABLET (10 MG TOTAL) BY MOUTH DAILY.  Marland Kitchen EPINEPHrine 0.3 mg/0.3 mL IJ SOAJ injection Inject 0.3 mg into the skin once as needed.  . mometasone (NASONEX) 50 MCG/ACT nasal spray Place 2 sprays into the nose daily as needed (allergies).   . NON FORMULARY 2 capsules.  Lipid factor from [facility]  . predniSONE (STERAPRED UNI-PAK 21 TAB) 10 MG (21) TBPK tablet Dispense as a 6 day dose pack; use as directed     Allergies:   E-mycin [erythromycin] and Sulfa antibiotics   Social History   Tobacco Use  . Smoking status: Never Smoker  . Smokeless tobacco: Never Used  Vaping Use  . Vaping Use: Never used  Substance Use Topics  . Alcohol use: Yes    Comment: occas.  . Drug use: No     Family Hx: The patient's family history includes Cancer in her father; Diabetes in her mother; Hypertension in her brother, brother, and mother; Varicose Veins in her brother, brother, father, and mother. There is no history of Colon cancer, Esophageal cancer, Rectal cancer, Stomach cancer, or Breast cancer.  ROS:   Please see the history of present illness.    Review of Systems  Constitutional: Negative.   HENT: Positive for congestion, ear pain and sore throat.   Respiratory: Negative.   Cardiovascular: Negative.   Gastrointestinal: Negative.   Neurological: Negative.   Psychiatric/Behavioral: Negative.     All other systems reviewed and are negative.   Labs/Other Tests and Data Reviewed:    Recent Labs: 04/03/2020: ALT 14; BUN 19; Creatinine, Ser 0.68; Hemoglobin 13.8; Platelets 297; Potassium 5.2; Sodium 139   Recent Lipid Panel Lab Results  Component Value Date/Time   CHOL 230 (H) 11/21/2020 04:39 PM   TRIG 132 11/21/2020 04:39 PM   HDL 70 11/21/2020 04:39 PM   CHOLHDL 3.3 11/21/2020 04:39 PM   LDLCALC 137 (H) 11/21/2020 04:39 PM    Wt Readings from Last 3 Encounters:  11/21/20 211 lb 3.2 oz (95.8 kg)  04/12/20 201 lb (91.2 kg)  04/10/20 200 lb (90.7 kg)     Exam:    Vital Signs:  BP 127/79 Comment: pt provided  Pulse 88 Comment: pt provided  Temp 98.8 F (37.1 C) Comment: pt provided  LMP 07/23/2013   SpO2 95% Comment: pt provided    Physical Exam Vitals and nursing note reviewed.  HENT:     Head: Normocephalic and atraumatic.      Comments: Sounds congested    Nose:     Comments: Masked     Mouth/Throat:     Comments: Masked  Eyes:     Extraocular Movements: Extraocular movements intact.  Pulmonary:     Effort: Pulmonary effort is normal.  Musculoskeletal:     Cervical back: Normal range of motion.  Neurological:     Mental Status: She is alert and oriented to person, place, and time.  Psychiatric:        Mood and Affect: Affect normal.    / ASSESSMENT & PLAN:    1. COVID Comments: Pt advised that sinus congestion is common sx with new variant. Her sx will likely improve over next 72 hours. Will send rx as requested. Encouraged to stay well hydrated, increase intake of hot liquids w/ lemon and to avoid dairy  products. Agrees to [employer] daily. She will also take Oscillococcinum as directed. Advised to go to ER should she develop worsening SOB. Plans to check pulse ox daily. I will also send her for antibody treatment. Pt advised they will contact her and determine if she is a candidate.    - Ambulatory referral for Covid Treatment - Temperature monitoring; Future    COVID-19 Education: The signs and symptoms of COVID-19 were discussed with the patient and how to seek care for testing (follow up with PCP or arrange E-visit).  The importance of social distancing was discussed today.  Patient Risk:   After full review of this patients clinical status, I feel that they are at least moderate risk at this time.  Time:   Today, I have spent 13 minutes/ seconds with the patient with telehealth technology discussing above diagnoses.     Medication Adjustments/Labs and Tests Ordered: Current medicines are reviewed at length with the patient today.  Concerns regarding medicines are outlined above.   Tests Ordered: Orders Placed This Encounter  Procedures  . Ambulatory referral for Covid Treatment    Medication Changes: Meds ordered this encounter  Medications  . predniSONE (STERAPRED UNI-PAK 21  TAB) 10 MG (21) TBPK tablet    Sig: Dispense as a 6 day dose pack; use as directed    Dispense:  21 tablet    Refill:  0    Disposition:  Follow up prn  Signed, Maximino Greenland, MD

## 2021-01-19 ENCOUNTER — Other Ambulatory Visit (HOSPITAL_COMMUNITY): Payer: Self-pay

## 2021-01-19 ENCOUNTER — Telehealth: Payer: Self-pay | Admitting: Nurse Practitioner

## 2021-01-19 NOTE — Telephone Encounter (Signed)
Called to Discuss with patient about Covid symptoms and the use of the monoclonal antibody/antiviral infusion/oral therapies for those with mild to moderate Covid symptoms and at a high risk of hospitalization.     Pt appears to qualify for this infusion due to co-morbid conditions and/or a member of an at-risk group in accordance with the FDA Emergency Use Authorization.   Unable to reach pt Voicemail left and My Chart message sent.   Symptom onset: 01/16/2021 Vaccinated: Yes Booster:Yes Immunocompromised:No Qualifiers: hld, Nurse  Alda Lea, NP [facility]  [phone]

## 2021-01-22 ENCOUNTER — Ambulatory Visit: Payer: 59 | Admitting: Internal Medicine

## 2021-02-10 DIAGNOSIS — H524 Presbyopia: Secondary | ICD-10-CM | POA: Diagnosis not present

## 2021-02-12 MED FILL — Atorvastatin Calcium Tab 20 MG (Base Equivalent): ORAL | 84 days supply | Qty: 72 | Fill #0 | Status: AC

## 2021-02-13 ENCOUNTER — Other Ambulatory Visit (HOSPITAL_COMMUNITY): Payer: Self-pay

## 2021-02-26 ENCOUNTER — Other Ambulatory Visit: Payer: Self-pay

## 2021-02-26 ENCOUNTER — Encounter: Payer: Self-pay | Admitting: Internal Medicine

## 2021-02-26 ENCOUNTER — Ambulatory Visit (INDEPENDENT_AMBULATORY_CARE_PROVIDER_SITE_OTHER): Payer: 59 | Admitting: Internal Medicine

## 2021-02-26 VITALS — BP 110/78 | HR 75 | Temp 98.3°F | Ht 65.0 in | Wt 212.6 lb

## 2021-02-26 DIAGNOSIS — Z6835 Body mass index (BMI) 35.0-35.9, adult: Secondary | ICD-10-CM

## 2021-02-26 DIAGNOSIS — Z8249 Family history of ischemic heart disease and other diseases of the circulatory system: Secondary | ICD-10-CM

## 2021-02-26 DIAGNOSIS — I7300 Raynaud's syndrome without gangrene: Secondary | ICD-10-CM

## 2021-02-26 DIAGNOSIS — E7800 Pure hypercholesterolemia, unspecified: Secondary | ICD-10-CM | POA: Diagnosis not present

## 2021-02-26 DIAGNOSIS — E6609 Other obesity due to excess calories: Secondary | ICD-10-CM | POA: Diagnosis not present

## 2021-02-26 NOTE — Patient Instructions (Signed)
High Cholesterol  High cholesterol is a condition in which the blood has high levels of a white, waxy substance similar to fat (cholesterol). The liver makes all the cholesterol that the body needs. The human body needs small amounts of cholesterol to help build cells. A person gets extra or excess cholesterol from the food that he or she eats. The blood carries cholesterol from the liver to the rest of the body. If you have high cholesterol, deposits (plaques) may build up on the walls of your arteries. Arteries are the blood vessels that carry blood away from your heart. These plaques make the arteries narrow and stiff. Cholesterol plaques increase your risk for heart attack and stroke. Work with your health care provider to keep your cholesterol levels in a healthy range. What increases the risk? The following factors may make you more likely to develop this condition:  Eating foods that are high in animal fat (saturated fat) or cholesterol.  Being overweight.  Not getting enough exercise.  A family history of high cholesterol (familial hypercholesterolemia).  Use of tobacco products.  Having diabetes. What are the signs or symptoms? There are no symptoms of this condition. How is this diagnosed? This condition may be diagnosed based on the results of a blood test.  If you are older than 59 years of age, your health care provider may check your cholesterol levels every 4-6 years.  You may be checked more often if you have high cholesterol or other risk factors for heart disease. The blood test for cholesterol measures:  "Bad" cholesterol, or LDL cholesterol. This is the main type of cholesterol that causes heart disease. The desired level is less than 100 mg/dL.  "Good" cholesterol, or HDL cholesterol. HDL helps protect against heart disease by cleaning the arteries and carrying the LDL to the liver for processing. The desired level for HDL is 60 mg/dL or higher.  Triglycerides.  These are fats that your body can store or burn for energy. The desired level is less than 150 mg/dL.  Total cholesterol. This measures the total amount of cholesterol in your blood and includes LDL, HDL, and triglycerides. The desired level is less than 200 mg/dL. How is this treated? This condition may be treated with:  Diet changes. You may be asked to eat foods that have more fiber and less saturated fats or added sugar.  Lifestyle changes. These may include regular exercise, maintaining a healthy weight, and quitting use of tobacco products.  Medicines. These are given when diet and lifestyle changes have not worked. You may be prescribed a statin medicine to help lower your cholesterol levels. Follow these instructions at home: Eating and drinking  Eat a healthy, balanced diet. This diet includes: ? Daily servings of a variety of fresh, frozen, or canned fruits and vegetables. ? Daily servings of whole grain foods that are rich in fiber. ? Foods that are low in saturated fats and trans fats. These include poultry and fish without skin, lean cuts of meat, and low-fat dairy products. ? A variety of fish, especially oily fish that contain omega-3 fatty acids. Aim to eat fish at least 2 times a week.  Avoid foods and drinks that have added sugar.  Use healthy cooking methods, such as roasting, grilling, broiling, baking, poaching, steaming, and stir-frying. Do not fry your food except for stir-frying.   Lifestyle  Get regular exercise. Aim to exercise for a total of 150 minutes a week. Increase your activity level by doing activities   such as gardening, walking, and taking the stairs.  Do not use any products that contain nicotine or tobacco, such as cigarettes, e-cigarettes, and chewing tobacco. If you need help quitting, ask your health care provider.   General instructions  Take over-the-counter and prescription medicines only as told by your health care provider.  Keep all  follow-up visits as told by your health care provider. This is important. Where to find more information  American Heart Association: www.heart.org  National Heart, Lung, and Blood Institute: www.nhlbi.nih.gov Contact a health care provider if:  You have trouble achieving or maintaining a healthy diet or weight.  You are starting an exercise program.  You are unable to stop smoking. Get help right away if:  You have chest pain.  You have trouble breathing.  You have any symptoms of a stroke. "BE FAST" is an easy way to remember the main warning signs of a stroke: ? B - Balance. Signs are dizziness, sudden trouble walking, or loss of balance. ? E - Eyes. Signs are trouble seeing or a sudden change in vision. ? F - Face. Signs are sudden weakness or numbness of the face, or the face or eyelid drooping on one side. ? A - Arms. Signs are weakness or numbness in an arm. This happens suddenly and usually on one side of the body. ? S - Speech. Signs are sudden trouble speaking, slurred speech, or trouble understanding what people say. ? T - Time. Time to call emergency services. Write down what time symptoms started.  You have other signs of a stroke, such as: ? A sudden, severe headache with no known cause. ? Nausea or vomiting. ? Seizure. These symptoms may represent a serious problem that is an emergency. Do not wait to see if the symptoms will go away. Get medical help right away. Call your local emergency services (911 in the [place] Do not drive yourself to the hospital. Summary  Cholesterol plaques increase your risk for heart attack and stroke. Work with your health care provider to keep your cholesterol levels in a healthy range.  Eat a healthy, balanced diet, get regular exercise, and maintain a healthy weight.  Do not use any products that contain nicotine or tobacco, such as cigarettes, e-cigarettes, and chewing tobacco.  Get help right away if you have any symptoms of a  stroke. This information is not intended to replace advice given to you by your health care provider. Make sure you discuss any questions you have with your health care provider. Document Revised: 08/23/2019 Document Reviewed: 08/23/2019 Elsevier Patient Education  2021 [employer]  

## 2021-02-26 NOTE — Progress Notes (Signed)
I,Katawbba Wiggins,acting as a Education administrator for Maximino Greenland, MD.,have documented all relevant documentation on the behalf of Maximino Greenland, MD,as directed by  Maximino Greenland, MD while in the presence of Maximino Greenland, MD.  This visit occurred during the SARS-CoV-2 public health emergency.  Safety protocols were in place, including screening questions prior to the visit, additional usage of staff PPE, and extensive cleaning of exam room while observing appropriate contact time as indicated for disinfecting solutions.  Subjective:     Patient ID: Victoria Herman , female    DOB: 1961-08-15 , 59 y.o.   MRN: [id number]   Chief Complaint  Patient presents with  . Hyperlipidemia    HPI  The patient is here today for a follow-up on her cholesterol.  She was started on atorvastatin at her last visit. She has not had any issues with the medication.   Hyperlipidemia This is a chronic problem. The current episode started more than 1 year ago. The problem is uncontrolled. Recent lipid tests were reviewed and are high. Exacerbating diseases include obesity. Current antihyperlipidemic treatment includes herbal therapy. Compliance problems include adherence to exercise.      Past Medical History:  Diagnosis Date  . Allergy   . Anxiety   . GERD (gastroesophageal reflux disease)   . Hyperlipidemia   . Seasonal allergies   . Superficial thrombophlebitis 03-2014, 07-2014     Family History  Problem Relation Age of Onset  . Hypertension Mother   . Diabetes Mother   . Varicose Veins Mother   . Varicose Veins Father   . Cancer Father   . Varicose Veins Brother   . Hypertension Brother   . Hypertension Brother   . Varicose Veins Brother   . Colon cancer Neg Hx   . Esophageal cancer Neg Hx   . Rectal cancer Neg Hx   . Stomach cancer Neg Hx   . Breast cancer Neg Hx      Current Outpatient Medications:  .  atorvastatin (LIPITOR) 20 MG tablet, TAKE 1 TABLET BY MOUTH ONCE DAILY FROM MONDAY  TO SATURDAY. SKIP SUNDAYS., Disp: 90 tablet, Rfl: 1 .  Barberry-Oreg Grape-Goldenseal (BERBERINE COMPLEX PO), Take 2 tablets by mouth daily., Disp: , Rfl:  .  Calcium Carb-Cholecalciferol (CALCIUM 600 + D PO), Take 1 tablet by mouth daily. Vit D 12.5, Disp: , Rfl:  .  cetirizine (ZYRTEC) 10 MG tablet, Take 10 mg by mouth daily., Disp: , Rfl:  .  Cholecalciferol (VITAMIN D3 SUPER STRENGTH) 50 MCG (2000 UT) CAPS, Take 2,000 Units by mouth daily., Disp: , Rfl:  .  citalopram (CELEXA) 10 MG tablet, TAKE 1 TABLET (10 MG TOTAL) BY MOUTH DAILY., Disp: 90 tablet, Rfl: 2 .  EPINEPHrine 0.3 mg/0.3 mL IJ SOAJ injection, Inject 0.3 mg into the skin once as needed., Disp: , Rfl:  .  mometasone (NASONEX) 50 MCG/ACT nasal spray, Place 2 sprays into the nose daily as needed (allergies). , Disp: , Rfl:  .  NON FORMULARY, 2 capsules. Lipid factor from [facility], Disp: , Rfl:    Allergies  Allergen Reactions  . E-Mycin [Erythromycin] Diarrhea  . Sulfa Antibiotics Rash     Review of Systems  Constitutional: Negative.   Respiratory: Negative.   Cardiovascular: Negative.   Gastrointestinal: Negative.   Skin: Positive for color change.       States hands/feet turn colors in the cold. This has been going on for 20 years. She admits that she is first mentioning  this today. She reports her daughter has similar symptoms. Has no known h/o autoimmune disease.   Neurological: Negative.   Hematological:       She reports family history of blood clots. Decided she needed to tell me today, has not discussed this in the past. States her mother had miscarriages and had blood clot/PE after hysterectomy. Older brother has also had DVT. She is not sure if any other family members have this history. She denies h/o miscarriages. She has had superficial blood clots in the past when traveling to [place]. Wants to now have further evaluation.   Psychiatric/Behavioral: Negative.      Today's Vitals   02/26/21 1541  BP: 110/78   Pulse: 75  Temp: 98.3 F (36.8 C)  TempSrc: Oral  Weight: 212 lb 9.6 oz (96.4 kg)  Height: 5\' 5"  (1.651 m)   Body mass index is 35.38 kg/m.  Wt Readings from Last 3 Encounters:  02/26/21 212 lb 9.6 oz (96.4 kg)  11/21/20 211 lb 3.2 oz (95.8 kg)  04/12/20 201 lb (91.2 kg)   BP Readings from Last 3 Encounters:  02/26/21 110/78  01/18/21 127/79  11/21/20 118/76   Objective:  Physical Exam Vitals and nursing note reviewed.  Constitutional:      Appearance: Normal appearance. She is obese.  HENT:     Head: Normocephalic and atraumatic.     Nose:     Comments: Masked     Mouth/Throat:     Comments: Masked  Eyes:     Extraocular Movements: Extraocular movements intact.  Cardiovascular:     Rate and Rhythm: Normal rate and regular rhythm.     Heart sounds: Normal heart sounds.  Pulmonary:     Effort: Pulmonary effort is normal.     Breath sounds: Normal breath sounds.  Musculoskeletal:     Cervical back: Normal range of motion.  Skin:    General: Skin is warm.     Capillary Refill: Capillary refill takes more than 3 seconds.     Comments: Fingertips are blue tint  Neurological:     General: No focal deficit present.     Mental Status: She is alert.  Psychiatric:        Mood and Affect: Mood normal.        Behavior: Behavior normal.         Assessment And Plan:     1. Pure hypercholesterolemia Comments: I will check non-fasting lipid panel and LFTs today. She will rto in July 2022 for her next physical examination.  - Lipid panel - ALT  2. Raynaud's phenomenon without gangrene Comments: I will check ANA. I will also refer her to Rheum for further evaluation. Advised to keep extremities warm in cold weather. Pt advised she may benefit from low dose amlodipine in the future if her sx worsen.  - ANA, IFA (with reflex) - Ambulatory referral to Rheumatology  3. Class 2 obesity due to excess calories without serious comorbidity with body mass index (BMI) of 35.0  to 35.9 in adult Comments:  She is encouraged to strive for BMI less than 30 to decrease cardiac risk. Advised to aim for at least 150 minutes of exercise per week.  4. Family history of blood clots Comments: I will check labs as listed below.  I will also refer her to Hematology for further evaluation.  - Factor 5 Mutation Leiden - Antithrombin panel - Lupus Anticoagulant Panel  Patient was given opportunity to ask questions. Patient verbalized understanding  of the plan and was able to repeat key elements of the plan. All questions were answered to their satisfaction.   I, Maximino Greenland, MD, have reviewed all documentation for this visit. The documentation on 02/26/21 for the exam, diagnosis, procedures, and orders are all accurate and complete.   IF YOU HAVE BEEN REFERRED TO A SPECIALIST, IT MAY TAKE 1-2 WEEKS TO SCHEDULE/PROCESS THE REFERRAL. IF YOU HAVE NOT HEARD FROM US/SPECIALIST IN TWO WEEKS, PLEASE GIVE [place] A CALL AT [phone].   THE PATIENT IS ENCOURAGED TO PRACTICE SOCIAL DISTANCING DUE TO THE COVID-19 PANDEMIC.

## 2021-03-02 LAB — LIPID PANEL
Chol/HDL Ratio: 2.1 ratio (ref 0.0–4.4)
Cholesterol, Total: 165 mg/dL (ref 100–199)
HDL: 78 mg/dL (ref >39)
LDL Chol Calc (NIH): 72 mg/dL (ref 0–99)
Triglycerides: 79 mg/dL (ref 0–149)
VLDL Cholesterol Cal: 15 mg/dL (ref 5–40)

## 2021-03-02 LAB — ANTITHROMBIN PANEL
AT III AG PPP IMM-ACNC: 86 % (ref 72–124)
AntiThromb III Func: 103 % (ref 75–135)

## 2021-03-02 LAB — ANTINUCLEAR ANTIBODIES, IFA: ANA Titer 1: NEGATIVE

## 2021-03-02 LAB — LUPUS ANTICOAGULANT PANEL
Dilute Viper Venom Time: 40.7 s (ref 0.0–47.0)
PTT Lupus Anticoagulant: 38.1 s (ref 0.0–51.9)

## 2021-03-02 LAB — ALT: ALT: 18 IU/L (ref 0–32)

## 2021-03-02 LAB — FACTOR 5 LEIDEN

## 2021-03-06 ENCOUNTER — Encounter: Payer: Self-pay | Admitting: Internal Medicine

## 2021-03-15 DIAGNOSIS — E785 Hyperlipidemia, unspecified: Secondary | ICD-10-CM | POA: Diagnosis not present

## 2021-03-15 DIAGNOSIS — R635 Abnormal weight gain: Secondary | ICD-10-CM | POA: Diagnosis not present

## 2021-03-15 DIAGNOSIS — E559 Vitamin D deficiency, unspecified: Secondary | ICD-10-CM | POA: Diagnosis not present

## 2021-03-15 DIAGNOSIS — N951 Menopausal and female climacteric states: Secondary | ICD-10-CM | POA: Diagnosis not present

## 2021-03-17 ENCOUNTER — Telehealth: Payer: 59 | Admitting: Nurse Practitioner

## 2021-03-17 DIAGNOSIS — J069 Acute upper respiratory infection, unspecified: Secondary | ICD-10-CM | POA: Diagnosis not present

## 2021-03-17 MED ORDER — PREDNISONE 10 MG (21) PO TBPK: ORAL_TABLET | ORAL | 0 refills | Status: DC

## 2021-03-17 MED ORDER — BENZONATATE 100 MG PO CAPS: 100.0000 mg | ORAL_CAPSULE | Freq: Three times a day (TID) | ORAL | 0 refills | Status: DC | PRN

## 2021-03-17 NOTE — Progress Notes (Signed)
We are sorry that you are not feeling well.  Here is how we plan to help!  Based on your presentation I believe you most likely have A cough due to a virus.  This is called viral bronchitis and is best treated by rest, plenty of fluids and control of the cough.  You may use Ibuprofen or Tylenol as directed to help your symptoms.     In addition you may use A prescription cough medication called Tessalon Perles 100mg. You may take 1-2 capsules every 8 hours as needed for your cough.  Prednisone 10 mg daily for 6 days (see taper instructions below)  Directions for 6 day taper: Day 1: 2 tablets before breakfast, 1 after both lunch & dinner and 2 at bedtime Day 2: 1 tab before breakfast, 1 after both lunch & dinner and 2 at bedtime Day 3: 1 tab at each meal & 1 at bedtime Day 4: 1 tab at breakfast, 1 at lunch, 1 at bedtime Day 5: 1 tab at breakfast & 1 tab at bedtime Day 6: 1 tab at breakfast   From your responses in the eVisit questionnaire you describe inflammation in the upper respiratory tract which is causing a significant cough.  This is commonly called Bronchitis and has four common causes:    Allergies  Viral Infections  Acid Reflux  Bacterial Infection Allergies, viruses and acid reflux are treated by controlling symptoms or eliminating the cause. An example might be a cough caused by taking certain blood pressure medications. You stop the cough by changing the medication. Another example might be a cough caused by acid reflux. Controlling the reflux helps control the cough.  USE OF BRONCHODILATOR ("RESCUE") INHALERS: There is a risk from using your bronchodilator too frequently.  The risk is that over-reliance on a medication which only relaxes the muscles surrounding the breathing tubes can reduce the effectiveness of medications prescribed to reduce swelling and congestion of the tubes themselves.  Although you feel brief relief from the bronchodilator inhaler, your asthma may  actually be worsening with the tubes becoming more swollen and filled with mucus.  This can delay other crucial treatments, such as oral steroid medications. If you need to use a bronchodilator inhaler daily, several times per day, you should discuss this with your provider.  There are probably better treatments that could be used to keep your asthma under control.     HOME CARE . Only take medications as instructed by your medical team. . Complete the entire course of an antibiotic. . Drink plenty of fluids and get plenty of rest. . Avoid close contacts especially the very young and the elderly . Cover your mouth if you cough or cough into your sleeve. . Always remember to wash your hands . A steam or ultrasonic humidifier can help congestion.   GET HELP RIGHT AWAY IF: . You develop worsening fever. . You become short of breath . You cough up blood. . Your symptoms persist after you have completed your treatment plan MAKE SURE YOU   Understand these instructions.  Will watch your condition.  Will get help right away if you are not doing well or get worse.  Your e-visit answers were reviewed by a board certified advanced clinical practitioner to complete your personal care plan.  Depending on the condition, your plan could have included both over the counter or prescription medications. If there is a problem please reply  once you have received a response from your provider. Your   safety is important to us.  If you have drug allergies check your prescription carefully.    You can use MyChart to ask questions about today's visit, request a non-urgent call back, or ask for a work or school excuse for 24 hours related to this e-Visit. If it has been greater than 24 hours you will need to follow up with your provider, or enter a new e-Visit to address those concerns. You will get an e-mail in the next two days asking about your experience.  I hope that your e-visit has been valuable and will  speed your recovery. Thank you for using e-visits.  5-10 minutes spent reviewing and documenting in chart.  

## 2021-03-20 ENCOUNTER — Telehealth: Payer: 59 | Admitting: Nurse Practitioner

## 2021-03-20 ENCOUNTER — Other Ambulatory Visit (HOSPITAL_COMMUNITY): Payer: Self-pay

## 2021-03-20 DIAGNOSIS — J40 Bronchitis, not specified as acute or chronic: Secondary | ICD-10-CM

## 2021-03-20 MED ORDER — DOXYCYCLINE HYCLATE 100 MG PO TABS
100.0000 mg | ORAL_TABLET | Freq: Two times a day (BID) | ORAL | 0 refills | Status: AC
  Filled 2021-03-20 (×2): qty 20, 10d supply, fill #0

## 2021-03-20 MED ORDER — DOXYCYCLINE HYCLATE 100 MG PO TABS: 100.0000 mg | ORAL_TABLET | Freq: Two times a day (BID) | ORAL | 0 refills | Status: DC

## 2021-03-20 NOTE — Progress Notes (Signed)
   Based on your presentation I believe you most likely have A cough due to bacteria.  When patients have a fever and a productive cough with a change in color or increased sputum production, we are concerned about bacterial bronchitis.  If left untreated it can progress to pneumonia.  If your symptoms do not improve with your treatment plan it is important that you contact your provider.   I have prescribed Doxycycline 100 mg twice a day for 7 days     You may continue to use the cough medication that was previosuly prescribed along with the prednisone in combination with the antibiotic.      HOME CARE Only take medications as instructed by your medical team. Complete the entire course of an antibiotic. Drink plenty of fluids and get plenty of rest. Avoid close contacts especially the very young and the elderly Cover your mouth if you cough or cough into your sleeve. Always remember to wash your hands A steam or ultrasonic humidifier can help congestion.   GET HELP RIGHT AWAY IF: You develop worsening fever. You become short of breath You cough up blood. Your symptoms persist after you have completed your treatment plan MAKE SURE YOU  Understand these instructions. Will watch your condition. Will get help right away if you are not doing well or get worse.  Your e-visit answers were reviewed by a board certified advanced clinical practitioner to complete your personal care plan.  Depending on the condition, your plan could have included both over the counter or prescription medications. If there is a problem please reply  once you have received a response from your provider. Your safety is important to [place].  If you have drug allergies check your prescription carefully.    You can use MyChart to ask questions about today's visit, request a non-urgent call back, or ask for a work or school excuse for 24 hours related to this e-Visit. If it has been greater than 24 hours you will need to  follow up with your provider, or enter a new e-Visit to address those concerns. You will get an e-mail in the next two days asking about your experience.  I hope that your e-visit has been valuable and will speed your recovery. Thank you for using e-visits.   I spent approximately 7 minutes reviewing the patient's history, previous visit notes and coordinating her care   Meds ordered this encounter  Medications   doxycycline (VIBRA-TABS) 100 MG tablet    Sig: Take 1 tablet (100 mg total) by mouth 2 (two) times daily for 10 days.    Dispense:  20 tablet    Refill:  0

## 2021-03-20 NOTE — Addendum Note (Signed)
Addended by: Apolonio Schneiders E on: 03/20/2021 11:46 AM   Modules accepted: Orders

## 2021-03-28 ENCOUNTER — Other Ambulatory Visit (HOSPITAL_COMMUNITY): Payer: Self-pay

## 2021-03-28 DIAGNOSIS — J3089 Other allergic rhinitis: Secondary | ICD-10-CM | POA: Diagnosis not present

## 2021-03-29 ENCOUNTER — Other Ambulatory Visit (HOSPITAL_COMMUNITY): Payer: Self-pay

## 2021-03-29 DIAGNOSIS — K219 Gastro-esophageal reflux disease without esophagitis: Secondary | ICD-10-CM | POA: Diagnosis not present

## 2021-03-29 DIAGNOSIS — E559 Vitamin D deficiency, unspecified: Secondary | ICD-10-CM | POA: Diagnosis not present

## 2021-03-29 DIAGNOSIS — E785 Hyperlipidemia, unspecified: Secondary | ICD-10-CM | POA: Diagnosis not present

## 2021-03-29 DIAGNOSIS — Z1339 Encounter for screening examination for other mental health and behavioral disorders: Secondary | ICD-10-CM | POA: Diagnosis not present

## 2021-03-29 DIAGNOSIS — N951 Menopausal and female climacteric states: Secondary | ICD-10-CM | POA: Diagnosis not present

## 2021-03-29 DIAGNOSIS — Z1331 Encounter for screening for depression: Secondary | ICD-10-CM | POA: Diagnosis not present

## 2021-03-29 DIAGNOSIS — R635 Abnormal weight gain: Secondary | ICD-10-CM | POA: Diagnosis not present

## 2021-03-29 DIAGNOSIS — Z6837 Body mass index (BMI) 37.0-37.9, adult: Secondary | ICD-10-CM | POA: Diagnosis not present

## 2021-03-29 MED ORDER — AZELASTINE HCL 0.1 % NA SOLN
1.0000 | Freq: Two times a day (BID) | NASAL | 5 refills | Status: DC
  Filled 2021-03-29: qty 30, 25d supply, fill #0

## 2021-03-29 MED ORDER — MOMETASONE FUROATE 50 MCG/ACT NA SUSP
NASAL | 5 refills | Status: AC
  Filled 2021-03-29 – 2021-05-11 (×2): qty 17, 30d supply, fill #0

## 2021-03-29 MED ORDER — FLUTICASONE PROPIONATE 50 MCG/ACT NA SUSP
1.0000 | Freq: Every day | NASAL | 5 refills | Status: DC
  Filled 2021-03-29: qty 16, 30d supply, fill #0

## 2021-03-30 ENCOUNTER — Other Ambulatory Visit (HOSPITAL_COMMUNITY): Payer: Self-pay

## 2021-03-30 MED FILL — Citalopram Hydrobromide Tab 10 MG (Base Equiv): ORAL | 90 days supply | Qty: 90 | Fill #0 | Status: AC

## 2021-04-02 ENCOUNTER — Other Ambulatory Visit (HOSPITAL_COMMUNITY): Payer: Self-pay

## 2021-04-10 ENCOUNTER — Ambulatory Visit (INDEPENDENT_AMBULATORY_CARE_PROVIDER_SITE_OTHER): Payer: 59 | Admitting: Internal Medicine

## 2021-04-10 ENCOUNTER — Encounter: Payer: Self-pay | Admitting: Internal Medicine

## 2021-04-10 ENCOUNTER — Other Ambulatory Visit (HOSPITAL_COMMUNITY): Payer: Self-pay

## 2021-04-10 ENCOUNTER — Other Ambulatory Visit: Payer: Self-pay

## 2021-04-10 VITALS — BP 126/84 | HR 68 | Temp 98.2°F | Ht 63.8 in | Wt 213.2 lb

## 2021-04-10 DIAGNOSIS — N393 Stress incontinence (female) (male): Secondary | ICD-10-CM | POA: Diagnosis not present

## 2021-04-10 DIAGNOSIS — Z6836 Body mass index (BMI) 36.0-36.9, adult: Secondary | ICD-10-CM | POA: Diagnosis not present

## 2021-04-10 DIAGNOSIS — E6609 Other obesity due to excess calories: Secondary | ICD-10-CM | POA: Diagnosis not present

## 2021-04-10 DIAGNOSIS — Z0000 Encounter for general adult medical examination without abnormal findings: Secondary | ICD-10-CM

## 2021-04-10 DIAGNOSIS — R790 Abnormal level of blood mineral: Secondary | ICD-10-CM | POA: Diagnosis not present

## 2021-04-10 DIAGNOSIS — F419 Anxiety disorder, unspecified: Secondary | ICD-10-CM | POA: Diagnosis not present

## 2021-04-10 LAB — POCT URINALYSIS DIPSTICK
Bilirubin, UA: NEGATIVE
Blood, UA: NEGATIVE
Glucose, UA: NEGATIVE
Ketones, UA: NEGATIVE
Leukocytes, UA: NEGATIVE
Nitrite, UA: NEGATIVE
Protein, UA: NEGATIVE
Spec Grav, UA: 1.015 (ref 1.010–1.025)
Urobilinogen, UA: 0.2 E.U./dL
pH, UA: 6 (ref 5.0–8.0)

## 2021-04-10 MED ORDER — CITALOPRAM HYDROBROMIDE 10 MG PO TABS
10.0000 mg | ORAL_TABLET | Freq: Every day | ORAL | 2 refills | Status: DC
  Filled 2021-04-10 – 2021-07-18 (×2): qty 90, 90d supply, fill #0

## 2021-04-10 MED ORDER — ATORVASTATIN CALCIUM 20 MG PO TABS
20.0000 mg | ORAL_TABLET | Freq: Every day | ORAL | 2 refills | Status: DC
  Filled 2021-04-10: qty 90, 90d supply, fill #0
  Filled 2021-04-29: qty 78, 90d supply, fill #0
  Filled 2021-08-02: qty 78, 90d supply, fill #1

## 2021-04-10 NOTE — Progress Notes (Addendum)
I,Katawbba Wiggins,acting as a Education administrator for Maximino Greenland, MD.,have documented all relevant documentation on the behalf of Maximino Greenland, MD,as directed by  Maximino Greenland, MD while in the presence of Maximino Greenland, MD.  This visit occurred during the SARS-CoV-2 public health emergency.  Safety protocols were in place, including screening questions prior to the visit, additional usage of staff PPE, and extensive cleaning of exam room while observing appropriate contact time as indicated for disinfecting solutions.  Subjective:     Patient ID: Victoria Herman , female    DOB: [birth year] , 60 y.o.   MRN: [id number]   Chief Complaint  Patient presents with   Annual Exam    HPI  The patient is here for a physical examination.  Last pap was June 2020. She reports having recent bout w/ bronchitis. Her sx have improved, still has residual cough.     Past Medical History:  Diagnosis Date   Allergy    Anxiety    GERD (gastroesophageal reflux disease)    Hyperlipidemia    Seasonal allergies    Superficial thrombophlebitis 03-2014, 07-2014     Family History  Problem Relation Age of Onset   Hypertension Mother    Diabetes Mother    Varicose Veins Mother    Varicose Veins Father    Cancer Father    Varicose Veins Brother    Hypertension Brother    Hypertension Brother    Varicose Veins Brother    Colon cancer Neg Hx    Esophageal cancer Neg Hx    Rectal cancer Neg Hx    Stomach cancer Neg Hx    Breast cancer Neg Hx      Current Outpatient Medications:    azelastine (ASTELIN) 0.1 % nasal spray, Place 1-2 sprays into both nostrils 2 (two) times daily., Disp: 30 mL, Rfl: 5   Barberry-Oreg Grape-Goldenseal (BERBERINE COMPLEX PO), Take 2 tablets by mouth daily., Disp: , Rfl:    Calcium Carb-Cholecalciferol (CALCIUM 600 + D PO), Take 1 tablet by mouth daily. Vit D 12.5, Disp: , Rfl:    cetirizine (ZYRTEC) 10 MG tablet, Take 10 mg by mouth daily., Disp: , Rfl:    Cholecalciferol  (VITAMIN D3 SUPER STRENGTH) 50 MCG (2000 UT) CAPS, Take 2,000 Units by mouth daily., Disp: , Rfl:    EPINEPHrine 0.3 mg/0.3 mL IJ SOAJ injection, Inject 0.3 mg into the skin once as needed., Disp: , Rfl:    mometasone (NASONEX) 50 MCG/ACT nasal spray, Use 2 sprays in each nostril once daily, Disp: 17 g, Rfl: 5   NON FORMULARY, 2 capsules. Lipid factor from [facility], Disp: , Rfl:    atorvastatin (LIPITOR) 20 MG tablet, Take 1 tablet (20 mg total) by mouth daily Monday through Saturday, skip Sunday, Disp: 90 tablet, Rfl: 2   citalopram (CELEXA) 10 MG tablet, Take 1 tablet (10 mg total) by mouth daily., Disp: 90 tablet, Rfl: 2   Allergies  Allergen Reactions   E-Mycin [Erythromycin] Diarrhea   Sulfa Antibiotics Rash      The patient states she uses none for birth control. Last LMP was Patient's last menstrual period was 07/23/2013.. Negative for Dysmenorrhea. Negative for: breast discharge, breast lump(s), breast pain and breast self exam. Associated symptoms include abnormal vaginal bleeding. Pertinent negatives include abnormal bleeding (hematology), anxiety, decreased libido, depression, difficulty falling sleep, dyspareunia, history of infertility, nocturia, sexual dysfunction, sleep disturbances, urinary incontinence, urinary urgency, vaginal discharge and vaginal itching. Diet regular.The patient states her exercise  level is  intermittent.  . The patient's tobacco use is:  Social History   Tobacco Use  Smoking Status Never  Smokeless Tobacco Never  . She has been exposed to passive smoke. The patient's alcohol use is:  Social History   Substance and Sexual Activity  Alcohol Use Yes   Comment: occas.   Review of Systems  Constitutional: Negative.   HENT: Negative.    Eyes: Negative.   Respiratory: Negative.    Cardiovascular: Negative.   Gastrointestinal: Negative.   Endocrine: Negative.   Genitourinary: Negative.        States she has experienced stress incontinence w/ recent  bout of bronchitis. Denies dysuria or other urinary symptoms.   Musculoskeletal: Negative.   Skin: Negative.   Allergic/Immunologic: Negative.   Neurological: Negative.   Hematological: Negative.   Psychiatric/Behavioral: Negative.    All other systems reviewed and are negative.   Today's Vitals   04/10/21 0933  BP: 126/84  Pulse: 68  Temp: 98.2 F (36.8 C)  TempSrc: Oral  Weight: 213 lb 3.2 oz (96.7 kg)  Height: 5' 3.8" (1.621 m)   Body mass index is 36.83 kg/m.  Wt Readings from Last 3 Encounters:  04/10/21 213 lb 3.2 oz (96.7 kg)  02/26/21 212 lb 9.6 oz (96.4 kg)  11/21/20 211 lb 3.2 oz (95.8 kg)    BP Readings from Last 3 Encounters:  04/10/21 126/84  02/26/21 110/78  01/18/21 127/79    Objective:  Physical Exam Vitals and nursing note reviewed.  Constitutional:      Appearance: Normal appearance.  HENT:     Head: Normocephalic and atraumatic.     Right Ear: Tympanic membrane, ear canal and external ear normal.     Left Ear: Tympanic membrane, ear canal and external ear normal.     Nose:     Comments: Masked     Mouth/Throat:     Comments: Masked  Eyes:     Extraocular Movements: Extraocular movements intact.     Conjunctiva/sclera: Conjunctivae normal.     Pupils: Pupils are equal, round, and reactive to light.  Cardiovascular:     Rate and Rhythm: Normal rate and regular rhythm.     Pulses: Normal pulses.     Heart sounds: Normal heart sounds.     Comments: Spider veins b/l LE Pulmonary:     Effort: Pulmonary effort is normal.     Breath sounds: Normal breath sounds.  Abdominal:     General: Abdomen is flat. Bowel sounds are normal.     Palpations: Abdomen is soft.  Genitourinary:    Comments: deferred Musculoskeletal:        General: Normal range of motion.     Cervical back: Normal range of motion and neck supple.  Skin:    General: Skin is warm and dry.  Neurological:     General: No focal deficit present.     Mental Status: She is alert  and oriented to person, place, and time.  Psychiatric:        Mood and Affect: Mood normal.        Behavior: Behavior normal.        Assessment And Plan:     1. Routine general medical examination at health care facility Comments: A full exam was performed. Importance of monthly self breast exams was discussed with the patient. She had labwork performed June 2022 at [facility], results reviewed in full detail.  PATIENT IS ADVISED TO GET 30-45 MINUTES  REGULAR EXERCISE NO LESS THAN FOUR TO FIVE DAYS PER WEEK - BOTH WEIGHTBEARING EXERCISES AND AEROBIC ARE RECOMMENDED.  PATIENT IS ADVISED TO FOLLOW A HEALTHY DIET WITH AT LEAST SIX FRUITS/VEGGIES PER DAY, DECREASE INTAKE OF RED MEAT, AND TO INCREASE FISH INTAKE TO TWO DAYS PER WEEK.  MEATS/FISH SHOULD NOT BE FRIED, BAKED OR BROILED IS PREFERABLE.  IT IS ALSO IMPORTANT TO CUT BACK ON YOUR SUGAR INTAKE. PLEASE AVOID ANYTHING WITH ADDED SUGAR, CORN SYRUP OR OTHER SWEETENERS. IF YOU MUST USE A SWEETENER, YOU CAN TRY STEVIA. IT IS ALSO IMPORTANT TO AVOID ARTIFICIALLY SWEETENERS AND DIET BEVERAGES. LASTLY, I SUGGEST WEARING SPF 50 SUNSCREEN ON EXPOSED PARTS AND ESPECIALLY WHEN IN THE DIRECT SUNLIGHT FOR AN EXTENDED PERIOD OF TIME.  PLEASE AVOID FAST FOOD RESTAURANTS AND INCREASE YOUR WATER INTAKE.   2. Stress incontinence in female Comments: I will check u/a. She declines pelvic PT at this time. She was given pelvic exercises to perform. She will let me know if she changes her mind.  - POCT Urinalysis Dipstick (81002)  3. Abnormal serum iron level Comments: Thought to be due to famotidine use. I will check vitamin B12 level today.  - Vitamin B12  4. Anxiety Comments: Chronic, refill for citalopram was sent to her local pharmacy. She will f/u in six months for re-evaluation.  - citalopram (CELEXA) 10 MG tablet; Take 1 tablet (10 mg total) by mouth daily.  Dispense: 90 tablet; Refill: 2  5. Class 2 obesity due to excess calories without serious  comorbidity with body mass index (BMI) of 36.0 to 36.9 in adult Comments: She is encouraged to aim for at least 150 minutes of exercise per week.   Patient was given opportunity to ask questions. Patient verbalized understanding of the plan and was able to repeat key elements of the plan. All questions were answered to their satisfaction.   I, Maximino Greenland, MD, have reviewed all documentation for this visit. The documentation on 04/10/21 for the exam, diagnosis, procedures, and orders are all accurate and complete.  THE PATIENT IS ENCOURAGED TO PRACTICE SOCIAL DISTANCING DUE TO THE COVID-19 PANDEMIC.

## 2021-04-10 NOTE — Patient Instructions (Signed)

## 2021-04-11 LAB — VITAMIN B12: Vitamin B-12: 424 pg/mL (ref 232–1245)

## 2021-04-12 DIAGNOSIS — Z6837 Body mass index (BMI) 37.0-37.9, adult: Secondary | ICD-10-CM | POA: Diagnosis not present

## 2021-04-12 DIAGNOSIS — K219 Gastro-esophageal reflux disease without esophagitis: Secondary | ICD-10-CM | POA: Diagnosis not present

## 2021-04-13 ENCOUNTER — Encounter: Payer: Self-pay | Admitting: Internal Medicine

## 2021-04-16 DIAGNOSIS — J3089 Other allergic rhinitis: Secondary | ICD-10-CM | POA: Diagnosis not present

## 2021-04-16 LAB — LIPID PANEL
Cholesterol: 182 (ref 0–200)
HDL: 75 — AB (ref 35–70)
LDL Cholesterol: 92
Triglycerides: 73 (ref 40–160)

## 2021-04-16 LAB — BASIC METABOLIC PANEL
BUN: 13 (ref 4–21)
CO2: 26 — AB (ref 13–22)
Chloride: 102 (ref 99–108)
Creatinine: 0.7 (ref 0.5–1.1)
Glucose: 86
Potassium: 3.9 (ref 3.4–5.3)
Sodium: 137 (ref 137–147)

## 2021-04-16 LAB — HEPATIC FUNCTION PANEL
ALT: 13 (ref 7–35)
AST: 17 (ref 13–35)
Alkaline Phosphatase: 87 (ref 25–125)
Bilirubin, Total: 1.3

## 2021-04-16 LAB — COMPREHENSIVE METABOLIC PANEL
Albumin: 4.3 (ref 3.5–5.0)
Calcium: 9 (ref 8.7–10.7)
GFR calc Af Amer: 109
GFR calc non Af Amer: 90

## 2021-04-16 LAB — HEMOGLOBIN A1C: Hemoglobin A1C: 5.5

## 2021-04-16 LAB — CBC AND DIFFERENTIAL
HCT: 42 (ref 36–46)
Hemoglobin: 13.5 (ref 12.0–16.0)
Platelets: 212 (ref 150–399)
WBC: 7.8

## 2021-04-16 LAB — IRON,TIBC AND FERRITIN PANEL
Ferritin: 86
Iron: 26

## 2021-04-16 LAB — CBC: RBC: 4.72 (ref 3.87–5.11)

## 2021-04-16 LAB — TSH: TSH: 1.12 (ref 0.41–5.90)

## 2021-04-16 LAB — VITAMIN D 25 HYDROXY (VIT D DEFICIENCY, FRACTURES): Vit D, 25-Hydroxy: 70.4

## 2021-04-19 DIAGNOSIS — N951 Menopausal and female climacteric states: Secondary | ICD-10-CM | POA: Diagnosis not present

## 2021-04-19 DIAGNOSIS — E611 Iron deficiency: Secondary | ICD-10-CM | POA: Diagnosis not present

## 2021-04-19 DIAGNOSIS — F419 Anxiety disorder, unspecified: Secondary | ICD-10-CM | POA: Diagnosis not present

## 2021-04-20 NOTE — Progress Notes (Signed)
Office Visit Note  Patient: Victoria Herman             Date of Birth: 1961-09-06           MRN: [id number]             PCP: Glendale Chard, MD Referring: Glendale Chard, MD Visit Date: 05/01/2021 Occupation: @GUAROCC @  Subjective:  Raynaud's phenominon and joint pain.   History of Present Illness: Victoria Herman is a 60 y.o. female seen in consultation per request of Dr. Baird Cancer for evaluation of arthralgias and Raynauds.  According to the patient she has had history of Raynauds since she was in her 41s.  She states she lived in [place] for 2 years from 2000-2004 that when her symptoms were much more severe when she had to clear snow outside her house.  She moved to [facility] in 2004 and she still has discomfort during the winter months.  She has difficulty getting things out of the freezer.  He has noticed fingers turning white and blue.  Denies any history of digital ulcers.  He also has discoloration in her feet.  She has noticed some swelling in her hands which she describes over the MCPs.  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  She gives multiple shots a day.  She states towards the end of the day her hands is started hurting.  She also had tendinitis in the past.  In July 2021 she fell backwards and landed up on her left wrist.  She had open surgery on her left wrist followed by occupational and physical therapy.  She had good response to it.  She complains of occasional trochanteric bursa pain.  She has had off-and-on right hip joint pain which improved after weight loss.  None of the other joints are painful.  There is no history of oral ulcers, nasal ulcers, malar rash, photosensitivity, lymphadenopathy.  She gives history of dry mouth and occasional dry eyes.  There is no family history of autoimmune disease.  Her daughter has Raynauds.  She is gravida 1, para 1, miscarriages 0.  There is no history of DVT.  Activities of Daily Living:  Patient reports morning stiffness for 0  minutes.   Patient Reports nocturnal pain.  Difficulty dressing/grooming: Denies Difficulty climbing stairs: Denies Difficulty getting out of chair: Denies Difficulty using hands for taps, buttons, cutlery, and/or writing: Denies  Review of Systems  Constitutional:  Negative for fatigue.  HENT:  Positive for mouth dryness. Negative for mouth sores and nose dryness.   Eyes:  Negative for pain, itching and dryness.  Respiratory:  Negative for shortness of breath and difficulty breathing.   Cardiovascular:  Negative for chest pain and palpitations.  Gastrointestinal:  Negative for blood in stool, constipation and diarrhea.  Endocrine: Negative for increased urination.  Genitourinary:  Negative for difficulty urinating.  Musculoskeletal:  Positive for joint pain and joint pain. Negative for joint swelling, myalgias, morning stiffness, muscle tenderness and myalgias.  Skin:  Positive for color change. Negative for rash, redness and sensitivity to sunlight.  Allergic/Immunologic: Negative for susceptible to infections.  Neurological:  Negative for dizziness, numbness, headaches, memory loss and weakness.  Hematological:  Negative for bruising/bleeding tendency and swollen glands.  Psychiatric/Behavioral:  Negative for depressed mood, confusion and sleep disturbance. The patient is not nervous/anxious.    PMFS History:  Patient Active Problem List   Diagnosis Date Noted   Stress incontinence in female 04/10/2021   Abnormal serum  iron level 04/10/2021   Anxiety 04/10/2021   Class 2 obesity due to excess calories without serious comorbidity with body mass index (BMI) of 36.0 to 36.9 in adult 04/10/2021   Superficial thrombophlebitis 03/21/2014    Past Medical History:  Diagnosis Date   Allergy    Anxiety    GERD (gastroesophageal reflux disease)    Hyperlipidemia    Seasonal allergies    Superficial thrombophlebitis 03-2014, 07-2014    Family History  Problem Relation Age of Onset    Hypertension Mother    Diabetes Mother    Varicose Veins Mother    Varicose Veins Father    Cancer Father    Hypertension Sister    Diabetes Sister    Thyroid disease Sister    Varicose Veins Brother    Hypertension Brother    Hypertension Brother    Varicose Veins Brother    Healthy Daughter    Migraines Daughter    Colon cancer Neg Hx    Esophageal cancer Neg Hx    Rectal cancer Neg Hx    Stomach cancer Neg Hx    Breast cancer Neg Hx    Past Surgical History:  Procedure Laterality Date   COLONOSCOPY     ORIF WRIST FRACTURE Left 04/12/2020   Procedure: OPEN REDUCTION INTERNAL FIXATION (ORIF) WRIST FRACTURE and repair reconstruction as indicated;  Surgeon: Iran Planas, MD;  Location: [place];  Service: Orthopedics;  Laterality: Left;  with IV sedation  Needs 2 hours   WISDOM TOOTH EXTRACTION     Social History   Social History Narrative   Not on file   Immunization History  Administered Date(s) Administered   Influenza,inj,Quad PF,6+ Mos 07/16/2019, 07/28/2020   Influenza-Unspecified 07/17/2017   PFIZER(Purple Top)SARS-COV-2 Vaccination 09/30/2019, 10/20/2019, 07/21/2020   Zoster Recombinat (Shingrix) 05/12/2018, 07/20/2018     Objective: Vital Signs: BP 117/81 (BP Location: Right Arm, Patient Position: Sitting, Cuff Size: Normal)   Pulse 75   Ht 5' 4.5" (1.638 m)   Wt 206 lb 3.2 oz (93.5 kg)   LMP 07/23/2013   BMI 34.85 kg/m    Physical Exam Vitals and nursing note reviewed.  Constitutional:      Appearance: She is well-developed.  HENT:     Head: Normocephalic and atraumatic.  Eyes:     Conjunctiva/sclera: Conjunctivae normal.  Cardiovascular:     Rate and Rhythm: Normal rate and regular rhythm.     Heart sounds: Normal heart sounds.     Comments: Varicose veins were noted on bilateral lower extremities. Pulmonary:     Effort: Pulmonary effort is normal.     Breath sounds: Normal breath sounds.  Abdominal:     General: Bowel sounds are normal.      Palpations: Abdomen is soft.  Musculoskeletal:     Cervical back: Normal range of motion.  Lymphadenopathy:     Cervical: No cervical adenopathy.  Skin:    General: Skin is warm and dry.     Capillary Refill: Capillary refill takes 2 to 3 seconds.     Comments: Digital cyanosis was noted on her bilateral feet.  Hyperemia was noted on her hands.  She had decreased capillary refill.  No nailbed capillary changes were noted.  No sclerodactyly or telangiectasias were noted.  Neurological:     Mental Status: She is alert and oriented to person, place, and time.  Psychiatric:        Behavior: Behavior normal.     Musculoskeletal Exam: C-spine was in good  range of motion.  Shoulder joints, elbow joints, wrist joints with good range of motion.  She is surgical scar on her left wrist from previous surgery.  She had no synovitis of her MCPs, PIPs or DIPs.  Hip joints and knee joints with good range of motion.  She no tenderness over ankles or MTPs.  CDAI Exam: CDAI Score: -- Patient Global: --; Provider Global: -- Swollen: --; Tender: -- Joint Exam 05/01/2021   No joint exam has been documented for this visit   There is currently no information documented on the homunculus. Go to the Rheumatology activity and complete the homunculus joint exam.  Investigation: No additional findings.  Imaging: No results found.  Recent Labs: Lab Results  Component Value Date   WBC 7.8 04/16/2021   HGB 13.5 04/16/2021   PLT 212 04/16/2021   NA 137 04/16/2021   K 3.9 04/16/2021   CL 102 04/16/2021   CO2 26 (A) 04/16/2021   GLUCOSE 95 04/03/2020   BUN 13 04/16/2021   CREATININE 0.7 04/16/2021   BILITOT 0.3 04/03/2020   ALKPHOS 87 04/16/2021   AST 17 04/16/2021   ALT 13 04/16/2021   PROT 6.5 04/03/2020   ALBUMIN 4.3 04/16/2021   CALCIUM 9.0 04/16/2021   GFRAA 109 04/16/2021    Speciality Comments: No specialty comments available.  Procedures:  No procedures performed Allergies: E-mycin  [erythromycin] and Sulfa antibiotics   Assessment / Plan:     Visit Diagnoses: Raynaud's phenomenon without gangrene -she gives history of severe Raynauds since she was in her 16s.  She states symptoms are worse when she is exposed to the colder temperatures.  Decreased capillary refill was noted in her hands and her feet.  There is no history of digital ulcers.  Detailed counseling regarding Raynauds phenomenon was provided.  Keeping core temperature warm and warm clothing was discussed.  I also discussed the option of adding amlodipine but patient decided to wait until the winter months.  No nailbed capillary changes, sclerodactyly or telangiectasias were noted.  I will obtain additional labs today to complete the autoimmune work-up.  02/26/21: ANA negative, lupus anticoagulant-not detected, antithrombin activity 103.  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 - Plan: CK, Sedimentation rate, Rheumatoid factor, Cyclic citrul peptide antibody, IgG, Anti-scleroderma antibody, RNP Antibody, Anti-Smith antibody, Sjogrens syndrome-A extractable nuclear antibody, Sjogrens syndrome-B extractable nuclear antibody, Anti-DNA antibody, double-stranded, C3 and C4, Beta-2 glycoprotein antibodies, Cardiolipin antibodies, IgG, IgM, IgA, Cryoglobulin, Pan-ANCA  Pain in both hands -she complains of pain and discomfort in her bilateral hands especially over MCP joints.  No synovitis was noted.  Plan: XR Hand 2 View Right, XR Hand 2 View Left.  X-rays of bilateral hands were consistent with osteoarthritis.  Hardware was noted in the right wrist joint from previous surgery.  Status post ORIF left wrist July 2021.  She had good response to physical therapy and Occupational Therapy.  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  She has been on calcium rich diet and vitamin D.  History of anxiety  Stress incontinence in female  BMI 34.0-34.9,adult  Orders: Orders Placed This Encounter   Procedures   XR Hand 2 View Right   XR Hand 2 View Left   CK   Sedimentation rate   Rheumatoid factor   Cyclic citrul peptide antibody, IgG   Anti-scleroderma antibody   RNP Antibody   Anti-Smith antibody   Sjogrens syndrome-A extractable nuclear antibody   Sjogrens syndrome-B  extractable nuclear antibody   Anti-DNA antibody, double-stranded   C3 and C4   Beta-2 glycoprotein antibodies   Cardiolipin antibodies, IgG, IgM, IgA   Cryoglobulin   Pan-ANCA    No orders of the defined types were placed in this encounter.    Follow-Up Instructions: Return for Positive ANA, Raynauds.   Bo Merino, MD  Note - This record has been created using Editor, commissioning.  Chart creation errors have been sought, but may not always  have been located. Such creation errors do not reflect on  the standard of medical care.

## 2021-04-24 DIAGNOSIS — S52572A Other intraarticular fracture of lower end of left radius, initial encounter for closed fracture: Secondary | ICD-10-CM | POA: Diagnosis not present

## 2021-04-26 ENCOUNTER — Encounter: Payer: Self-pay | Admitting: Internal Medicine

## 2021-04-26 DIAGNOSIS — K219 Gastro-esophageal reflux disease without esophagitis: Secondary | ICD-10-CM | POA: Diagnosis not present

## 2021-04-26 DIAGNOSIS — Z6836 Body mass index (BMI) 36.0-36.9, adult: Secondary | ICD-10-CM | POA: Diagnosis not present

## 2021-04-30 ENCOUNTER — Other Ambulatory Visit (HOSPITAL_COMMUNITY): Payer: Self-pay

## 2021-05-01 ENCOUNTER — Encounter: Payer: Self-pay | Admitting: Rheumatology

## 2021-05-01 ENCOUNTER — Ambulatory Visit (INDEPENDENT_AMBULATORY_CARE_PROVIDER_SITE_OTHER): Payer: 59 | Admitting: Rheumatology

## 2021-05-01 ENCOUNTER — Ambulatory Visit: Payer: Self-pay

## 2021-05-01 ENCOUNTER — Other Ambulatory Visit: Payer: Self-pay

## 2021-05-01 VITALS — BP 117/81 | HR 75 | Ht 64.5 in | Wt 206.2 lb

## 2021-05-01 DIAGNOSIS — Z8781 Personal history of (healed) traumatic fracture: Secondary | ICD-10-CM | POA: Diagnosis not present

## 2021-05-01 DIAGNOSIS — Z8659 Personal history of other mental and behavioral disorders: Secondary | ICD-10-CM

## 2021-05-01 DIAGNOSIS — E6609 Other obesity due to excess calories: Secondary | ICD-10-CM

## 2021-05-01 DIAGNOSIS — Z9889 Other specified postprocedural states: Secondary | ICD-10-CM

## 2021-05-01 DIAGNOSIS — M8589 Other specified disorders of bone density and structure, multiple sites: Secondary | ICD-10-CM | POA: Diagnosis not present

## 2021-05-01 DIAGNOSIS — Z6834 Body mass index (BMI) 34.0-34.9, adult: Secondary | ICD-10-CM | POA: Diagnosis not present

## 2021-05-01 DIAGNOSIS — M79642 Pain in left hand: Secondary | ICD-10-CM | POA: Diagnosis not present

## 2021-05-01 DIAGNOSIS — N393 Stress incontinence (female) (male): Secondary | ICD-10-CM

## 2021-05-01 DIAGNOSIS — R790 Abnormal level of blood mineral: Secondary | ICD-10-CM

## 2021-05-01 DIAGNOSIS — I7300 Raynaud's syndrome without gangrene: Secondary | ICD-10-CM

## 2021-05-01 DIAGNOSIS — M79641 Pain in right hand: Secondary | ICD-10-CM

## 2021-05-01 NOTE — Patient Instructions (Signed)
Hand Exercises Hand exercises can be helpful for almost anyone. These exercises can strengthen the hands, improve flexibility and movement, and increase blood flow to the hands. These results can make work and daily tasks easier. Hand exercises can be especially helpful for people who have joint pain from arthritis or have nerve damage from overuse (carpal tunnel syndrome). These exercises can also help people who have injured a hand. Exercises Most of these hand exercises are gentle stretching and motion exercises. It is usually safe to do them often throughout the day. Warming up your hands before exercise may help to reduce stiffness. You can do this with gentle massage orby placing your hands in warm water for 10-15 minutes. It is normal to feel some stretching, pulling, tightness, or mild discomfort as you begin new exercises. This will gradually improve. Stop an exercise right away if you feel sudden, severe pain or your pain gets worse. Ask your healthcare provider which exercises are best for you. Knuckle bend or "claw" fist Stand or sit with your arm, hand, and all five fingers pointed straight up. Make sure to keep your wrist straight during the exercise. Gently bend your fingers down toward your palm until the tips of your fingers are touching the top of your palm. Keep your big knuckle straight and just bend the small knuckles in your fingers. Hold this position for __________ seconds. Straighten (extend) your fingers back to the starting position. Repeat this exercise 5-10 times with each hand. Full finger fist Stand or sit with your arm, hand, and all five fingers pointed straight up. Make sure to keep your wrist straight during the exercise. Gently bend your fingers into your palm until the tips of your fingers are touching the middle of your palm. Hold this position for __________ seconds. Extend your fingers back to the starting position, stretching every joint fully. Repeat this  exercise 5-10 times with each hand. Straight fist Stand or sit with your arm, hand, and all five fingers pointed straight up. Make sure to keep your wrist straight during the exercise. Gently bend your fingers at the big knuckle, where your fingers meet your hand, and the middle knuckle. Keep the knuckle at the tips of your fingers straight and try to touch the bottom of your palm. Hold this position for __________ seconds. Extend your fingers back to the starting position, stretching every joint fully. Repeat this exercise 5-10 times with each hand. Tabletop Stand or sit with your arm, hand, and all five fingers pointed straight up. Make sure to keep your wrist straight during the exercise. Gently bend your fingers at the big knuckle, where your fingers meet your hand, as far down as you can while keeping the small knuckles in your fingers straight. Think of forming a tabletop with your fingers. Hold this position for __________ seconds. Extend your fingers back to the starting position, stretching every joint fully. Repeat this exercise 5-10 times with each hand. Finger spread Place your hand flat on a table with your palm facing down. Make sure your wrist stays straight as you do this exercise. Spread your fingers and thumb apart from each other as far as you can until you feel a gentle stretch. Hold this position for __________ seconds. Bring your fingers and thumb tight together again. Hold this position for __________ seconds. Repeat this exercise 5-10 times with each hand. Making circles Stand or sit with your arm, hand, and all five fingers pointed straight up. Make sure to keep your   wrist straight during the exercise. Make a circle by touching the tip of your thumb to the tip of your index finger. Hold for __________ seconds. Then open your hand wide. Repeat this motion with your thumb and each finger on your hand. Repeat this exercise 5-10 times with each hand. Thumb motion Sit  with your forearm resting on a table and your wrist straight. Your thumb should be facing up toward the ceiling. Keep your fingers relaxed as you move your thumb. Lift your thumb up as high as you can toward the ceiling. Hold for __________ seconds. Bend your thumb across your palm as far as you can, reaching the tip of your thumb for the small finger (pinkie) side of your palm. Hold for __________ seconds. Repeat this exercise 5-10 times with each hand. Grip strengthening  Hold a stress ball or other soft ball in the middle of your hand. Slowly increase the pressure, squeezing the ball as much as you can without causing pain. Think of bringing the tips of your fingers into the middle of your palm. All of your finger joints should bend when doing this exercise. Hold your squeeze for __________ seconds, then relax. Repeat this exercise 5-10 times with each hand. Contact a health care provider if: Your hand pain or discomfort gets much worse when you do an exercise. Your hand pain or discomfort does not improve within 2 hours after you exercise. If you have any of these problems, stop doing these exercises right away. Do not do them again unless your health care provider says that you can. Get help right away if: You develop sudden, severe hand pain or swelling. If this happens, stop doing these exercises right away. Do not do them again unless your health care provider says that you can. This information is not intended to replace advice given to you by your health care provider. Make sure you discuss any questions you have with your healthcare provider. Document Revised: 01/14/2019 Document Reviewed: 09/24/2018 Elsevier Patient Education  2022 Elsevier Inc.  

## 2021-05-03 DIAGNOSIS — K219 Gastro-esophageal reflux disease without esophagitis: Secondary | ICD-10-CM | POA: Diagnosis not present

## 2021-05-03 DIAGNOSIS — Z6835 Body mass index (BMI) 35.0-35.9, adult: Secondary | ICD-10-CM | POA: Diagnosis not present

## 2021-05-08 LAB — ANTI-DNA ANTIBODY, DOUBLE-STRANDED: ds DNA Ab: <1 IU/mL

## 2021-05-08 LAB — ANTI-SCLERODERMA ANTIBODY: Scleroderma (Scl-70) (ENA) Antibody, IgG: <1 AI

## 2021-05-08 LAB — PAN-ANCA
ANCA Screen: NEGATIVE
Myeloperoxidase Abs: <1 AI
Serine Protease 3: <1 AI

## 2021-05-08 LAB — SJOGRENS SYNDROME-B EXTRACTABLE NUCLEAR ANTIBODY: SSB (La) (ENA) Antibody, IgG: <1 AI

## 2021-05-08 LAB — C3 AND C4
C3 Complement: 153 mg/dL (ref 83–193)
C4 Complement: 28 mg/dL (ref 15–57)

## 2021-05-08 LAB — RHEUMATOID FACTOR: Rheumatoid fact SerPl-aCnc: <14 IU/mL (ref <14)

## 2021-05-08 LAB — RNP ANTIBODY: Ribonucleic Protein(ENA) Antibody, IgG: <1 AI

## 2021-05-08 LAB — BETA-2 GLYCOPROTEIN ANTIBODIES
Beta-2 Glyco 1 IgA: <2 U/mL
Beta-2 Glyco 1 IgM: 2 U/mL
Beta-2 Glyco I IgG: 2.8 U/mL

## 2021-05-08 LAB — CARDIOLIPIN ANTIBODIES, IGG, IGM, IGA
Anticardiolipin IgA: <2 APL-U/mL
Anticardiolipin IgG: 3.7 GPL-U/mL
Anticardiolipin IgM: <2 MPL-U/mL

## 2021-05-08 LAB — SJOGRENS SYNDROME-A EXTRACTABLE NUCLEAR ANTIBODY: SSA (Ro) (ENA) Antibody, IgG: <1 AI

## 2021-05-08 LAB — ANTI-SMITH ANTIBODY: ENA SM Ab Ser-aCnc: <1 AI

## 2021-05-08 LAB — CK: Total CK: 56 U/L (ref 29–143)

## 2021-05-08 LAB — CYCLIC CITRUL PEPTIDE ANTIBODY, IGG: Cyclic Citrullin Peptide Ab: <16 UNITS

## 2021-05-08 LAB — SEDIMENTATION RATE: Sed Rate: 6 mm/h (ref 0–30)

## 2021-05-08 LAB — CRYOGLOBULIN: Cryoglobulin, Qualitative Analysis: NOT DETECTED

## 2021-05-08 NOTE — Progress Notes (Signed)
I will discuss results at the follow-up visit.

## 2021-05-10 DIAGNOSIS — E611 Iron deficiency: Secondary | ICD-10-CM | POA: Diagnosis not present

## 2021-05-10 DIAGNOSIS — Z6835 Body mass index (BMI) 35.0-35.9, adult: Secondary | ICD-10-CM | POA: Diagnosis not present

## 2021-05-11 ENCOUNTER — Other Ambulatory Visit (HOSPITAL_COMMUNITY): Payer: Self-pay

## 2021-05-15 NOTE — Progress Notes (Signed)
Office Visit Note  Patient: Victoria Herman             Date of Birth: [birth year]           MRN: [id number]             PCP: Glendale Chard, MD Referring: Glendale Chard, MD Visit Date: 05/29/2021 Occupation: @GUAROCC @  Subjective:  Other (Patient reports coccyx pain after sitting in a recliner for approximately one hour)   History of Present Illness: MALEIAH DULA is a 60 y.o. female with history of Raynauds and osteoarthritis.  She states she continues to have rib Raynauds symptoms.  Her symptoms are usually worse during the winter months.  She notices some puffiness in her hands.  She also has some discomfort in her hands due to underlying osteoarthritis.  She states she went to a movie theater and was in a recliner for a while.  Since then she has been having some coccygeal pain.  None of the other joints are painful.  Activities of Daily Living:  Patient reports morning stiffness for 0 minutes.   Patient Reports nocturnal pain.  Difficulty dressing/grooming: Denies Difficulty climbing stairs: Denies Difficulty getting out of chair: Denies Difficulty using hands for taps, buttons, cutlery, and/or writing: Denies  Review of Systems  Constitutional:  Negative for fatigue.  HENT:  Negative for mouth sores, mouth dryness and nose dryness.   Eyes:  Negative for pain, itching and dryness.  Respiratory:  Negative for shortness of breath and difficulty breathing.   Cardiovascular:  Negative for chest pain and palpitations.  Gastrointestinal:  Negative for blood in stool, constipation and diarrhea.  Endocrine: Negative for increased urination.  Genitourinary:  Negative for difficulty urinating.  Musculoskeletal:  Positive for joint pain, joint pain, myalgias and myalgias. Negative for joint swelling, morning stiffness and muscle tenderness.  Skin:  Negative for color change, rash and redness.  Allergic/Immunologic: Negative for susceptible to infections.  Neurological:  Negative for  dizziness, numbness, headaches, memory loss and weakness.  Hematological:  Negative for bruising/bleeding tendency.  Psychiatric/Behavioral:  Negative for confusion.    PMFS History:  Patient Active Problem List   Diagnosis Date Noted   Stress incontinence in female 04/10/2021   Abnormal serum iron level 04/10/2021   Anxiety 04/10/2021   Class 2 obesity due to excess calories without serious comorbidity with body mass index (BMI) of 36.0 to 36.9 in adult 04/10/2021   Superficial thrombophlebitis 03/21/2014    Past Medical History:  Diagnosis Date   Allergy    Anxiety    GERD (gastroesophageal reflux disease)    Hyperlipidemia    Seasonal allergies    Superficial thrombophlebitis 03-2014, 07-2014    Family History  Problem Relation Age of Onset   Hypertension Mother    Diabetes Mother    Varicose Veins Mother    Varicose Veins Father    Cancer Father    Hypertension Sister    Diabetes Sister    Thyroid disease Sister    Varicose Veins Brother    Hypertension Brother    Hypertension Brother    Varicose Veins Brother    Healthy Daughter    Migraines Daughter    Colon cancer Neg Hx    Esophageal cancer Neg Hx    Rectal cancer Neg Hx    Stomach cancer Neg Hx    Breast cancer Neg Hx    Past Surgical History:  Procedure Laterality Date   COLONOSCOPY     ORIF WRIST FRACTURE  Left 04/12/2020   Procedure: OPEN REDUCTION INTERNAL FIXATION (ORIF) WRIST FRACTURE and repair reconstruction as indicated;  Surgeon: Iran Planas, MD;  Location: [facility];  Service: Orthopedics;  Laterality: Left;  with IV sedation  Needs 2 hours   WISDOM TOOTH EXTRACTION     Social History   Social History Narrative   Not on file   Immunization History  Administered Date(s) Administered   Influenza,inj,Quad PF,6+ Mos 07/16/2019, 07/28/2020   Influenza-Unspecified 07/17/2017   PFIZER(Purple Top)SARS-COV-2 Vaccination 09/30/2019, 10/20/2019, 07/21/2020   Zoster Recombinat (Shingrix) 05/12/2018,  07/20/2018     Objective: Vital Signs: BP 114/75 (BP Location: Left Arm, Patient Position: Sitting, Cuff Size: Normal)   Pulse 80   Ht 5' 3.5" (1.613 m)   Wt 204 lb 3.2 oz (92.6 kg)   LMP 07/23/2013   BMI 35.61 kg/m    Physical Exam Vitals and nursing note reviewed.  Constitutional:      Appearance: She is well-developed.  HENT:     Head: Normocephalic and atraumatic.  Eyes:     Conjunctiva/sclera: Conjunctivae normal.  Cardiovascular:     Rate and Rhythm: Normal rate and regular rhythm.     Heart sounds: Normal heart sounds.  Pulmonary:     Effort: Pulmonary effort is normal.     Breath sounds: Normal breath sounds.  Abdominal:     General: Bowel sounds are normal.     Palpations: Abdomen is soft.  Musculoskeletal:     Cervical back: Normal range of motion.  Lymphadenopathy:     Cervical: No cervical adenopathy.  Skin:    General: Skin is warm and dry.     Capillary Refill: Capillary refill takes less than 2 seconds.     Comments: Hands are warm to touch with good capillary refill.  There is no sclerodactyly.  No nailbed capillary changes were noted.  Hyperemia of bilateral hands was noted.  No digital ulcers were noted.  Neurological:     Mental Status: She is alert and oriented to person, place, and time.  Psychiatric:        Behavior: Behavior normal.     Musculoskeletal Exam: C-spine thoracic and lumbar spine were in good range of motion.  Shoulder joints, elbow joints, wrist joints, MCPs PIPs and DIPs with good range of motion with no synovitis.  Hip joints, knee joints, ankles, MTPs and PIPs with good range of motion with no synovitis.  CDAI Exam: CDAI Score: -- Patient Global: --; Provider Global: -- Swollen: --; Tender: -- Joint Exam 05/29/2021   No joint exam has been documented for this visit   There is currently no information documented on the homunculus. Go to the Rheumatology activity and complete the homunculus joint exam.  Investigation: No  additional findings.  Imaging: XR Hand 2 View Left  Result Date: 05/01/2021 CMC, PIP and DIP narrowing was noted.  No MCP, intercarpal or radiocarpal joint space was noted.  No erosive changes were noted.  Hardware was noted in the left wrist from previous surgery. Impression: These findings are consistent with osteoarthritis of the hand.  Postsurgical changes were noted.  XR Hand 2 View Right  Result Date: 05/01/2021 CMC, PIP and DIP narrowing was noted.  No MCP, intercarpal or radiocarpal joint space narrowing was noted.  No erosive changes were noted. Impression: These findings are consistent with osteoarthritis of the hand.   Recent Labs: Lab Results  Component Value Date   WBC 7.8 04/16/2021   HGB 13.5 04/16/2021   PLT 212  04/16/2021   NA 137 04/16/2021   K 3.9 04/16/2021   CL 102 04/16/2021   CO2 26 (A) 04/16/2021   GLUCOSE 95 04/03/2020   BUN 13 04/16/2021   CREATININE 0.7 04/16/2021   BILITOT 0.3 04/03/2020   ALKPHOS 87 04/16/2021   AST 17 04/16/2021   ALT 13 04/16/2021   PROT 6.5 04/03/2020   ALBUMIN 4.3 04/16/2021   CALCIUM 9.0 04/16/2021   GFRAA 109 04/16/2021   May 01, 2001 ANA negative, C3-C4 normal, anticardiolipin negative, beta-2 GP 1 negative, ANCA negative, MPO negative, serine protease 3 negative, CK 56, sed rate 6, RF negative, anti-CCP negative, cryoglobulins negative  02/26/21: ANA negative, lupus anticoagulant-not detected, antithrombin activity 103.  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  May 01, 2021 ESR 6, CK 56, RF negative, anti-CCP negative, ANCA negative, MPO negative, serine proteinase 3 negative, ANA negative, C3-C4 normal, anticardiolipin negative, beta-2 GP 1 negative, cryoglobulins negative  Speciality Comments: No specialty comments available.  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  No sclerodactyly, telangiectasias or nailbed capillary changes were noted.  Erythema noticed on bilateral hands.  Hands were warm to touch today.  All autoimmune work-up negative.  I advised her to contact me in case she develops any new symptoms.  I also discussed use of amlodipine during the winter months.  Left findings with the last visit were discussed.  Primary osteoarthritis of both hands - Clinical and radiographic findings are consistent with osteoarthritis.  Joint protection muscle strengthening was discussed.  A handout on exercises was given at the last visit.  She will continue with the exercises and joint protection.  S/P ORIF (open reduction internal fixation) fracture - Left wrist July 2021.  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  Use of coccyx cushion and avoiding pressure in the coccygeal region was discussed.  I also discussed the option of physical therapy if she has persistent symptoms.  She will contact me if her symptoms persist.  Osteopenia of multiple sites-use of calcium rich diet with vitamin D and exercise was emphasized.  History of anxiety  Stress incontinence in female  BMI 34.0-34.9,adult  Orders: No orders of the defined types were placed in this encounter.  No orders of the defined types were placed in this encounter.   Follow-Up Instructions: Return in about 5 months (around 10/29/2021) for Raynaud's, OA.   Bo Merino, MD  Note - This record has been created using Editor, commissioning.  Chart creation errors have been sought, but may not always  have been located. Such creation errors do not reflect on  the standard of medical care.

## 2021-05-17 DIAGNOSIS — Z6835 Body mass index (BMI) 35.0-35.9, adult: Secondary | ICD-10-CM | POA: Diagnosis not present

## 2021-05-17 DIAGNOSIS — K219 Gastro-esophageal reflux disease without esophagitis: Secondary | ICD-10-CM | POA: Diagnosis not present

## 2021-05-17 DIAGNOSIS — N951 Menopausal and female climacteric states: Secondary | ICD-10-CM | POA: Diagnosis not present

## 2021-05-21 ENCOUNTER — Other Ambulatory Visit (HOSPITAL_BASED_OUTPATIENT_CLINIC_OR_DEPARTMENT_OTHER): Payer: Self-pay | Admitting: Internal Medicine

## 2021-05-21 DIAGNOSIS — Z1231 Encounter for screening mammogram for malignant neoplasm of breast: Secondary | ICD-10-CM

## 2021-05-24 DIAGNOSIS — N951 Menopausal and female climacteric states: Secondary | ICD-10-CM | POA: Diagnosis not present

## 2021-05-24 DIAGNOSIS — F419 Anxiety disorder, unspecified: Secondary | ICD-10-CM | POA: Diagnosis not present

## 2021-05-24 DIAGNOSIS — Z6835 Body mass index (BMI) 35.0-35.9, adult: Secondary | ICD-10-CM | POA: Diagnosis not present

## 2021-05-24 DIAGNOSIS — M2550 Pain in unspecified joint: Secondary | ICD-10-CM | POA: Diagnosis not present

## 2021-05-29 ENCOUNTER — Ambulatory Visit (INDEPENDENT_AMBULATORY_CARE_PROVIDER_SITE_OTHER): Payer: 59 | Admitting: Rheumatology

## 2021-05-29 ENCOUNTER — Encounter: Payer: Self-pay | Admitting: Rheumatology

## 2021-05-29 ENCOUNTER — Other Ambulatory Visit: Payer: Self-pay

## 2021-05-29 VITALS — BP 114/75 | HR 80 | Ht 63.5 in | Wt 204.2 lb

## 2021-05-29 DIAGNOSIS — Z8659 Personal history of other mental and behavioral disorders: Secondary | ICD-10-CM

## 2021-05-29 DIAGNOSIS — N393 Stress incontinence (female) (male): Secondary | ICD-10-CM | POA: Diagnosis not present

## 2021-05-29 DIAGNOSIS — I7300 Raynaud's syndrome without gangrene: Secondary | ICD-10-CM | POA: Diagnosis not present

## 2021-05-29 DIAGNOSIS — Z9889 Other specified postprocedural states: Secondary | ICD-10-CM

## 2021-05-29 DIAGNOSIS — M8589 Other specified disorders of bone density and structure, multiple sites: Secondary | ICD-10-CM

## 2021-05-29 DIAGNOSIS — Z6834 Body mass index (BMI) 34.0-34.9, adult: Secondary | ICD-10-CM | POA: Diagnosis not present

## 2021-05-29 DIAGNOSIS — M19042 Primary osteoarthritis, left hand: Secondary | ICD-10-CM | POA: Diagnosis not present

## 2021-05-29 DIAGNOSIS — M19041 Primary osteoarthritis, right hand: Secondary | ICD-10-CM | POA: Diagnosis not present

## 2021-05-29 DIAGNOSIS — Z8781 Personal history of (healed) traumatic fracture: Secondary | ICD-10-CM

## 2021-05-29 DIAGNOSIS — M533 Sacrococcygeal disorders, not elsewhere classified: Secondary | ICD-10-CM

## 2021-05-31 ENCOUNTER — Encounter: Payer: Self-pay | Admitting: Internal Medicine

## 2021-05-31 DIAGNOSIS — E611 Iron deficiency: Secondary | ICD-10-CM | POA: Diagnosis not present

## 2021-05-31 DIAGNOSIS — Z6834 Body mass index (BMI) 34.0-34.9, adult: Secondary | ICD-10-CM | POA: Diagnosis not present

## 2021-06-12 ENCOUNTER — Encounter (HOSPITAL_BASED_OUTPATIENT_CLINIC_OR_DEPARTMENT_OTHER): Payer: Self-pay

## 2021-06-12 ENCOUNTER — Ambulatory Visit (HOSPITAL_BASED_OUTPATIENT_CLINIC_OR_DEPARTMENT_OTHER)
Admission: RE | Admit: 2021-06-12 | Discharge: 2021-06-12 | Disposition: A | Discharge status: Home / Self Care | Payer: 59 | Source: Ambulatory Visit | Attending: Internal Medicine | Admitting: Internal Medicine

## 2021-06-12 ENCOUNTER — Other Ambulatory Visit: Payer: Self-pay

## 2021-06-12 DIAGNOSIS — Z1231 Encounter for screening mammogram for malignant neoplasm of breast: Secondary | ICD-10-CM | POA: Diagnosis not present

## 2021-06-14 DIAGNOSIS — K219 Gastro-esophageal reflux disease without esophagitis: Secondary | ICD-10-CM | POA: Diagnosis not present

## 2021-06-14 DIAGNOSIS — Z6834 Body mass index (BMI) 34.0-34.9, adult: Secondary | ICD-10-CM | POA: Diagnosis not present

## 2021-06-18 DIAGNOSIS — D225 Melanocytic nevi of trunk: Secondary | ICD-10-CM | POA: Diagnosis not present

## 2021-06-18 DIAGNOSIS — D2261 Melanocytic nevi of right upper limb, including shoulder: Secondary | ICD-10-CM | POA: Diagnosis not present

## 2021-06-18 DIAGNOSIS — I8391 Asymptomatic varicose veins of right lower extremity: Secondary | ICD-10-CM | POA: Diagnosis not present

## 2021-06-18 DIAGNOSIS — I8392 Asymptomatic varicose veins of left lower extremity: Secondary | ICD-10-CM | POA: Diagnosis not present

## 2021-06-18 DIAGNOSIS — L821 Other seborrheic keratosis: Secondary | ICD-10-CM | POA: Diagnosis not present

## 2021-06-21 DIAGNOSIS — E785 Hyperlipidemia, unspecified: Secondary | ICD-10-CM | POA: Diagnosis not present

## 2021-06-21 DIAGNOSIS — Z6833 Body mass index (BMI) 33.0-33.9, adult: Secondary | ICD-10-CM | POA: Diagnosis not present

## 2021-06-28 DIAGNOSIS — Z6833 Body mass index (BMI) 33.0-33.9, adult: Secondary | ICD-10-CM | POA: Diagnosis not present

## 2021-06-28 DIAGNOSIS — K219 Gastro-esophageal reflux disease without esophagitis: Secondary | ICD-10-CM | POA: Diagnosis not present

## 2021-07-05 DIAGNOSIS — F419 Anxiety disorder, unspecified: Secondary | ICD-10-CM | POA: Diagnosis not present

## 2021-07-05 DIAGNOSIS — Z6833 Body mass index (BMI) 33.0-33.9, adult: Secondary | ICD-10-CM | POA: Diagnosis not present

## 2021-07-05 DIAGNOSIS — N951 Menopausal and female climacteric states: Secondary | ICD-10-CM | POA: Diagnosis not present

## 2021-07-05 DIAGNOSIS — G479 Sleep disorder, unspecified: Secondary | ICD-10-CM | POA: Diagnosis not present

## 2021-07-12 DIAGNOSIS — M2550 Pain in unspecified joint: Secondary | ICD-10-CM | POA: Diagnosis not present

## 2021-07-12 DIAGNOSIS — Z6833 Body mass index (BMI) 33.0-33.9, adult: Secondary | ICD-10-CM | POA: Diagnosis not present

## 2021-07-18 ENCOUNTER — Other Ambulatory Visit (HOSPITAL_COMMUNITY): Payer: Self-pay

## 2021-07-20 IMAGING — CR DG WRIST COMPLETE 3+V*L*
4 series · 4 of 4 positions shown · non-contrast
Comparison: None.
COMPARISON: None.

Addendum:
CLINICAL DATA: Status post trauma.

EXAM:
LEFT WRIST - COMPLETE 3+ VIEW

[x wrist pa left]
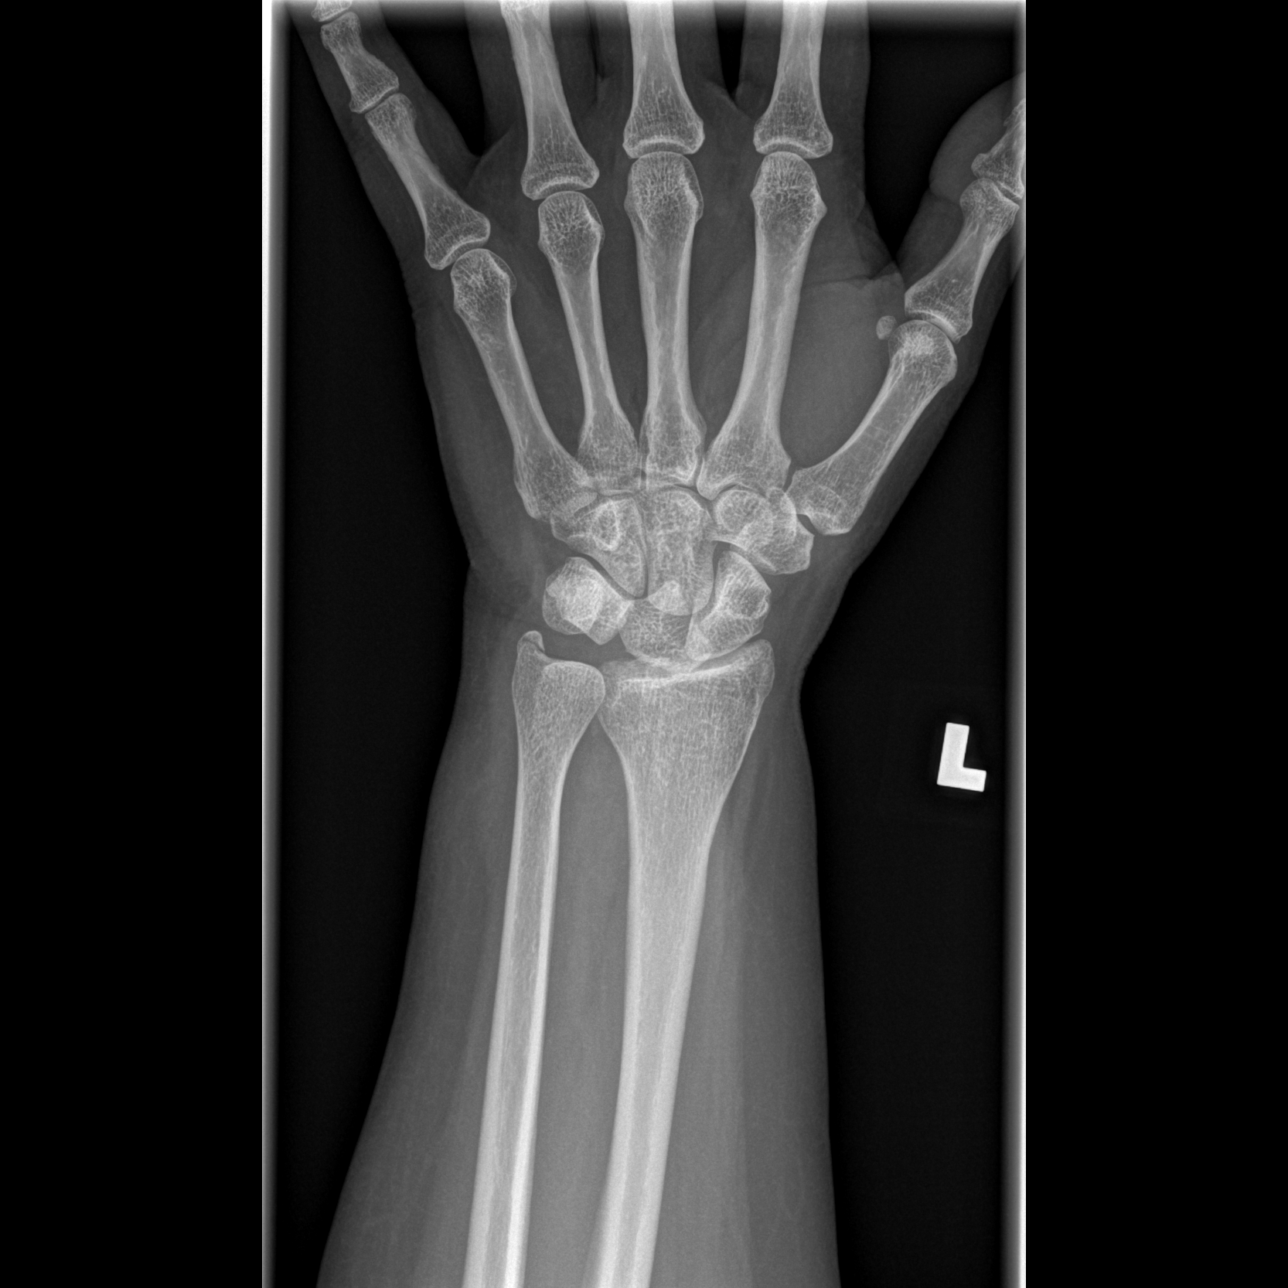

[x wrist obl left]
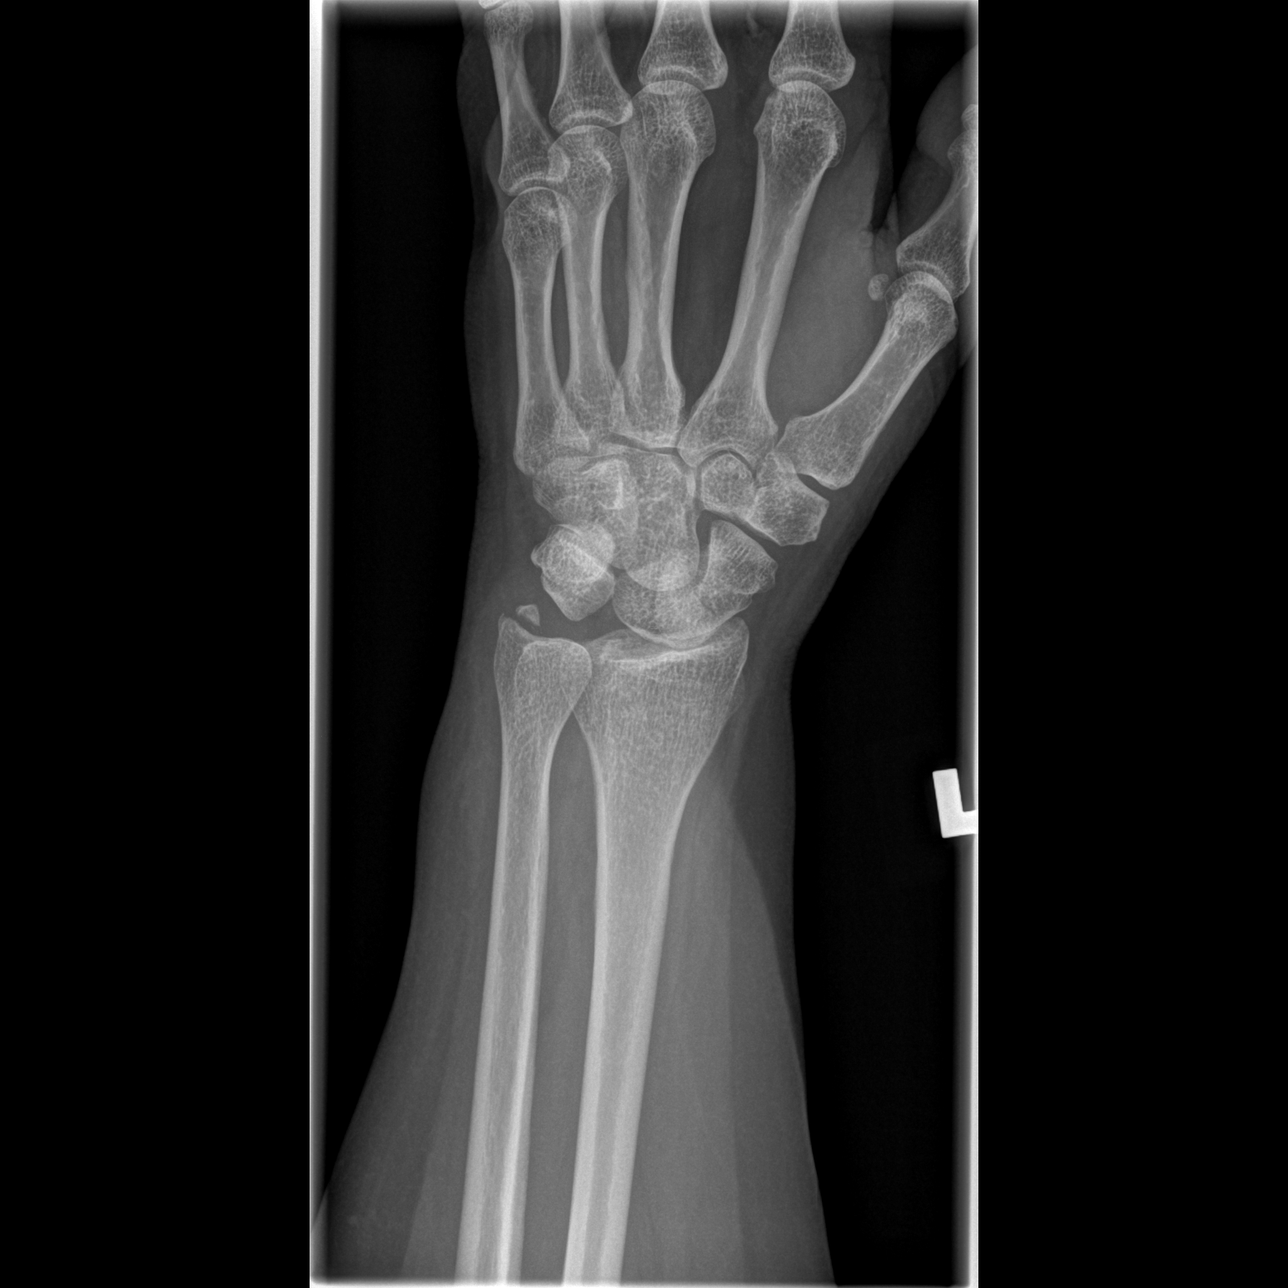

[x wrist lat left]
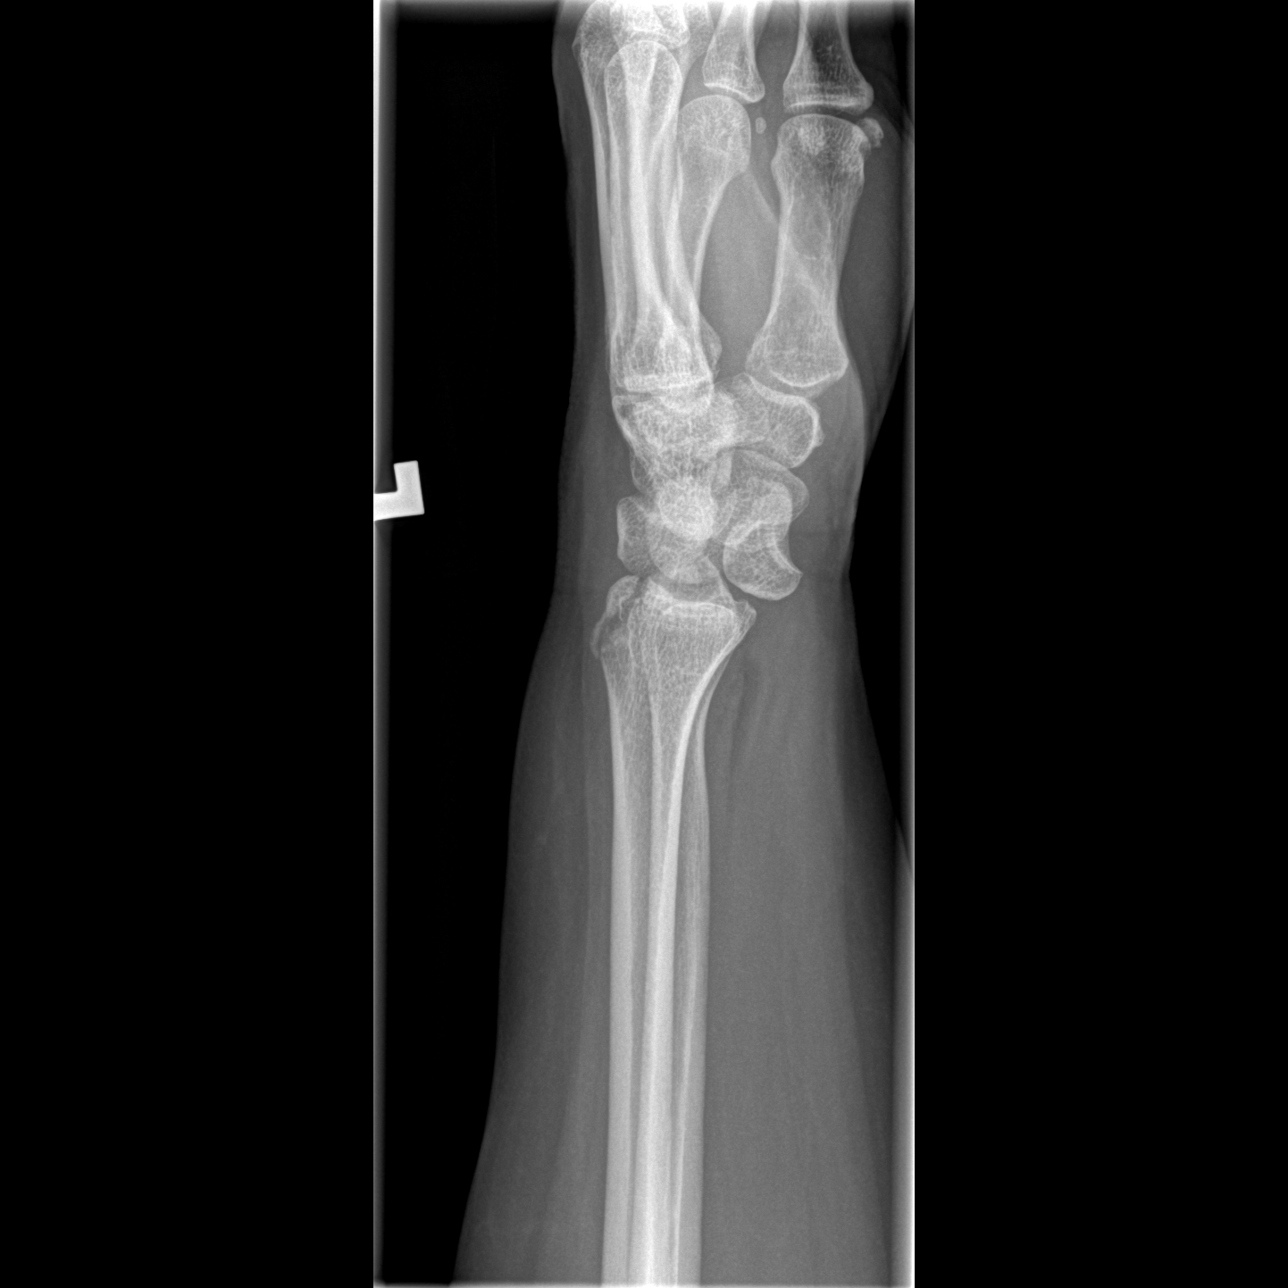

[x navicular]
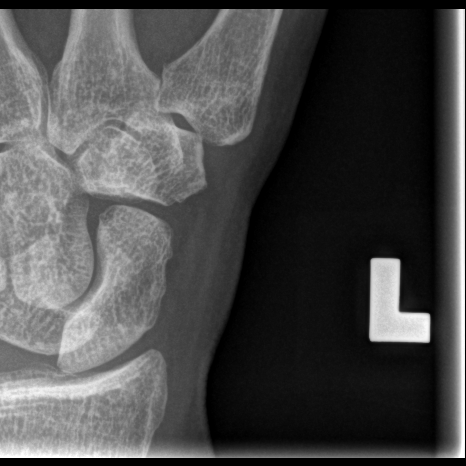

[4 of 4 positions shown; findings below may reference images not displayed]

FINDINGS: An acute nondisplaced fracture is seen involving the left ulnar
styloid. An additional fracture is seen along the dorsal aspect of
the distal left radius. There is no evidence of dislocation. There
is no evidence of arthropathy or other focal bone abnormality. Mild
soft tissue swelling is seen adjacent to the previously noted
fracture sites.
IMPRESSION: Acute fractures of the distal left radius and ulnar styloid.

ADDENDUM:
Upon further evaluation a trans styloid perilunate dislocation is
also present.

*** End of Addendum ***
FINDINGS: An acute nondisplaced fracture is seen involving the left ulnar
styloid. An additional fracture is seen along the dorsal aspect of
the distal left radius. There is no evidence of dislocation. There
is no evidence of arthropathy or other focal bone abnormality. Mild
soft tissue swelling is seen adjacent to the previously noted
fracture sites.
IMPRESSION: Acute fractures of the distal left radius and ulnar styloid.

## 2021-07-26 DIAGNOSIS — K219 Gastro-esophageal reflux disease without esophagitis: Secondary | ICD-10-CM | POA: Diagnosis not present

## 2021-07-26 DIAGNOSIS — Z6832 Body mass index (BMI) 32.0-32.9, adult: Secondary | ICD-10-CM | POA: Diagnosis not present

## 2021-08-03 ENCOUNTER — Other Ambulatory Visit (HOSPITAL_COMMUNITY): Payer: Self-pay

## 2021-08-21 DIAGNOSIS — H903 Sensorineural hearing loss, bilateral: Secondary | ICD-10-CM | POA: Diagnosis not present

## 2021-08-23 DIAGNOSIS — E785 Hyperlipidemia, unspecified: Secondary | ICD-10-CM | POA: Diagnosis not present

## 2021-08-23 DIAGNOSIS — Z6832 Body mass index (BMI) 32.0-32.9, adult: Secondary | ICD-10-CM | POA: Diagnosis not present

## 2021-09-13 DIAGNOSIS — Z6832 Body mass index (BMI) 32.0-32.9, adult: Secondary | ICD-10-CM | POA: Diagnosis not present

## 2021-09-13 DIAGNOSIS — M2550 Pain in unspecified joint: Secondary | ICD-10-CM | POA: Diagnosis not present

## 2021-09-15 IMAGING — MG DIGITAL SCREENING BILAT W/ TOMO W/ CAD
6 of 12 series · 6 of 36 positions shown · non-contrast
Comparison: Previous exam(s).

CLINICAL DATA: Screening.

EXAM:
DIGITAL SCREENING BILATERAL MAMMOGRAM WITH TOMO AND CAD

[L MLO synth-2D]
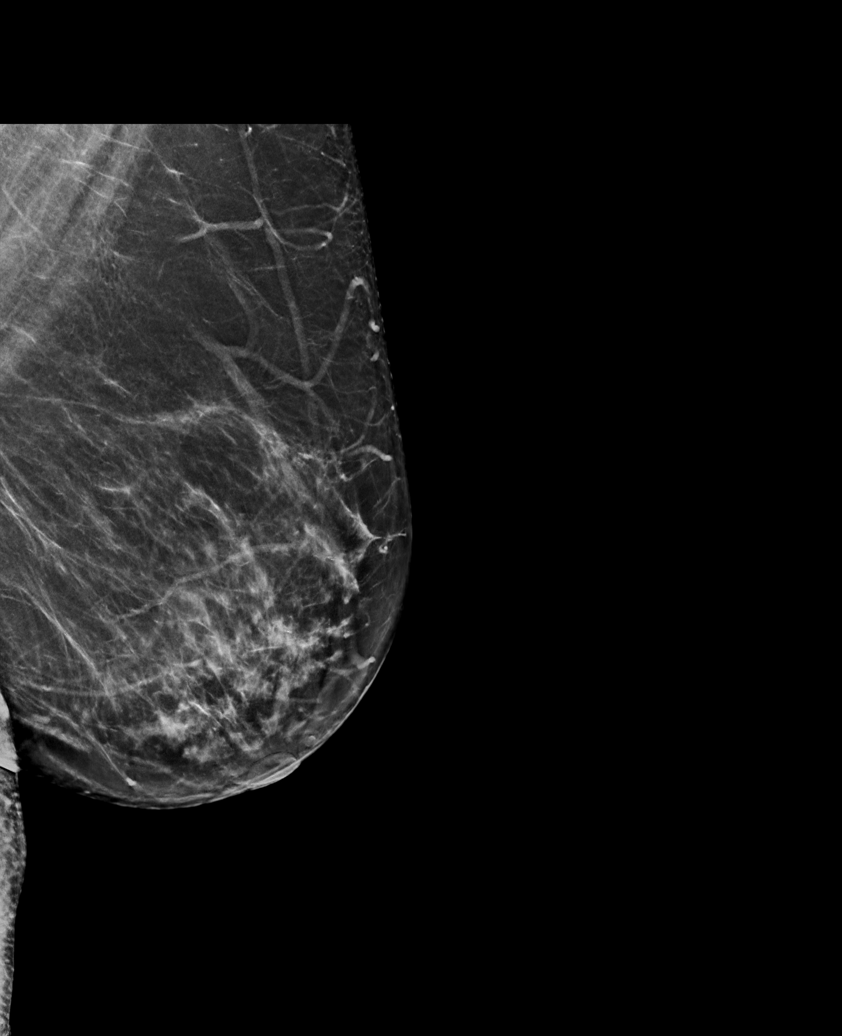

[R MLO synth-2D]
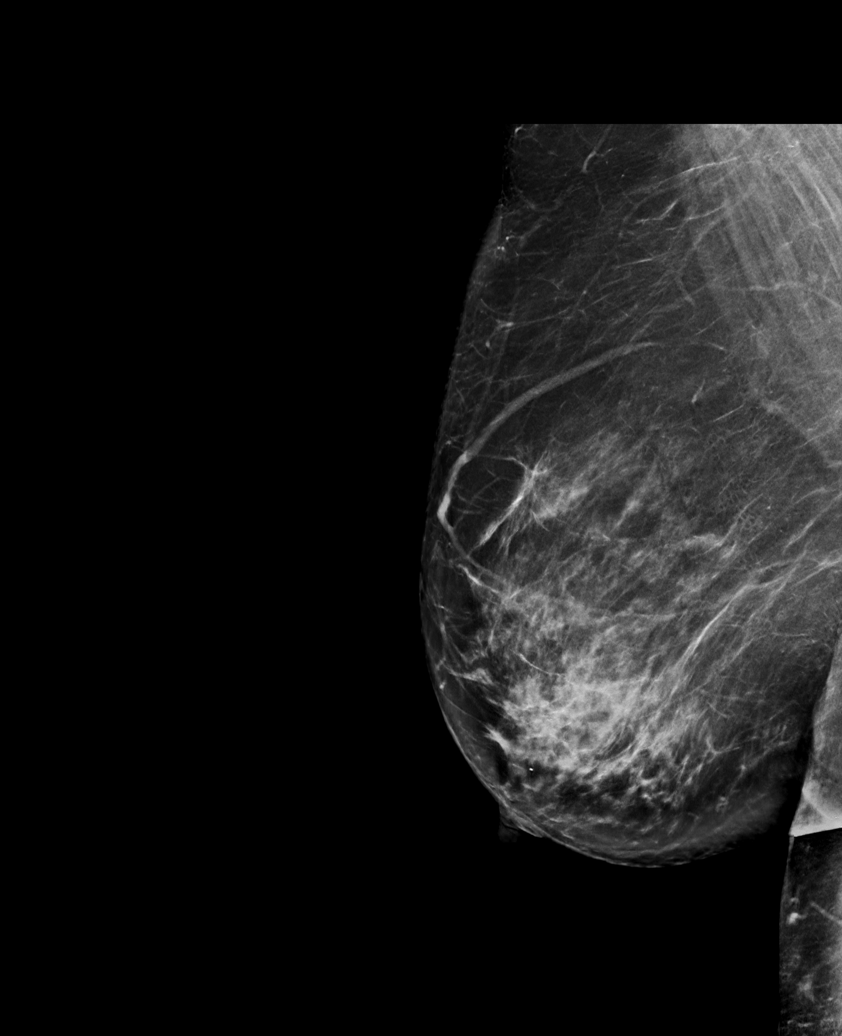

[R CC synth-2D (1 of 2)]
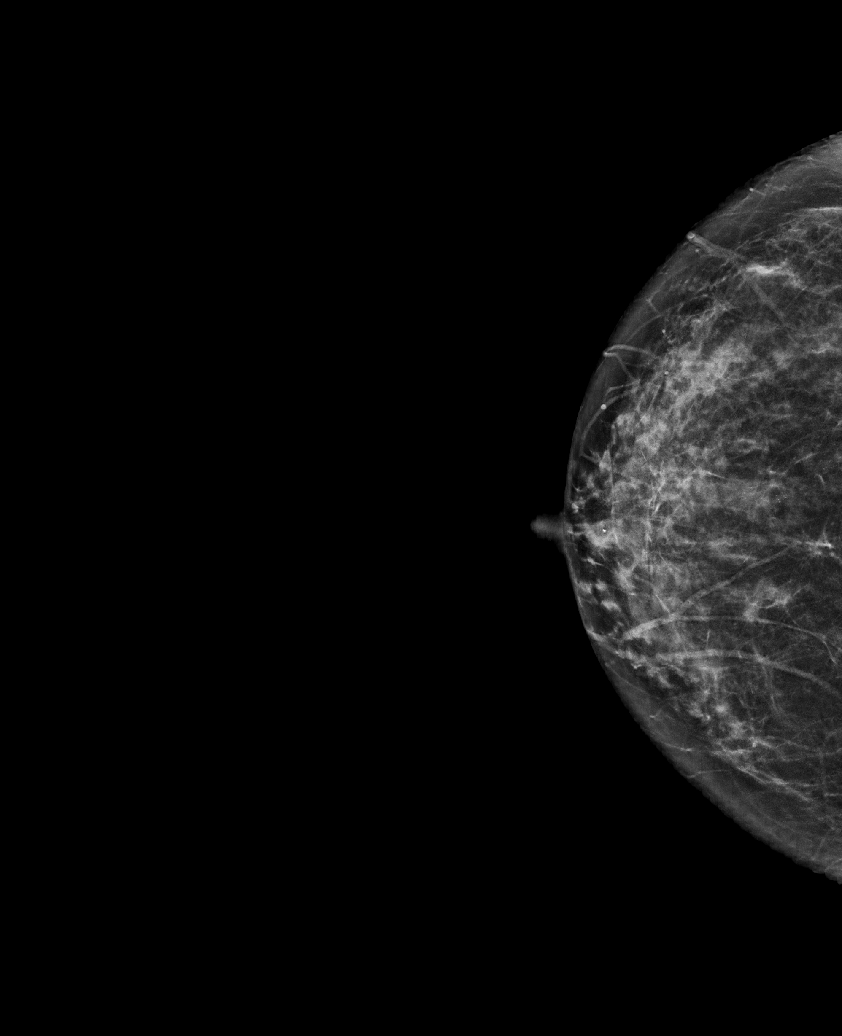

[L CC synth-2D (1 of 2)]
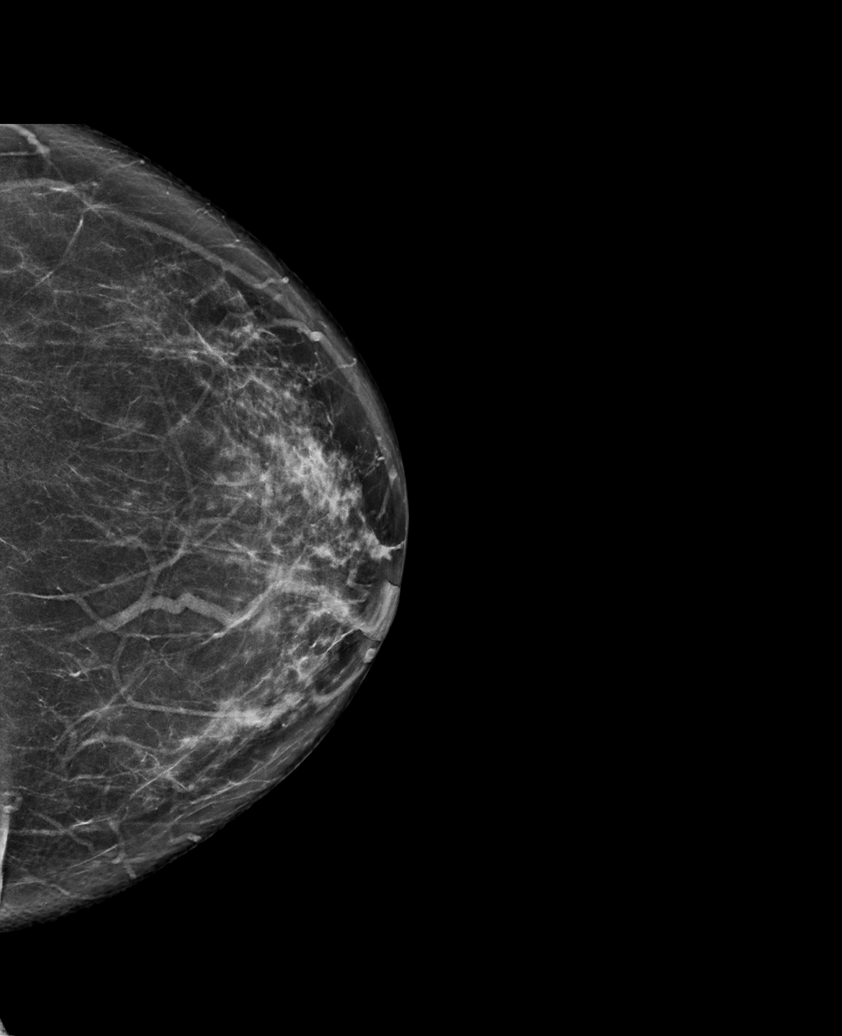

[L CC synth-2D (2 of 2)]
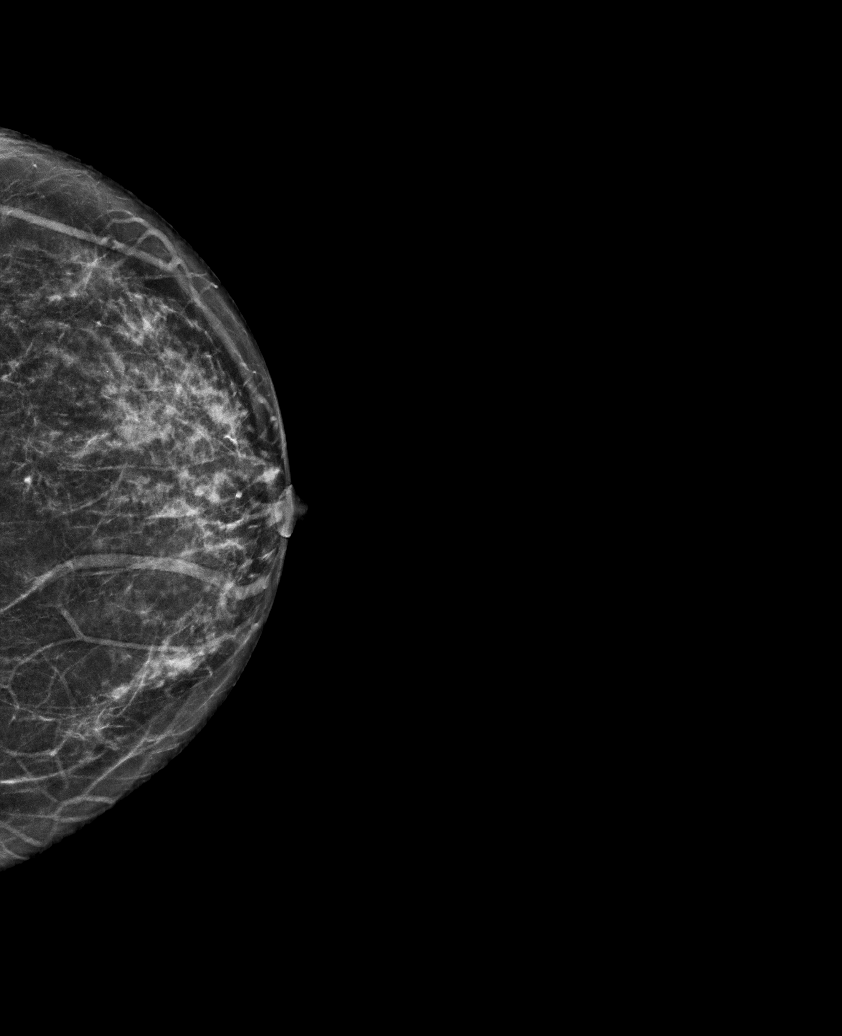

[R CC synth-2D (2 of 2)]
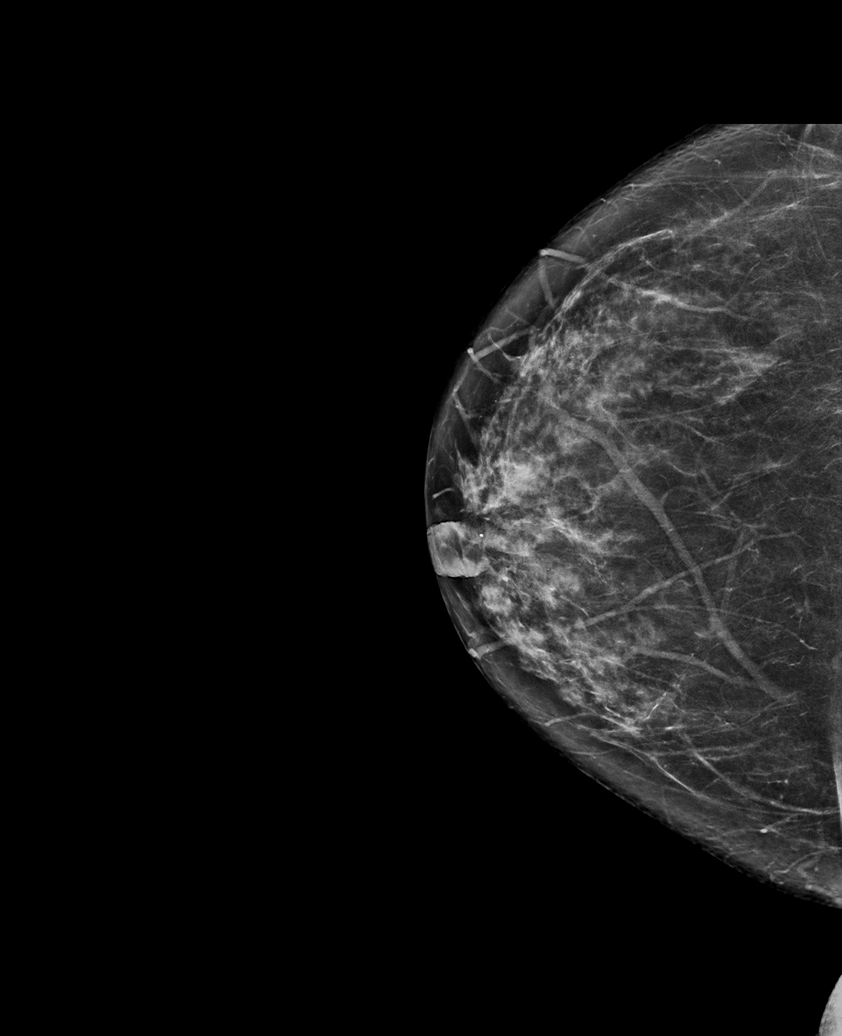

[6 of 36 positions shown; findings below may reference images not displayed]

ACR Breast Density Category c: The breast tissue is heterogeneously
dense, which may obscure small masses.
FINDINGS: There are no findings suspicious for malignancy. Images were
processed with CAD.
IMPRESSION: No mammographic evidence of malignancy. A result letter of this
screening mammogram will be mailed directly to the patient.

RECOMMENDATION:
Screening mammogram in one year. (Code:[id number])

BI-RADS CATEGORY  1: Negative.

## 2021-10-04 DIAGNOSIS — N951 Menopausal and female climacteric states: Secondary | ICD-10-CM | POA: Diagnosis not present

## 2021-10-16 ENCOUNTER — Ambulatory Visit (INDEPENDENT_AMBULATORY_CARE_PROVIDER_SITE_OTHER): Payer: 59 | Admitting: Internal Medicine

## 2021-10-16 ENCOUNTER — Other Ambulatory Visit (HOSPITAL_COMMUNITY): Payer: Self-pay

## 2021-10-16 ENCOUNTER — Other Ambulatory Visit: Payer: Self-pay

## 2021-10-16 ENCOUNTER — Encounter: Payer: Self-pay | Admitting: Internal Medicine

## 2021-10-16 VITALS — BP 120/88 | HR 78 | Temp 98.2°F | Ht 63.5 in | Wt 184.3 lb

## 2021-10-16 DIAGNOSIS — E7800 Pure hypercholesterolemia, unspecified: Secondary | ICD-10-CM

## 2021-10-16 DIAGNOSIS — K219 Gastro-esophageal reflux disease without esophagitis: Secondary | ICD-10-CM | POA: Diagnosis not present

## 2021-10-16 DIAGNOSIS — F419 Anxiety disorder, unspecified: Secondary | ICD-10-CM

## 2021-10-16 DIAGNOSIS — E6609 Other obesity due to excess calories: Secondary | ICD-10-CM | POA: Diagnosis not present

## 2021-10-16 DIAGNOSIS — Z6832 Body mass index (BMI) 32.0-32.9, adult: Secondary | ICD-10-CM | POA: Diagnosis not present

## 2021-10-16 MED ORDER — CITALOPRAM HYDROBROMIDE 10 MG PO TABS
10.0000 mg | ORAL_TABLET | Freq: Every day | ORAL | 2 refills | Status: DC
  Filled 2021-10-16: qty 90, 90d supply, fill #0

## 2021-10-16 MED ORDER — ATORVASTATIN CALCIUM 20 MG PO TABS
20.0000 mg | ORAL_TABLET | Freq: Every day | ORAL | 2 refills | Status: DC
  Filled 2021-10-16: qty 90, 90d supply, fill #0
  Filled 2021-11-09: qty 78, 90d supply, fill #0
  Filled 2022-02-26: qty 78, 90d supply, fill #1
  Filled 2022-06-16: qty 78, 90d supply, fill #2

## 2021-10-16 NOTE — Patient Instructions (Signed)
Mediterranean Diet °A Mediterranean diet refers to food and lifestyle choices that are based on the traditions of countries located on the [place]. It focuses on eating more fruits, vegetables, whole grains, beans, nuts, seeds, and heart-healthy fats, and eating less dairy, meat, eggs, and processed foods with added sugar, salt, and fat. This way of eating has been shown to help prevent certain conditions and improve outcomes for people who have chronic diseases, like kidney disease and heart disease. °What are tips for following this plan? °Reading food labels °Check the serving size of packaged foods. For foods such as rice and pasta, the serving size refers to the amount of cooked product, not dry. °Check the total fat in packaged foods. Avoid foods that have saturated fat or trans fats. °Check the ingredient list for added sugars, such as corn syrup. °Shopping ° °Buy a variety of foods that offer a balanced diet, including: °Fresh fruits and vegetables (produce). °Grains, beans, nuts, and seeds. Some of these may be available in unpackaged forms or large amounts (in bulk). °Fresh seafood. °Poultry and eggs. °Low-fat dairy products. °Buy whole ingredients instead of prepackaged foods. °Buy fresh fruits and vegetables in-season from local farmers markets. °Buy plain frozen fruits and vegetables. °If you do not have access to quality fresh seafood, buy precooked frozen shrimp or canned fish, such as tuna, salmon, or sardines. °Stock your pantry so you always have certain foods on hand, such as olive oil, canned tuna, canned tomatoes, rice, pasta, and beans. °Cooking °Cook foods with extra-virgin olive oil instead of using butter or other vegetable oils. °Have meat as a side dish, and have vegetables or grains as your main dish. This means having meat in small portions or adding small amounts of meat to foods like pasta or stew. °Use beans or vegetables instead of meat in common dishes like chili or  lasagna. °Experiment with different cooking methods. Try roasting, broiling, steaming, and sautéing vegetables. °Add frozen vegetables to soups, stews, pasta, or rice. °Add nuts or seeds for added healthy fats and plant protein at each meal. You can add these to yogurt, salads, or vegetable dishes. °Marinate fish or vegetables using olive oil, lemon juice, garlic, and fresh herbs. °Meal planning °Plan to eat one vegetarian meal one day each week. Try to work up to two vegetarian meals, if possible. °Eat seafood two or more times a week. °Have healthy snacks readily available, such as: °Vegetable sticks with hummus. °Greek yogurt. °Fruit and nut trail mix. °Eat balanced meals throughout the week. This includes: °Fruit: 2-3 servings a day. °Vegetables: 4-5 servings a day. °Low-fat dairy: 2 servings a day. °Fish, poultry, or lean meat: 1 serving a day. °Beans and legumes: 2 or more servings a week. °Nuts and seeds: 1-2 servings a day. °Whole grains: 6-8 servings a day. °Extra-virgin olive oil: 3-4 servings a day. °Limit red meat and sweets to only a few servings a month. °Lifestyle ° °Cook and eat meals together with your family, when possible. °Drink enough fluid to keep your urine pale yellow. °Be physically active every day. This includes: °Aerobic exercise like running or swimming. °Leisure activities like gardening, walking, or housework. °Get 7-8 hours of sleep each night. °If recommended by your health care provider, drink red wine in moderation. This means 1 glass a day for nonpregnant women and 2 glasses a day for men. A glass of wine equals 5 oz (150 mL). °What foods should I eat? °Fruits °Apples. Apricots. Avocado. Berries. Bananas. Cherries. Dates.   Figs. Grapes. Lemons. Melon. Oranges. Peaches. Plums. Pomegranate. °Vegetables °Artichokes. Beets. Broccoli. Cabbage. Carrots. Eggplant. Green beans. Chard. Kale. Spinach. Onions. Leeks. Peas. Squash. Tomatoes. Peppers. Radishes. °Grains °Whole-grain pasta. Brown  rice. Bulgur wheat. Polenta. Couscous. Whole-wheat bread. Oatmeal. Quinoa. °Meats and other proteins °Beans. Almonds. Sunflower seeds. Pine nuts. Peanuts. Cod. Salmon. Scallops. Shrimp. Tuna. Tilapia. Clams. Oysters. Eggs. Poultry without skin. °Dairy °Low-fat milk. Cheese. Greek yogurt. °Fats and oils °Extra-virgin olive oil. Avocado oil. Grapeseed oil. °Beverages °Water. Red wine. Herbal tea. °Sweets and desserts °Greek yogurt with honey. Baked apples. Poached pears. Trail mix. °Seasonings and condiments °Basil. Cilantro. Coriander. Cumin. Mint. Parsley. Sage. Rosemary. Tarragon. Garlic. Oregano. Thyme. Pepper. Balsamic vinegar. Tahini. Hummus. Tomato sauce. Olives. Mushrooms. °The items listed above may not be a complete list of foods and beverages you can eat. Contact a dietitian for more information. °What foods should I limit? °This is a list of foods that should be eaten rarely or only on special occasions. °Fruits °Fruit canned in syrup. °Vegetables °Deep-fried potatoes (french fries). °Grains °Prepackaged pasta or rice dishes. Prepackaged cereal with added sugar. Prepackaged snacks with added sugar. °Meats and other proteins °Beef. Pork. Lamb. Poultry with skin. Hot dogs. Bacon. °Dairy °Ice cream. Sour cream. Whole milk. °Fats and oils °Butter. Canola oil. Vegetable oil. Beef fat (tallow). Lard. °Beverages °Juice. Sugar-sweetened soft drinks. Beer. Liquor and spirits. °Sweets and desserts °Cookies. Cakes. Pies. Candy. °Seasonings and condiments °Mayonnaise. Pre-made sauces and marinades. °The items listed above may not be a complete list of foods and beverages you should limit. Contact a dietitian for more information. °Summary °The Mediterranean diet includes both food and lifestyle choices. °Eat a variety of fresh fruits and vegetables, beans, nuts, seeds, and whole grains. °Limit the amount of red meat and sweets that you eat. °If recommended by your health care provider, drink red wine in moderation.  This means 1 glass a day for nonpregnant women and 2 glasses a day for men. A glass of wine equals 5 oz (150 mL). °This information is not intended to replace advice given to you by your health care provider. Make sure you discuss any questions you have with your health care provider. °Document Revised: 10/29/2019 Document Reviewed: 08/26/2019 °Elsevier Patient Education © 2022 [employer] ° °

## 2021-10-16 NOTE — Progress Notes (Signed)
I,Katawbba Wiggins,acting as a Education administrator for Maximino Greenland, MD.,have documented all relevant documentation on the behalf of Maximino Greenland, MD,as directed by  Maximino Greenland, MD while in the presence of Maximino Greenland, MD.  This visit occurred during the SARS-CoV-2 public health emergency.  Safety protocols were in place, including screening questions prior to the visit, additional usage of staff PPE, and extensive cleaning of exam room while observing appropriate contact time as indicated for disinfecting solutions.  Subjective:     Patient ID: Victoria Herman , female    DOB: 1961/05/18 , 61 y.o.   MRN: [id number]   Chief Complaint  Patient presents with   Hyperlipidemia    HPI  The patient is here today for a follow-up on her cholesterol. She reports compliance with meds. Denies headaches, chest pain and shortness of breath. She states she is exercising regularly. Also going to [facility] for their weight management program.   Hyperlipidemia This is a chronic problem. The current episode started more than 1 year ago. The problem is uncontrolled. Recent lipid tests were reviewed and are high. Exacerbating diseases include obesity. Current antihyperlipidemic treatment includes herbal therapy. Compliance problems include adherence to exercise.     Past Medical History:  Diagnosis Date   Allergy    Anxiety    GERD (gastroesophageal reflux disease)    Hyperlipidemia    Seasonal allergies    Superficial thrombophlebitis 03-2014, 07-2014     Family History  Problem Relation Age of Onset   Hypertension Mother    Diabetes Mother    Varicose Veins Mother    Varicose Veins Father    Cancer Father    Hypertension Sister    Diabetes Sister    Thyroid disease Sister    Varicose Veins Brother    Hypertension Brother    Hypertension Brother    Varicose Veins Brother    Healthy Daughter    Migraines Daughter    Colon cancer Neg Hx    Esophageal cancer Neg Hx    Rectal cancer Neg Hx     Stomach cancer Neg Hx    Breast cancer Neg Hx      Current Outpatient Medications:    Barberry-Oreg Grape-Goldenseal (BERBERINE COMPLEX PO), Take 2 tablets by mouth daily., Disp: , Rfl:    Calcium Carb-Cholecalciferol (CALCIUM 600 + D PO), Take 1 tablet by mouth daily. Vit D 12.5, Disp: , Rfl:    cetirizine (ZYRTEC) 10 MG tablet, Take 10 mg by mouth daily., Disp: , Rfl:    Cholecalciferol (VITAMIN D3 SUPER STRENGTH) 50 MCG (2000 UT) CAPS, Take 2,000 Units by mouth daily., Disp: , Rfl:    EPINEPHrine 0.3 mg/0.3 mL IJ SOAJ injection, Inject 0.3 mg into the skin once as needed., Disp: , Rfl:    mometasone (NASONEX) 50 MCG/ACT nasal spray, Use 2 sprays in each nostril once daily, Disp: 17 g, Rfl: 5   atorvastatin (LIPITOR) 20 MG tablet, Take 1 tablet (20 mg total) by mouth daily Monday through Saturday, skip Sunday, Disp: 90 tablet, Rfl: 2   citalopram (CELEXA) 10 MG tablet, Take 1 tablet (10 mg total) by mouth daily., Disp: 90 tablet, Rfl: 2   Allergies  Allergen Reactions   E-Mycin [Erythromycin] Diarrhea   Sulfa Antibiotics Rash     Review of Systems  Constitutional: Negative.   Respiratory: Negative.    Cardiovascular: Negative.   Gastrointestinal: Negative.   Psychiatric/Behavioral: Negative.    All other systems reviewed and are negative.  Today's Vitals   10/16/21 1446  BP: 120/88  Pulse: 78  Temp: 98.2 F (36.8 C)  Weight: 184 lb 4.8 oz (83.6 kg)  Height: 5' 3.5" (1.613 m)   Body mass index is 32.14 kg/m.  Wt Readings from Last 3 Encounters:  10/16/21 184 lb 4.8 oz (83.6 kg)  05/29/21 204 lb 3.2 oz (92.6 kg)  05/01/21 206 lb 3.2 oz (93.5 kg)    BP Readings from Last 3 Encounters:  10/16/21 120/88  05/29/21 114/75  05/01/21 117/81    Objective:  Physical Exam Vitals and nursing note reviewed.  Constitutional:      Appearance: Normal appearance.  HENT:     Head: Normocephalic and atraumatic.     Nose:     Comments: Masked     Mouth/Throat:      Comments: Masked  Eyes:     Extraocular Movements: Extraocular movements intact.  Cardiovascular:     Rate and Rhythm: Normal rate and regular rhythm.     Heart sounds: Normal heart sounds.  Pulmonary:     Effort: Pulmonary effort is normal.     Breath sounds: Normal breath sounds.  Musculoskeletal:     Cervical back: Normal range of motion.  Skin:    General: Skin is warm.  Neurological:     General: No focal deficit present.     Mental Status: She is alert.  Psychiatric:        Mood and Affect: Mood normal.        Behavior: Behavior normal.        Assessment And Plan:     1. Pure hypercholesterolemia Comments: Chronic, I will check labs as below. Importance of medication/dietary compliance was d/w patient. She will f/u in 6 months for a full CPE.  - ALT - Lipid panel  2. Anxiety Comments: Chronic, refill for citalopram was sent to her local pharmacy. She will f/u in six months for re-evaluation.  - citalopram (CELEXA) 10 MG tablet; Take 1 tablet (10 mg total) by mouth daily.  Dispense: 90 tablet; Refill: 2  3. Class 1 obesity due to excess calories without serious comorbidity with body mass index (BMI) of 32.0 to 32.9 in adult Comments: She was congratulated on her 20 lb weight loss since her last visit, Sept 2022. She is encouraged to keep up the great work, wants to lose another 20 lbs.   Patient was given opportunity to ask questions. Patient verbalized understanding of the plan and was able to repeat key elements of the plan. All questions were answered to their satisfaction.   I, Maximino Greenland, MD, have reviewed all documentation for this visit. The documentation on 10/16/21 for the exam, diagnosis, procedures, and orders are all accurate and complete.   IF YOU HAVE BEEN REFERRED TO A SPECIALIST, IT MAY TAKE 1-2 WEEKS TO SCHEDULE/PROCESS THE REFERRAL. IF YOU HAVE NOT HEARD FROM US/SPECIALIST IN TWO WEEKS, PLEASE GIVE [place] A CALL AT [phone].   THE PATIENT IS  ENCOURAGED TO PRACTICE SOCIAL DISTANCING DUE TO THE COVID-19 PANDEMIC.

## 2021-10-16 NOTE — Progress Notes (Signed)
Office Visit Note  Patient: Victoria Herman             Date of Birth: 02-Apr-1961           MRN: [id number]             PCP: Glendale Chard, MD Referring: Glendale Chard, MD Visit Date: 10/30/2021 Occupation: @GUAROCC @  Subjective:  Pain of the Left Hip (Pain over trochanter due to increase activity/ exercise )   History of Present Illness: Victoria Herman is a 61 y.o. female with a history of Raynaud's phenomenon.  She states that her hands mostly stay cold and turn colors.  She has not had any digital ulcers.  She states her fingers feel numb when it is cold.  She is not able to use hand warmer.  She has gloves which she uses when she is out in the colder temperatures.  She denies any history of oral ulcers, nasal ulcers, malar rash, photosensitivity, lymphadenopathy or inflammatory arthritis.  Activities of Daily Living:  Patient reports morning stiffness for 3 hours.  States more soreness than stiffness  Patient Denies nocturnal pain.  Difficulty dressing/grooming: Denies Difficulty climbing stairs: Denies Difficulty getting out of chair: Denies Difficulty using hands for taps, buttons, cutlery, and/or writing: Denies  Review of Systems  Constitutional:  Positive for activity change. Negative for fatigue, night sweats, weight gain and weight loss.       Has increased activity / exercise trying to lose weight  HENT: Negative.  Negative for mouth sores, trouble swallowing, trouble swallowing, mouth dryness and nose dryness.   Eyes: Negative.  Negative for pain, redness, visual disturbance and dryness.  Respiratory: Negative.  Negative for cough, shortness of breath and difficulty breathing.   Cardiovascular: Negative.  Negative for chest pain, palpitations, hypertension, irregular heartbeat and swelling in legs/feet.  Gastrointestinal: Negative.  Negative for blood in stool, constipation and diarrhea.  Endocrine: Negative for increased urination.  Genitourinary: Negative.   Negative for vaginal dryness.  Musculoskeletal:  Positive for joint pain and joint pain. Negative for joint swelling, myalgias, muscle weakness, morning stiffness, muscle tenderness and myalgias.       Hip pain left   Skin:  Positive for color change. Negative for rash, hair loss, skin tightness, ulcers and sensitivity to sunlight.  Allergic/Immunologic: Negative for susceptible to infections.  Neurological: Negative.  Negative for dizziness, memory loss, night sweats and weakness.  Hematological: Negative.  Negative for swollen glands.  Psychiatric/Behavioral: Negative.  Negative for depressed mood and sleep disturbance. The patient is not nervous/anxious.    PMFS History:  Patient Active Problem List   Diagnosis Date Noted   Stress incontinence in female 04/10/2021   Abnormal serum iron level 04/10/2021   Anxiety 04/10/2021   Class 2 obesity due to excess calories without serious comorbidity with body mass index (BMI) of 36.0 to 36.9 in adult 04/10/2021   Superficial thrombophlebitis 03/21/2014    Past Medical History:  Diagnosis Date   Allergy    Anxiety    GERD (gastroesophageal reflux disease)    Hyperlipidemia    Seasonal allergies    Superficial thrombophlebitis 03-2014, 07-2014    Family History  Problem Relation Age of Onset   Hypertension Mother    Diabetes Mother    Varicose Veins Mother    Varicose Veins Father    Cancer Father    Hypertension Sister    Diabetes Sister    Thyroid disease Sister    Varicose Veins Brother  Hypertension Brother    Hypertension Brother    Varicose Veins Brother    Healthy Daughter    Migraines Daughter    Colon cancer Neg Hx    Esophageal cancer Neg Hx    Rectal cancer Neg Hx    Stomach cancer Neg Hx    Breast cancer Neg Hx    Past Surgical History:  Procedure Laterality Date   COLONOSCOPY     ORIF WRIST FRACTURE Left 04/12/2020   Procedure: OPEN REDUCTION INTERNAL FIXATION (ORIF) WRIST FRACTURE and repair reconstruction  as indicated;  Surgeon: Iran Planas, MD;  Location: [facility];  Service: Orthopedics;  Laterality: Left;  with IV sedation  Needs 2 hours   WISDOM TOOTH EXTRACTION     Social History   Social History Narrative   Not on file   Immunization History  Administered Date(s) Administered   Influenza,inj,Quad PF,6+ Mos 07/16/2019, 07/28/2020, 07/20/2021   Influenza-Unspecified 07/17/2017   PFIZER(Purple Top)SARS-COV-2 Vaccination 09/30/2019, 10/20/2019, 04/05/2020, 07/21/2020   Zoster Recombinat (Shingrix) 05/12/2018, 07/20/2018     Objective: Vital Signs: BP 114/74    Pulse (!) 56    Ht 5' 3.5" (1.613 m)    Wt 183 lb (83 kg)    LMP 07/23/2013    BMI 31.91 kg/m    Physical Exam Vitals and nursing note reviewed.  Constitutional:      Appearance: She is well-developed.  HENT:     Head: Normocephalic and atraumatic.  Eyes:     Conjunctiva/sclera: Conjunctivae normal.  Cardiovascular:     Rate and Rhythm: Normal rate and regular rhythm.     Heart sounds: Normal heart sounds.  Pulmonary:     Effort: Pulmonary effort is normal.     Breath sounds: Normal breath sounds.  Abdominal:     General: Bowel sounds are normal.     Palpations: Abdomen is soft.  Musculoskeletal:     Cervical back: Normal range of motion.  Lymphadenopathy:     Cervical: No cervical adenopathy.  Skin:    General: Skin is warm and dry.     Capillary Refill: Capillary refill takes 2 to 3 seconds.     Comments: No nailbed capillary changes were noted.  No sclerodactyly or telangiectasias were noted.  Neurological:     Mental Status: She is alert and oriented to person, place, and time.  Psychiatric:        Behavior: Behavior normal.     Musculoskeletal Exam: C-spine was in good range of motion.  Shoulder joints, elbow joints, wrist joints, MCPs PIPs and DIPs with good range of motion with no synovitis.  Hip joints, knee joints, ankles, MTPs and PIPs with good range of motion with no synovitis.  CDAI Exam: CDAI  Score: -- Patient Global: --; Provider Global: -- Swollen: --; Tender: -- Joint Exam 10/30/2021   No joint exam has been documented for this visit   There is currently no information documented on the homunculus. Go to the Rheumatology activity and complete the homunculus joint exam.  Investigation: No additional findings.  Imaging: No results found.  Recent Labs: Lab Results  Component Value Date   WBC 7.8 04/16/2021   HGB 13.5 04/16/2021   PLT 212 04/16/2021   NA 137 04/16/2021   K 3.9 04/16/2021   CL 102 04/16/2021   CO2 26 (A) 04/16/2021   GLUCOSE 95 04/03/2020   BUN 13 04/16/2021   CREATININE 0.7 04/16/2021   BILITOT 0.3 04/03/2020   ALKPHOS 87 04/16/2021   AST 17  04/16/2021   ALT 15 10/16/2021   PROT 6.5 04/03/2020   ALBUMIN 4.3 04/16/2021   CALCIUM 9.0 04/16/2021   GFRAA 109 04/16/2021    Speciality Comments: No specialty comments available.  Procedures:  No procedures performed Allergies: E-mycin [erythromycin] and Sulfa antibiotics   Assessment / Plan:     Visit Diagnoses: Raynaud's phenomenon without gangrene - History of severe Raynauds since in her 25s.  She continues to have severe Raynaud's.  She had digital cyanosis on examination today.  No digital ulcers were noted.  No nailbed capillary changes were noted.  We discussed trial of amlodipine.  She was in agreement.  Side effects were discussed.  She was given amlodipine 5 mg, half tablet p.o. nightly if tolerated she will increase it to 1 tablet p.o. nightly.  I advised her to monitor blood pressure closely.  I advised her to contact me in case she develops any side effects.  Primary osteoarthritis of both hands - Clinical and radiographic findings are consistent with osteoarthritis.  Joint protection muscle strengthening was discussed.  S/P ORIF (open reduction internal fixation) fracture - Left wrist July 2021.  She did well after PT and OT  Trochanteric bursitis, left hip-she has been having some  discomfort in the left trochanteric area from walking.  Stretching exercises were discussed.  Coccygodynia-improved.  Osteopenia of multiple sites - February 9, 2021The BMD measured at [place] is 0.962 g/cm2 with a T-score of-1.9.  She should be getting repeat DEXA scan.  Use of calcium rich diet with vitamin D and exercise was discussed.  History of anxiety  Stress incontinence in female  BMI 34.0-34.9,adult  Orders: No orders of the defined types were placed in this encounter.  Meds ordered this encounter  Medications   amLODipine (NORVASC) 5 MG tablet    Sig: Take 1 tablet (5 mg total) by mouth daily.    Dispense:  30 tablet    Refill:  2     Follow-Up Instructions: Return in about 3 months (around 01/28/2022) for Raynauds.   Bo Merino, MD  Note - This record has been created using Editor, commissioning.  Chart creation errors have been sought, but may not always  have been located. Such creation errors do not reflect on  the standard of medical care.

## 2021-10-17 ENCOUNTER — Other Ambulatory Visit: Payer: Self-pay | Admitting: Internal Medicine

## 2021-10-17 DIAGNOSIS — E2839 Other primary ovarian failure: Secondary | ICD-10-CM

## 2021-10-17 LAB — LIPID PANEL
Chol/HDL Ratio: 2.1 ratio (ref 0.0–4.4)
Cholesterol, Total: 158 mg/dL (ref 100–199)
HDL: 77 mg/dL (ref >39)
LDL Chol Calc (NIH): 71 mg/dL (ref 0–99)
Triglycerides: 44 mg/dL (ref 0–149)
VLDL Cholesterol Cal: 10 mg/dL (ref 5–40)

## 2021-10-17 LAB — ALT: ALT: 15 IU/L (ref 0–32)

## 2021-10-17 NOTE — Telephone Encounter (Signed)
You seen her recently was sent to me

## 2021-10-30 ENCOUNTER — Ambulatory Visit (INDEPENDENT_AMBULATORY_CARE_PROVIDER_SITE_OTHER): Payer: 59 | Admitting: Rheumatology

## 2021-10-30 ENCOUNTER — Other Ambulatory Visit (HOSPITAL_COMMUNITY): Payer: Self-pay

## 2021-10-30 ENCOUNTER — Encounter: Payer: Self-pay | Admitting: Rheumatology

## 2021-10-30 ENCOUNTER — Other Ambulatory Visit: Payer: Self-pay

## 2021-10-30 VITALS — BP 114/74 | HR 56 | Ht 63.5 in | Wt 183.0 lb

## 2021-10-30 DIAGNOSIS — M19041 Primary osteoarthritis, right hand: Secondary | ICD-10-CM | POA: Diagnosis not present

## 2021-10-30 DIAGNOSIS — E559 Vitamin D deficiency, unspecified: Secondary | ICD-10-CM | POA: Diagnosis not present

## 2021-10-30 DIAGNOSIS — Z6834 Body mass index (BMI) 34.0-34.9, adult: Secondary | ICD-10-CM | POA: Diagnosis not present

## 2021-10-30 DIAGNOSIS — M8589 Other specified disorders of bone density and structure, multiple sites: Secondary | ICD-10-CM | POA: Diagnosis not present

## 2021-10-30 DIAGNOSIS — Z8659 Personal history of other mental and behavioral disorders: Secondary | ICD-10-CM

## 2021-10-30 DIAGNOSIS — M7062 Trochanteric bursitis, left hip: Secondary | ICD-10-CM | POA: Diagnosis not present

## 2021-10-30 DIAGNOSIS — Z8781 Personal history of (healed) traumatic fracture: Secondary | ICD-10-CM

## 2021-10-30 DIAGNOSIS — M533 Sacrococcygeal disorders, not elsewhere classified: Secondary | ICD-10-CM

## 2021-10-30 DIAGNOSIS — N393 Stress incontinence (female) (male): Secondary | ICD-10-CM | POA: Diagnosis not present

## 2021-10-30 DIAGNOSIS — Z9889 Other specified postprocedural states: Secondary | ICD-10-CM | POA: Diagnosis not present

## 2021-10-30 DIAGNOSIS — I7300 Raynaud's syndrome without gangrene: Secondary | ICD-10-CM

## 2021-10-30 DIAGNOSIS — Z6831 Body mass index (BMI) 31.0-31.9, adult: Secondary | ICD-10-CM | POA: Diagnosis not present

## 2021-10-30 DIAGNOSIS — M19042 Primary osteoarthritis, left hand: Secondary | ICD-10-CM

## 2021-10-30 MED ORDER — AMLODIPINE BESYLATE 5 MG PO TABS
5.0000 mg | ORAL_TABLET | Freq: Every day | ORAL | 2 refills | Status: DC
  Filled 2021-10-30 – 2022-10-21 (×3): qty 30, 30d supply, fill #0

## 2021-11-09 ENCOUNTER — Other Ambulatory Visit (HOSPITAL_COMMUNITY): Payer: Self-pay

## 2021-11-20 DIAGNOSIS — Z6831 Body mass index (BMI) 31.0-31.9, adult: Secondary | ICD-10-CM | POA: Diagnosis not present

## 2021-11-20 DIAGNOSIS — E559 Vitamin D deficiency, unspecified: Secondary | ICD-10-CM | POA: Diagnosis not present

## 2021-12-24 DIAGNOSIS — M2550 Pain in unspecified joint: Secondary | ICD-10-CM | POA: Diagnosis not present

## 2021-12-24 DIAGNOSIS — Z6831 Body mass index (BMI) 31.0-31.9, adult: Secondary | ICD-10-CM | POA: Diagnosis not present

## 2022-01-10 DIAGNOSIS — E785 Hyperlipidemia, unspecified: Secondary | ICD-10-CM | POA: Diagnosis not present

## 2022-01-10 DIAGNOSIS — Z6831 Body mass index (BMI) 31.0-31.9, adult: Secondary | ICD-10-CM | POA: Diagnosis not present

## 2022-01-10 DIAGNOSIS — G479 Sleep disorder, unspecified: Secondary | ICD-10-CM | POA: Diagnosis not present

## 2022-01-10 DIAGNOSIS — N951 Menopausal and female climacteric states: Secondary | ICD-10-CM | POA: Diagnosis not present

## 2022-01-15 ENCOUNTER — Encounter: Payer: Self-pay | Admitting: Internal Medicine

## 2022-01-16 ENCOUNTER — Other Ambulatory Visit (INDEPENDENT_AMBULATORY_CARE_PROVIDER_SITE_OTHER): Payer: 59 | Admitting: Internal Medicine

## 2022-01-16 DIAGNOSIS — F419 Anxiety disorder, unspecified: Secondary | ICD-10-CM

## 2022-01-16 MED ORDER — CITALOPRAM HYDROBROMIDE 10 MG PO TABS
20.0000 mg | ORAL_TABLET | Freq: Every day | ORAL | 2 refills | Status: DC
  Filled 2022-01-16: qty 90, 45d supply, fill #0

## 2022-01-16 NOTE — Progress Notes (Signed)
PT would like increase in dosage of celexa, '10mg'$  to '20mg'$ . Admits to increased stress at work, now working 40 hours instead of 32 due to [employer].  ? ?She admits to home stressors as well.  I will increase dose as requested. Will see her in f/u 6-8 weeks. This can be done virtually at her convenience.  ? ?Time spent: 5 minutes ? ?RS ?

## 2022-01-17 ENCOUNTER — Other Ambulatory Visit: Payer: Self-pay | Admitting: Internal Medicine

## 2022-01-17 ENCOUNTER — Other Ambulatory Visit (HOSPITAL_COMMUNITY): Payer: Self-pay

## 2022-01-17 MED ORDER — CITALOPRAM HYDROBROMIDE 20 MG PO TABS
20.0000 mg | ORAL_TABLET | Freq: Every day | ORAL | 2 refills | Status: DC
  Filled 2022-01-17: qty 90, 90d supply, fill #0
  Filled 2022-05-02: qty 90, 90d supply, fill #1
  Filled 2022-07-27: qty 90, 90d supply, fill #2

## 2022-01-31 ENCOUNTER — Ambulatory Visit: Payer: 59 | Admitting: Rheumatology

## 2022-02-05 ENCOUNTER — Telehealth: Payer: 59 | Admitting: Physician Assistant

## 2022-02-05 ENCOUNTER — Encounter: Payer: Self-pay | Admitting: Internal Medicine

## 2022-02-05 ENCOUNTER — Other Ambulatory Visit (HOSPITAL_COMMUNITY): Payer: Self-pay

## 2022-02-05 DIAGNOSIS — K644 Residual hemorrhoidal skin tags: Secondary | ICD-10-CM | POA: Diagnosis not present

## 2022-02-05 MED ORDER — HYDROCORTISONE 2.5 % EX CREA
TOPICAL_CREAM | Freq: Two times a day (BID) | CUTANEOUS | 0 refills | Status: DC
  Filled 2022-02-05: qty 30, 15d supply, fill #0

## 2022-02-05 NOTE — Progress Notes (Signed)
I have spent 5 minutes in review of e-visit questionnaire, review and updating patient chart, medical decision making and response to patient.   Icarus Partch Cody Shequilla Goodgame, PA-C    

## 2022-02-05 NOTE — Progress Notes (Signed)
E-Visit for Hemorrhoid  We are sorry that you are not feeling well. We are here to help!  Hemorrhoids are swollen veins in the rectum. They can cause itching, bleeding, and pain. Hemorrhoids are very common.  In some cases, you can see or feel hemorrhoids around the outside of the rectum. In other cases, you cannot see them because they are hidden inside the rectum. Be patient - It can take months for this to improve or go away.   Hemorrhoids do not always cause symptoms. But when they do, symptoms can include: ?Itching of the skin around the anus ?Bleeding - Bleeding is usually painless. You might see bright red blood after using the toilet. ?Pain - If a blood clot forms inside a hemorrhoid, this can cause pain. It can also cause a lump that you might be able to feel.   What can I do to keep from getting more hemorrhoids? -- The most important thing you can do is to keep from getting constipated. You should have a bowel movement at least a few times a week. When you have a bowel movement, you also should not have to push too much. Plus, your bowel movements should not be too hard. Being constipated and having hard bowel movements can make hemorrhoids worse.   I have prescribed Topical Hydrocortisone ointment 2.5%.  Apply to area two times per day for 30 days  HOME CARE: Sitz Baths twice daily. Soak buttocks in 2 or 3 inches of warm water for 10 to 15 minutes. Do not add soap, bubble bath, or anything to the water. Stool softener such as Colace 100 mg twice daily AND Miralax 1 scoop daily until you have regular soft stools Over the counter Preparation H Tucks Pads Witch Hazel  Here are some steps you can take to avoid getting constipated or having hard stools:  ?Eat lots of fruits, vegetables, and other foods with fiber. Fiber helps to increase bowel movements. If you do not get enough fiber from your diet, you can take fiber supplements. These come in the form of powders, wafers, or  pills. Some examples are Metamucil, Citrucel, Benefiber and FiberCon. If you take a fiber supplement, be sure to read the label so you know how much to take. If you're not sure, ask your provider or nurse. ?Take medicines called "stool softeners" such as docusate sodium (sample brand names: Colace, Dulcolax). These medicines increase the number of bowel movements you have. They are safe to take and they can prevent problems later.  You should request a referral for a follow up evaluation with a Gastroenterologist (GI doctor) to evaluate this chronic and relapsing condition - even if it improves to see what further steps need to be taken. This is highly linked to chronic constipation and straining to have a bowel movement. It may require further treatment or surgical intervention.   GET HELP RIGHT AWAY IF: You develop severe pain You have heavy bleeding   FOLLOW UP WITH YOUR PRIMARY PROVIDER IF: If your symptoms do not improve within 10 days  MAKE SURE YOU  Understand these instructions. Will watch your condition. Will get help right away if you are not doing well or get worse.   Thank you for choosing an e-visit.  Your e-visit answers were reviewed by a board certified advanced clinical practitioner to complete your personal care plan. Depending upon the condition, your plan could have included both over the counter or prescription medications.  Please review your pharmacy choice. Make   sure the pharmacy is open so you can pick up prescription now. If there is a problem, you may contact your provider through MyChart messaging and have the prescription routed to another pharmacy.  Your safety is important to us. If you have drug allergies check your prescription carefully.   For the next 24 hours you can use MyChart to ask questions about today's visit, request a non-urgent call back, or ask for a work or school excuse. You will get an email in the next two days asking about your experience.  I hope that your e-visit has been valuable and will speed your recovery.  

## 2022-02-14 ENCOUNTER — Telehealth (INDEPENDENT_AMBULATORY_CARE_PROVIDER_SITE_OTHER): Payer: 59 | Admitting: Internal Medicine

## 2022-02-14 VITALS — BP 112/70 | Ht 63.5 in | Wt 181.0 lb

## 2022-02-14 DIAGNOSIS — F419 Anxiety disorder, unspecified: Secondary | ICD-10-CM

## 2022-02-14 DIAGNOSIS — Z566 Other physical and mental strain related to work: Secondary | ICD-10-CM | POA: Diagnosis not present

## 2022-02-14 NOTE — Progress Notes (Signed)
? ?Virtual Visit via Video  ? ?This visit type was conducted due to national recommendations for restrictions regarding the COVID-19 Pandemic (e.g. social distancing) in an effort to limit this patient's exposure and mitigate transmission in our community.  Due to her co-morbid illnesses, this patient is at least at moderate risk for complications without adequate follow up.  This format is felt to be most appropriate for this patient at this time.  All issues noted in this document were discussed and addressed.  A limited physical exam was performed with this format.   ? ?This visit type was conducted due to national recommendations for restrictions regarding the COVID-19 Pandemic (e.g. social distancing) in an effort to limit this patient's exposure and mitigate transmission in our community.  Patients identity confirmed using two different identifiers.  This format is felt to be most appropriate for this patient at this time.  All issues noted in this document were discussed and addressed.  No physical exam was performed (except for noted visual exam findings with Video Visits).   ? ?Date:  02/14/2022  ? ?ID:  Victoria Herman, DOB 1961-05-25, MRN [id number] ? ?Patient Location:  ?Work ? ?Provider location:   ?Office ? ? ? ?Chief Complaint:  " ? ?History of Present Illness:   ? ?Victoria Herman is a 61 y.o. female who presents via video conferencing for a telehealth visit today.   ? ?The patient does not have symptoms concerning for COVID-19 infection (fever, chills, cough, or new shortness of breath).  ? ?She presents today for virtual visit. She prefers this method of contact due to COVID-19 pandemic. She presents today for f/u anxiety. Recently, sx exacerbated by work stress. 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 Due to these stressors, she elected to increase dose of citalopram to '20mg'$  and states this did improve her mood.  ?  ? ?Past Medical History:  ?Diagnosis Date  ? Allergy   ?  Anxiety   ? GERD (gastroesophageal reflux disease)   ? Hyperlipidemia   ? Seasonal allergies   ? Superficial thrombophlebitis 03-2014, 07-2014  ? ?Past Surgical History:  ?Procedure Laterality Date  ? COLONOSCOPY    ? ORIF WRIST FRACTURE Left 04/12/2020  ? Procedure: OPEN REDUCTION INTERNAL FIXATION (ORIF) WRIST FRACTURE and repair reconstruction as indicated;  Surgeon: Iran Planas, MD;  Location: [facility];  Service: Orthopedics;  Laterality: Left;  with IV sedation ? Needs 2 hours  ? WISDOM TOOTH EXTRACTION    ?  ? ?Current Meds  ?Medication Sig  ? atorvastatin (LIPITOR) 20 MG tablet Take 1 tablet (20 mg total) by mouth daily Monday through Saturday, skip Sunday  ? Barberry-Oreg Grape-Goldenseal (BERBERINE COMPLEX PO) Take 2 tablets by mouth daily.  ? Calcium Carb-Cholecalciferol (CALCIUM 600 + D PO) Take 1 tablet by mouth daily. Vit D 12.5  ? cetirizine (ZYRTEC) 10 MG tablet Take 10 mg by mouth daily.  ? Cholecalciferol (VITAMIN D3 SUPER STRENGTH) 50 MCG (2000 UT) CAPS Take 2,000 Units by mouth daily.  ? citalopram (CELEXA) 20 MG tablet Take 1 tablet (20 mg total) by mouth daily.  ? EPINEPHrine 0.3 mg/0.3 mL IJ SOAJ injection Inject 0.3 mg into the skin once as needed.  ? hydrocortisone 2.5 % cream Apply topically 2 (two) times daily.  ? mometasone (NASONEX) 50 MCG/ACT nasal spray Use 2 sprays in each nostril once daily  ?  ? ?Allergies:   E-mycin [erythromycin] and Sulfa antibiotics  ? ?Social History  ? ?  Tobacco Use  ? Smoking status: Never  ? Smokeless tobacco: Never  ?Vaping Use  ? Vaping Use: Never used  ?Substance Use Topics  ? Alcohol use: Yes  ?  Comment: occas.  ? Drug use: No  ?  ? ?Family Hx: ?The patient's family history includes Cancer in her father; Diabetes in her mother and sister; Healthy in her daughter; Hypertension in her brother, brother, mother, and sister; Migraines in her daughter; Thyroid disease in her sister; Varicose Veins in her brother, brother, father, and mother. There is no history of  Colon cancer, Esophageal cancer, Rectal cancer, Stomach cancer, or Breast cancer. ? ?ROS:   ?Please see the history of present illness.    ?Review of Systems  ?Constitutional: Negative.   ?Respiratory: Negative.    ?Cardiovascular: Negative.   ?Gastrointestinal: Negative.   ?Neurological: Negative.   ?Psychiatric/Behavioral: Negative.     ?All other systems reviewed and are negative. ? ? ?Labs/Other Tests and Data Reviewed:   ? ?Recent Labs: ?04/16/2021: BUN 13; Creatinine 0.7; Hemoglobin 13.5; Platelets 212; Potassium 3.9; Sodium 137; TSH 1.12 ?10/16/2021: ALT 15  ? ?Recent Lipid Panel ?Lab Results  ?Component Value Date/Time  ? CHOL 158 10/16/2021 03:24 PM  ? TRIG 44 10/16/2021 03:24 PM  ? HDL 77 10/16/2021 03:24 PM  ? CHOLHDL 2.1 10/16/2021 03:24 PM  ? [place] 71 10/16/2021 03:24 PM  ? ? ?Wt Readings from Last 3 Encounters:  ?02/14/22 181 lb (82.1 kg)  ?10/30/21 183 lb (83 kg)  ?10/16/21 184 lb 4.8 oz (83.6 kg)  ?  ? ?Exam:   ? ?Vital Signs:  BP 112/70   Ht 5' 3.5" (1.613 m)   Wt 181 lb (82.1 kg)   LMP 07/23/2013   BMI 31.56 kg/m?   ? ? ?Physical Exam ?Vitals and nursing note reviewed.  ?HENT:  ?   Head: Normocephalic and atraumatic.  ?Eyes:  ?   Extraocular Movements: Extraocular movements intact.  ?Pulmonary:  ?   Effort: Pulmonary effort is normal.  ?Musculoskeletal:  ?   Cervical back: Normal range of motion.  ?Neurological:  ?   Mental Status: She is alert and oriented to person, place, and time.  ?Psychiatric:     ?   Mood and Affect: Affect normal.  ? ?ASSESSMENT & PLAN:   ? ?1. Anxiety ?Comments: She will c/w citalopram '20mg'$  daily. She will f/u in July 2023 for yearly physical exam.  ? ?2. Work stress ?Comments: She is encouraged to incorporate "me" time into her daily routine.  ? ? ?COVID-19 Education: ?The signs and symptoms of COVID-19 were discussed with the patient and how to seek care for testing (follow up with PCP or arrange E-visit).  The importance of social distancing was discussed  today. ? ?Patient Risk:   ?After full review of this patients clinical status, I feel that they are at least moderate risk at this time. ? ?Time:   ?Today, I have spent 13 minutes/ seconds with the patient with telehealth technology discussing above diagnoses.   ? ? ?Medication Adjustments/Labs and Tests Ordered: ?Current medicines are reviewed at length with the patient today.  Concerns regarding medicines are outlined above.  ? ?Tests Ordered: ?No orders of the defined types were placed in this encounter. ? ? ?Medication Changes: ?No orders of the defined types were placed in this encounter. ? ? ?Disposition:  Follow up prn ? ?Signed, ?Maximino Greenland, MD  ?  ?

## 2022-02-14 NOTE — Patient Instructions (Signed)
Managing Stress, Adult ?Feeling a certain amount of stress is normal. Stress helps our body and mind get ready to deal with the demands of life. Stress hormones can motivate you to do well at work and meet your responsibilities. But severe or long-term (chronic) stress can affect your mental and physical health. Chronic stress puts you at higher risk for: ?Anxiety and depression. ?Other health problems such as digestive problems, muscle aches, heart disease, high blood pressure, and stroke. ?What are the causes? ?Common causes of stress include: ?Demands from work, such as deadlines, feeling overworked, or having long hours. ?Pressures at home, such as money issues, disagreements with a spouse, or parenting issues. ?Pressures from major life changes, such as divorce, moving, loss of a loved one, or chronic illness. ?You may be at higher risk for stress-related problems if you: ?Do not get enough sleep. ?Are in poor health. ?Do not have emotional support. ?Have a mental health disorder such as anxiety or depression. ?How to recognize stress ?Stress can make you: ?Have trouble sleeping. ?Feel sad, anxious, irritable, or overwhelmed. ?Lose your appetite. ?Overeat or want to eat unhealthy foods. ?Want to use drugs or alcohol. ?Stress can also cause physical symptoms, such as: ?Sore, tense muscles, especially in the shoulders and neck. ?Headaches. ?Trouble breathing. ?A faster heart rate. ?Stomach pain, nausea, or vomiting. ?Diarrhea or constipation. ?Trouble concentrating. ?Follow these instructions at home: ?Eating and drinking ?Eat a healthy diet. This includes: ?Eating foods that are high in fiber, such as beans, whole grains, and fresh fruits and vegetables. ?Limiting foods that are high in fat and processed sugars, such as fried or sweet foods. ?Do not skip meals or overeat. ?Drink enough fluid to keep your urine pale yellow. ?Alcohol use ?Do not drink alcohol if: ?Your health care provider tells you not to  drink. ?You are pregnant, may be pregnant, or are planning to become pregnant. ?Drinking alcohol is a way some people try to ease their stress. This can be dangerous, so if you drink alcohol: ?Limit how much you have to: ?0-1 drink a day for women. ?0-2 drinks a day for men. ?Know how much alcohol is in your drink. In the [place] one drink equals one 12 oz bottle of beer (355 mL), one 5 oz glass of wine (148 mL), or one 1? oz glass of hard liquor (44 mL). ?Activity ? ?Include 30 minutes of exercise in your daily schedule. Exercise is a good stress reducer. ?Include time in your day for an activity that you find relaxing. Try taking a walk, going on a bike ride, reading a book, or listening to music. ?Schedule your time in a way that lowers stress, and keep a regular schedule. Focus on doing what is most important to get done. ?Lifestyle ?Identify the source of your stress and your reaction to it. See a therapist who can help you change unhelpful reactions. ?When there are stressful events: ?Talk about them with family, friends, or coworkers. ?Try to think realistically about stressful events and not ignore them or overreact. ?Try to find the positives in a stressful situation and not focus on the negatives. ?Cut back on responsibilities at work and home, if possible. Ask for help from friends or family members if you need it. ?Find ways to manage stress, such as: ?Mindfulness, meditation, or deep breathing. ?Yoga or tai chi. ?Progressive muscle relaxation. ?Spending time in nature. ?Doing art, playing music, or reading. ?Making time for fun activities. ?Spending time with family and friends. ?Get support   from family, friends, or spiritual resources. General instructions Get enough sleep. Try to go to sleep and get up at about the same time every day. Take over-the-counter and prescription medicines only as told by your health care provider. Do not use any products that contain nicotine or tobacco. These products  include cigarettes, chewing tobacco, and vaping devices, such as e-cigarettes. If you need help quitting, ask your health care provider. Do not use drugs or smoke to deal with stress. Keep all follow-up visits. This is important. Where to find support Talk with your health care provider about stress management or finding a support group. Find a therapist to work with you on your stress management techniques. Where to find more information National Alliance on Mental Illness: www.nami.org American Psychological Association: www.apa.org Contact a health care provider if: Your stress symptoms get worse. You are unable to manage your stress at home. You are struggling to stop using drugs or alcohol. Get help right away if: You may be a danger to yourself or others. You have any thoughts of death or suicide. Get help right awayif you feel like you may hurt yourself or others, or have thoughts about taking your own life. Go to your nearest emergency room or: Call 911. Call the National Suicide Prevention Lifeline at 1-800-273-8255 or 988 in the [place] This is open 24 hours a day. Text the Crisis Text Line at 741741. Summary Feeling a certain amount of stress is normal, but severe or long-term (chronic) stress can affect your mental and physical health. Chronic stress can put you at higher risk for anxiety, depression, and other health problems such as digestive problems, muscle aches, heart disease, high blood pressure, and stroke. You may be at higher risk for stress-related problems if you do not get enough sleep, are in poor health, lack emotional support, or have a mental health disorder such as anxiety or depression. Identify the source of your stress and your reaction to it. Try talking about stressful events with family, friends, or coworkers, finding a coping method, or getting support from spiritual resources. If you need more help, talk with your health care provider about finding a  support group or a mental health therapist. This information is not intended to replace advice given to you by your health care provider. Make sure you discuss any questions you have with your health care provider. Document Revised: 04/19/2021 Document Reviewed: 04/17/2021 Elsevier Patient Education  2023 [employer]  

## 2022-02-15 DIAGNOSIS — H524 Presbyopia: Secondary | ICD-10-CM | POA: Diagnosis not present

## 2022-02-19 DIAGNOSIS — Z6831 Body mass index (BMI) 31.0-31.9, adult: Secondary | ICD-10-CM | POA: Diagnosis not present

## 2022-02-19 DIAGNOSIS — H57813 Brow ptosis, bilateral: Secondary | ICD-10-CM | POA: Diagnosis not present

## 2022-02-19 DIAGNOSIS — I7300 Raynaud's syndrome without gangrene: Secondary | ICD-10-CM | POA: Insufficient documentation

## 2022-02-19 DIAGNOSIS — L718 Other rosacea: Secondary | ICD-10-CM | POA: Diagnosis not present

## 2022-02-19 DIAGNOSIS — H02834 Dermatochalasis of left upper eyelid: Secondary | ICD-10-CM | POA: Diagnosis not present

## 2022-02-19 DIAGNOSIS — H02831 Dermatochalasis of right upper eyelid: Secondary | ICD-10-CM | POA: Diagnosis not present

## 2022-02-19 DIAGNOSIS — K219 Gastro-esophageal reflux disease without esophagitis: Secondary | ICD-10-CM | POA: Diagnosis not present

## 2022-02-19 DIAGNOSIS — R234 Changes in skin texture: Secondary | ICD-10-CM | POA: Diagnosis not present

## 2022-02-22 ENCOUNTER — Other Ambulatory Visit: Payer: Self-pay | Admitting: Internal Medicine

## 2022-02-22 DIAGNOSIS — Z1231 Encounter for screening mammogram for malignant neoplasm of breast: Secondary | ICD-10-CM

## 2022-02-27 ENCOUNTER — Other Ambulatory Visit (HOSPITAL_COMMUNITY): Payer: Self-pay

## 2022-02-28 NOTE — Progress Notes (Signed)
Office Visit Note  Patient: Victoria Herman             Date of Birth: 09/22/1961           MRN: [id number]             PCP: Glendale Chard, MD Referring: Glendale Chard, MD Visit Date: 03/13/2022 Occupation: '@GUAROCC'$ @  Subjective:  Discussed x-ray results and joint stiffness  History of Present Illness: Victoria Herman is a 61 y.o. female with history of osteoarthritis and Raynauds.  She states the Raynaud's symptoms improved during the winter months while she was taking amlodipine.  She stopped amlodipine as the weather got warmer.  She continues to have some stiffness in her hands but has not noticed any joint swelling.  She notes intermittent pain in the left trochanteric bursa.  She noted improvement in the coccyx pain after using coccyx cushion.  She denies any history of oral ulcers, nasal ulcers, malar rash, skin tightness, photosensitivity or inflammatory arthritis.  She states she has gained some weight recently.  She also had recent bone density which she would like to discuss today.  Activities of Daily Living:  Patient reports morning stiffness for 30 minutes.   Patient Denies nocturnal pain.  Difficulty dressing/grooming: Denies Difficulty climbing stairs: Denies Difficulty getting out of chair: Denies Difficulty using hands for taps, buttons, cutlery, and/or writing: Denies  Review of Systems  Constitutional:  Negative for fatigue.  HENT:  Positive for mouth dryness.   Eyes:  Negative for dryness.  Respiratory:  Negative for shortness of breath.   Cardiovascular:  Negative for swelling in legs/feet.  Gastrointestinal:  Positive for constipation.  Endocrine: Positive for heat intolerance.  Genitourinary:  Negative for difficulty urinating.  Musculoskeletal:  Positive for morning stiffness.  Skin:  Negative for rash.  Allergic/Immunologic: Negative for susceptible to infections.  Neurological:  Negative for numbness.  Hematological:  Negative for bruising/bleeding  tendency.  Psychiatric/Behavioral:  Negative for sleep disturbance.    PMFS History:  Patient Active Problem List   Diagnosis Date Noted   Raynaud's phenomenon without gangrene 03/13/2022   Primary osteoarthritis of both hands 03/13/2022   Stress incontinence in female 04/10/2021   Abnormal serum iron level 04/10/2021   Anxiety 04/10/2021   Class 2 obesity due to excess calories without serious comorbidity with body mass index (BMI) of 36.0 to 36.9 in adult 04/10/2021   Superficial thrombophlebitis 03/21/2014    Past Medical History:  Diagnosis Date   Allergy    Anxiety    GERD (gastroesophageal reflux disease)    Hyperlipidemia    Seasonal allergies    Superficial thrombophlebitis 03-2014, 07-2014    Family History  Problem Relation Age of Onset   Hypertension Mother    Diabetes Mother    Varicose Veins Mother    Varicose Veins Father    Cancer Father    Hypertension Sister    Diabetes Sister    Thyroid disease Sister    Varicose Veins Brother    Hypertension Brother    Hypertension Brother    Varicose Veins Brother    Healthy Daughter    Migraines Daughter    Colon cancer Neg Hx    Esophageal cancer Neg Hx    Rectal cancer Neg Hx    Stomach cancer Neg Hx    Breast cancer Neg Hx    Past Surgical History:  Procedure Laterality Date   COLONOSCOPY     ORIF WRIST FRACTURE Left 04/12/2020   Procedure:  OPEN REDUCTION INTERNAL FIXATION (ORIF) WRIST FRACTURE and repair reconstruction as indicated;  Surgeon: Iran Planas, MD;  Location: [facility];  Service: Orthopedics;  Laterality: Left;  with IV sedation  Needs 2 hours   WISDOM TOOTH EXTRACTION     Social History   Social History Narrative   Not on file   Immunization History  Administered Date(s) Administered   Influenza,inj,Quad PF,6+ Mos 07/16/2019, 07/28/2020, 07/20/2021   Influenza-Unspecified 07/17/2017   PFIZER(Purple Top)SARS-COV-2 Vaccination 09/30/2019, 10/20/2019, 04/05/2020, 07/21/2020   Zoster  Recombinat (Shingrix) 05/12/2018, 07/20/2018     Objective: Vital Signs: BP 134/79 (BP Location: Left Arm, Patient Position: Sitting, Cuff Size: Small)   Pulse 72   Resp 12   Ht '5\' 4"'$  (1.626 m)   Wt 189 lb (85.7 kg)   LMP 07/23/2013   BMI 32.44 kg/m    Physical Exam Vitals and nursing note reviewed.  Constitutional:      Appearance: She is well-developed.  HENT:     Head: Normocephalic and atraumatic.  Eyes:     Conjunctiva/sclera: Conjunctivae normal.  Cardiovascular:     Rate and Rhythm: Normal rate and regular rhythm.     Heart sounds: Normal heart sounds.  Pulmonary:     Effort: Pulmonary effort is normal.     Breath sounds: Normal breath sounds.  Abdominal:     General: Bowel sounds are normal.     Palpations: Abdomen is soft.  Musculoskeletal:     Cervical back: Normal range of motion.  Lymphadenopathy:     Cervical: No cervical adenopathy.  Skin:    General: Skin is warm and dry.     Capillary Refill: Capillary refill takes less than 2 seconds.  Neurological:     Mental Status: She is alert and oriented to person, place, and time.  Psychiatric:        Behavior: Behavior normal.    Musculoskeletal Exam: C-spine was in good range of motion.  Shoulder joints, and elbow joints were in good range of motion.  She had limited extension in her left wrist due to prior fracture.  Right wrist joint was in good range of motion.  There was no synovitis or synovial thickening over MCPs PIPs or DIPs.  Hip joints and knee joints were in good range of motion.  She had mild tenderness over left trochanteric bursa.  There was no tenderness over ankles or MTPs.  CDAI Exam: CDAI Score: -- Patient Global: --; Provider Global: -- Swollen: --; Tender: -- Joint Exam 03/13/2022   No joint exam has been documented for this visit   There is currently no information documented on the homunculus. Go to the Rheumatology activity and complete the homunculus joint  exam.  Investigation: No additional findings.  Imaging: DG Bone Density  Result Date: 03/12/2022 EXAM: DUAL X-RAY ABSORPTIOMETRY (DXA) FOR BONE MINERAL DENSITY IMPRESSION: Referring Physician:  Glendale Chard Your patient completed a bone mineral density test using GE Lunar iDXA system (analysis version: 16). Technologist: [place] PATIENT: Name: Margaretmary, Prisk Patient ID: [id number] Birth Date: 30-Jun-1961 Height: 64.0 in. Sex: Female Measured: 03/12/2022 Weight: 186.4 lbs. Indications: Caucasian, Celexa, Estrogen Deficient, Family History of Osteoporosis, History of Osteopenia, Postmenopausal, History of Fracture (Adult) (V15.51) Fractures: Left Wrist Treatments: Calcium (E943.0), Vitamin D (E933.5) ASSESSMENT: The BMD measured at [facility] L1-L3 is 0.994 g/cm2 with a T-score of -1.5. This patient is considered osteopenic/low bone mass according to [facility]) criteria. The quality of the exam is good. L4 was excluded due  to degenerative changes. Site Region Measured Date Measured Age YA BMD Significant CHANGE T-score [facility] L1-L3 03/12/2022 59.5 -1.5 0.994 g/cm2 * [facility] Sp[facility] L1-L3 11/16/2019 57.2 -1.9 0.951 g/cm2 * DualFemur Neck Right 03/12/2022 59.5 -1.2 0.875 g/cm2 * DualFemur Neck Right 11/16/2019 57.2 -1.6 0.812 g/cm2 * DualFemur Total Mean 03/12/2022 59.5 -0.7 0.916 g/cm2 * DualFemur Total Mean 11/16/2019 57.2 -1.0 0.877 g/cm2 * World Health Organization [facility]) criteria for post-menopausal, Caucasian Women: Normal       T-score at or above -1 SD Osteopenia   T-score between -1 and -2.5 SD Osteoporosis T-score at or below -2.5 SD RECOMMENDATION: 1. All patients should optimize calcium and vitamin D intake. 2. Consider FDA-approved medical therapies in postmenopausal women and men aged 29 years and older, based on the following: a. A hip or vertebral (clinical or morphometric) fracture. b. T-score = -2.5 at the femoral neck or spine after appropriate evaluation to exclude secondary causes. c.  Low bone mass (T-score between -1.0 and -2.5 at the femoral neck or spine) and a 10-year probability of a hip fracture = 3% or a 10-year probability of a major osteoporosis-related fracture = 20% based on the [place]-adapted WHO algorithm. d. Clinician judgment and/or patient preferences may indicate treatment for people with 10-year fracture probabilities above or below these levels. FOLLOW-UP: Patients with diagnosis of osteoporosis or at high risk for fracture should have regular bone mineral density tests.? Patients eligible for Medicare are allowed routine testing every 2 years.? The testing frequency can be increased to one year for patients who have rapidly progressing disease, are receiving or discontinuing medical therapy to restore bone mass, or have additional risk factors. I have reviewed this study and agree with the findings. [facility] FRAX* 10-year Probability of Fracture Based on femoral neck BMD: DualFemur (Right) Major Osteoporotic Fracture: 11.8% Hip Fracture:                0.8% Population:                  [place] (Caucasian) Risk Factors:                History of Fracture (Adult) (V15.51) *FRAX is a Materials engineer of the [employer] for Metabolic Bone Disease, a [place] (WHO) [facility]. ASSESSMENT: The probability of a major osteoporotic fracture is 11.8% within the next ten years. The probability of a hip fracture is 0.8% within the next ten years. Electronically Signed   By: Elmer Picker M.D.   On: 03/12/2022 14:16    Recent Labs: Lab Results  Component Value Date   WBC 7.8 04/16/2021   HGB 13.5 04/16/2021   PLT 212 04/16/2021   NA 137 04/16/2021   K 3.9 04/16/2021   CL 102 04/16/2021   CO2 26 (A) 04/16/2021   GLUCOSE 95 04/03/2020   BUN 13 04/16/2021   CREATININE 0.7 04/16/2021   BILITOT 0.3 04/03/2020   ALKPHOS 87 04/16/2021   AST 17 04/16/2021   ALT 15 10/16/2021   PROT 6.5 04/03/2020   ALBUMIN  4.3 04/16/2021   CALCIUM 9.0 04/16/2021   GFRAA 109 04/16/2021    Speciality Comments: No specialty comments available.  Procedures:  No procedures performed Allergies: E-mycin [erythromycin] and Sulfa antibiotics   Assessment / Plan:     Visit Diagnoses: Raynaud's phenomenon without gangrene - History of severe Raynauds since in her 82s.  She has not good response to Norvasc during the winter months.  Her Raynaud's symptoms have improved during the warmer weather .  She discontinued Norvasc.  No sclerodactyly, nailbed capillary changes or Telengectesia's were noted.  Primary osteoarthritis of both hands-she has some joint to stiffness in her hands.  No synovial thickening or synovitis was noted.  Joint protection muscle strengthening was discussed.  A handout on hand exercises was given.  S/P ORIF (open reduction internal fixation) fracture - Left wrist July 2021.  She did well after PT and OT.  She has limited extension in the left wrist joint.  No synovitis was noted.  Trochanteric bursitis, left hip-she has intermittent discomfort.  A handout on IT band stretches was given.  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  Osteopenia of multiple sites - February 9, 2021The BMD measured at [place] is 0.962 g/cm2 with a T-score of-1.9.06/07/23The BMD measured at [facility] L1-L3 is 0.994 g/cm2 with a T-score of -1.5.  DEXA scan findings were reviewed.  Her BMD has improved overall at all sites.  She has been exercising on a regular basis.  Use of calcium rich diet and vitamin D was discussed.  History of anxiety-she is on Celexa which helps.  Stress incontinence in female  Orders: No orders of the defined types were placed in this encounter.  No orders of the defined types were placed in this encounter.    Follow-Up Instructions: Return in about 1 year (around 03/14/2023) for Osteoarthritis.   Bo Merino, MD  Note - This record has been created using Radio producer.  Chart creation errors have been sought, but may not always  have been located. Such creation errors do not reflect on  the standard of medical care.

## 2022-03-07 DIAGNOSIS — J3089 Other allergic rhinitis: Secondary | ICD-10-CM | POA: Diagnosis not present

## 2022-03-12 ENCOUNTER — Ambulatory Visit
Admission: RE | Admit: 2022-03-12 | Discharge: 2022-03-12 | Disposition: A | Discharge status: Home / Self Care | Payer: 59 | Source: Ambulatory Visit | Attending: Internal Medicine | Admitting: Internal Medicine

## 2022-03-12 DIAGNOSIS — M8589 Other specified disorders of bone density and structure, multiple sites: Secondary | ICD-10-CM | POA: Diagnosis not present

## 2022-03-12 DIAGNOSIS — E2839 Other primary ovarian failure: Secondary | ICD-10-CM

## 2022-03-12 DIAGNOSIS — Z780 Asymptomatic menopausal state: Secondary | ICD-10-CM | POA: Diagnosis not present

## 2022-03-13 ENCOUNTER — Encounter: Payer: Self-pay | Admitting: Rheumatology

## 2022-03-13 ENCOUNTER — Ambulatory Visit (INDEPENDENT_AMBULATORY_CARE_PROVIDER_SITE_OTHER): Payer: 59 | Admitting: Rheumatology

## 2022-03-13 VITALS — BP 134/79 | HR 72 | Resp 12 | Ht 64.0 in | Wt 189.0 lb

## 2022-03-13 DIAGNOSIS — N393 Stress incontinence (female) (male): Secondary | ICD-10-CM | POA: Diagnosis not present

## 2022-03-13 DIAGNOSIS — M8589 Other specified disorders of bone density and structure, multiple sites: Secondary | ICD-10-CM | POA: Diagnosis not present

## 2022-03-13 DIAGNOSIS — Z9889 Other specified postprocedural states: Secondary | ICD-10-CM

## 2022-03-13 DIAGNOSIS — M19041 Primary osteoarthritis, right hand: Secondary | ICD-10-CM

## 2022-03-13 DIAGNOSIS — M7062 Trochanteric bursitis, left hip: Secondary | ICD-10-CM | POA: Diagnosis not present

## 2022-03-13 DIAGNOSIS — I7300 Raynaud's syndrome without gangrene: Secondary | ICD-10-CM

## 2022-03-13 DIAGNOSIS — Z8659 Personal history of other mental and behavioral disorders: Secondary | ICD-10-CM

## 2022-03-13 DIAGNOSIS — M533 Sacrococcygeal disorders, not elsewhere classified: Secondary | ICD-10-CM

## 2022-03-13 DIAGNOSIS — Z8781 Personal history of (healed) traumatic fracture: Secondary | ICD-10-CM

## 2022-03-13 DIAGNOSIS — M19042 Primary osteoarthritis, left hand: Secondary | ICD-10-CM

## 2022-03-13 NOTE — Patient Instructions (Signed)
Hand Exercises Hand exercises can be helpful for almost anyone. These exercises can strengthen the hands, improve flexibility and movement, and increase blood flow to the hands. These results can make work and daily tasks easier. Hand exercises can be especially helpful for people who have joint pain from arthritis or have nerve damage from overuse (carpal tunnel syndrome). These exercises can also help people who have injured a hand. Exercises Most of these hand exercises are gentle stretching and motion exercises. It is usually safe to do them often throughout the day. Warming up your hands before exercise may help to reduce stiffness. You can do this with gentle massage or by placing your hands in warm water for 10-15 minutes. It is normal to feel some stretching, pulling, tightness, or mild discomfort as you begin new exercises. This will gradually improve. Stop an exercise right away if you feel sudden, severe pain or your pain gets worse. Ask your health care provider which exercises are best for you. Knuckle bend or "claw" fist  Stand or sit with your arm, hand, and all five fingers pointed straight up. Make sure to keep your wrist straight during the exercise. Gently bend your fingers down toward your palm until the tips of your fingers are touching the top of your palm. Keep your big knuckle straight and just bend the small knuckles in your fingers. Hold this position for __________ seconds. Straighten (extend) your fingers back to the starting position. Repeat this exercise 5-10 times with each hand. Full finger fist  Stand or sit with your arm, hand, and all five fingers pointed straight up. Make sure to keep your wrist straight during the exercise. Gently bend your fingers into your palm until the tips of your fingers are touching the middle of your palm. Hold this position for __________ seconds. Extend your fingers back to the starting position, stretching every joint fully. Repeat  this exercise 5-10 times with each hand. Straight fist Stand or sit with your arm, hand, and all five fingers pointed straight up. Make sure to keep your wrist straight during the exercise. Gently bend your fingers at the big knuckle, where your fingers meet your hand, and the middle knuckle. Keep the knuckle at the tips of your fingers straight and try to touch the bottom of your palm. Hold this position for __________ seconds. Extend your fingers back to the starting position, stretching every joint fully. Repeat this exercise 5-10 times with each hand. Tabletop  Stand or sit with your arm, hand, and all five fingers pointed straight up. Make sure to keep your wrist straight during the exercise. Gently bend your fingers at the big knuckle, where your fingers meet your hand, as far down as you can while keeping the small knuckles in your fingers straight. Think of forming a tabletop with your fingers. Hold this position for __________ seconds. Extend your fingers back to the starting position, stretching every joint fully. Repeat this exercise 5-10 times with each hand. Finger spread  Place your hand flat on a table with your palm facing down. Make sure your wrist stays straight as you do this exercise. Spread your fingers and thumb apart from each other as far as you can until you feel a gentle stretch. Hold this position for __________ seconds. Bring your fingers and thumb tight together again. Hold this position for __________ seconds. Repeat this exercise 5-10 times with each hand. Making circles  Stand or sit with your arm, hand, and all five fingers pointed   straight up. Make sure to keep your wrist straight during the exercise. Make a circle by touching the tip of your thumb to the tip of your index finger. Hold for __________ seconds. Then open your hand wide. Repeat this motion with your thumb and each finger on your hand. Repeat this exercise 5-10 times with each hand. Thumb  motion  Sit with your forearm resting on a table and your wrist straight. Your thumb should be facing up toward the ceiling. Keep your fingers relaxed as you move your thumb. Lift your thumb up as high as you can toward the ceiling. Hold for __________ seconds. Bend your thumb across your palm as far as you can, reaching the tip of your thumb for the small finger (pinkie) side of your palm. Hold for __________ seconds. Repeat this exercise 5-10 times with each hand. Grip strengthening  Hold a stress ball or other soft ball in the middle of your hand. Slowly increase the pressure, squeezing the ball as much as you can without causing pain. Think of bringing the tips of your fingers into the middle of your palm. All of your finger joints should bend when doing this exercise. Hold your squeeze for __________ seconds, then relax. Repeat this exercise 5-10 times with each hand. Contact a health care provider if: Your hand pain or discomfort gets much worse when you do an exercise. Your hand pain or discomfort does not improve within 2 hours after you exercise. If you have any of these problems, stop doing these exercises right away. Do not do them again unless your health care provider says that you can. Get help right away if: You develop sudden, severe hand pain or swelling. If this happens, stop doing these exercises right away. Do not do them again unless your health care provider says that you can. This information is not intended to replace advice given to you by your health care provider. Make sure you discuss any questions you have with your health care provider. Document Revised: 01/11/2021 Document Reviewed: 01/11/2021 Elsevier Patient Education  2023 Else[employer] Inc. Iliotibial Band Syndrome Rehab Ask your health care provider which exercises are safe for you. Do exercises exactly as told by your health care provider and adjust them as directed. It is normal to feel mild stretching,  pulling, tightness, or discomfort as you do these exercises. Stop right away if you feel sudden pain or your pain gets significantly worse. Do not begin these exercises until told by your health care provider. Stretching and range-of-motion exercises These exercises warm up your muscles and joints and improve the movement and flexibility of your hip and pelvis. Quadriceps stretch, prone  Lie on your abdomen (prone position) on a firm surface, such as a bed or padded floor. Bend your left / right knee and reach back to hold your ankle or pant leg. If you cannot reach your ankle or pant leg, loop a belt around your foot and grab the belt instead. Gently pull your heel toward your buttocks. Your knee should not slide out to the side. You should feel a stretch in the front of your thigh and knee (quadriceps). Hold this position for __________ seconds. Repeat __________ times. Complete this exercise __________ times a day. Iliotibial band stretch An iliotibial band is a strong band of muscle tissue that runs from the outer side of your hip to the outer side of your thigh and knee. Lie on your side with your left / right leg in the top   position. Bend both of your knees and grab your left / right ankle. Stretch out your bottom arm to help you balance. Slowly bring your top knee back so your thigh goes behind your trunk. Slowly lower your top leg toward the floor until you feel a gentle stretch on the outside of your left / right hip and thigh. If you do not feel a stretch and your knee will not fall farther, place the heel of your other foot on top of your knee and pull your knee down toward the floor with your foot. Hold this position for __________ seconds. Repeat __________ times. Complete this exercise __________ times a day. Strengthening exercises These exercises build strength and endurance in your hip and pelvis. Endurance is the ability to use your muscles for a long time, even after they get  tired. Straight leg raises, side-lying This exercise strengthens the muscles that rotate the leg at the hip and move it away from your body (hip abductors). Lie on your side with your left / right leg in the top position. Lie so your head, shoulder, hip, and knee line up. You may bend your bottom knee to help you balance. Roll your hips slightly forward so your hips are stacked directly over each other and your left / right knee is facing forward. Tense the muscles in your outer thigh and lift your top leg 4-6 inches (10-15 cm). Hold this position for __________ seconds. Slowly lower your leg to return to the starting position. Let your muscles relax completely before doing another repetition. Repeat __________ times. Complete this exercise __________ times a day. Leg raises, prone This exercise strengthens the muscles that move the hips backward (hip extensors). Lie on your abdomen (prone position) on your bed or a firm surface. You can put a pillow under your hips if that is more comfortable for your lower back. Bend your left / right knee so your foot is straight up in the air. Squeeze your buttocks muscles and lift your left / right thigh off the bed. Do not let your back arch. Tense your thigh muscle as hard as you can without increasing any knee pain. Hold this position for __________ seconds. Slowly lower your leg to return to the starting position and allow it to relax completely. Repeat __________ times. Complete this exercise __________ times a day. Hip hike Stand sideways on a bottom step. Stand on your left / right leg with your other foot unsupported next to the step. You can hold on to a railing or wall for balance if needed. Keep your knees straight and your torso square. Then lift your left / right hip up toward the ceiling. Slowly let your left / right hip lower toward the floor, past the starting position. Your foot should get closer to the floor. Do not lean or bend your  knees. Repeat __________ times. Complete this exercise __________ times a day. This information is not intended to replace advice given to you by your health care provider. Make sure you discuss any questions you have with your health care provider. Document Revised: 12/01/2019 Document Reviewed: 12/01/2019 Elsevier Patient Education  2023 Elsevier Inc.  

## 2022-03-20 DIAGNOSIS — J3089 Other allergic rhinitis: Secondary | ICD-10-CM | POA: Diagnosis not present

## 2022-04-17 ENCOUNTER — Encounter: Payer: Self-pay | Admitting: Internal Medicine

## 2022-04-17 ENCOUNTER — Ambulatory Visit (INDEPENDENT_AMBULATORY_CARE_PROVIDER_SITE_OTHER): Payer: 59 | Admitting: Internal Medicine

## 2022-04-17 ENCOUNTER — Other Ambulatory Visit (HOSPITAL_COMMUNITY)
Admission: RE | Admit: 2022-04-17 | Discharge: 2022-04-17 | Disposition: A | Discharge status: Home / Self Care | Source: Ambulatory Visit | Attending: Internal Medicine | Admitting: Internal Medicine

## 2022-04-17 VITALS — BP 112/64 | HR 60 | Temp 97.6°F | Ht 64.0 in | Wt 190.0 lb

## 2022-04-17 DIAGNOSIS — F419 Anxiety disorder, unspecified: Secondary | ICD-10-CM | POA: Diagnosis not present

## 2022-04-17 DIAGNOSIS — Z01419 Encounter for gynecological examination (general) (routine) without abnormal findings: Secondary | ICD-10-CM | POA: Insufficient documentation

## 2022-04-17 DIAGNOSIS — E6609 Other obesity due to excess calories: Secondary | ICD-10-CM | POA: Diagnosis not present

## 2022-04-17 DIAGNOSIS — Z6832 Body mass index (BMI) 32.0-32.9, adult: Secondary | ICD-10-CM

## 2022-04-17 DIAGNOSIS — Z0000 Encounter for general adult medical examination without abnormal findings: Secondary | ICD-10-CM

## 2022-04-17 DIAGNOSIS — E7800 Pure hypercholesterolemia, unspecified: Secondary | ICD-10-CM | POA: Diagnosis not present

## 2022-04-17 NOTE — Patient Instructions (Signed)

## 2022-04-17 NOTE — Progress Notes (Signed)
I,Tianna Badgett,acting as a Education administrator for Maximino Greenland, MD.,have documented all relevant documentation on the behalf of Maximino Greenland, MD,as directed by  Maximino Greenland, MD while in the presence of Maximino Greenland, MD.  Subjective:     Patient ID: Victoria Herman , female    DOB: 03-Apr-1961 , 61 y.o.   MRN: [id number]   Chief Complaint  Patient presents with   Annual Exam    HPI  The patient is here for a physical examination.  Last pap was June 2020. She has no specific concerns or complaints at this time. She is going to [facility] for assistance with weight management.      Past Medical History:  Diagnosis Date   Allergy    Anxiety    GERD (gastroesophageal reflux disease)    Hyperlipidemia    Seasonal allergies    Superficial thrombophlebitis 03-2014, 07-2014     Family History  Problem Relation Age of Onset   Hypertension Mother    Diabetes Mother    Varicose Veins Mother    Varicose Veins Father    Cancer Father    Hypertension Sister    Diabetes Sister    Thyroid disease Sister    Varicose Veins Brother    Hypertension Brother    Hypertension Brother    Varicose Veins Brother    Healthy Daughter    Migraines Daughter    Colon cancer Neg Hx    Esophageal cancer Neg Hx    Rectal cancer Neg Hx    Stomach cancer Neg Hx    Breast cancer Neg Hx      Current Outpatient Medications:    amLODipine (NORVASC) 5 MG tablet, Take 1 tablet (5 mg total) by mouth daily., Disp: 30 tablet, Rfl: 2   atorvastatin (LIPITOR) 20 MG tablet, Take 1 tablet (20 mg total) by mouth daily Monday through Saturday, skip Sunday, Disp: 90 tablet, Rfl: 2   Barberry-Oreg Grape-Goldenseal (BERBERINE COMPLEX PO), Take 2 tablets by mouth daily., Disp: , Rfl:    Calcium Carb-Cholecalciferol (CALCIUM 600 + D PO), Take 1 tablet by mouth daily. Vit D 12.5, Disp: , Rfl:    cetirizine (ZYRTEC) 10 MG tablet, Take 10 mg by mouth daily., Disp: , Rfl:    Cholecalciferol (VITAMIN D3 SUPER STRENGTH) 50  MCG (2000 UT) CAPS, Take 2,000 Units by mouth daily., Disp: , Rfl:    citalopram (CELEXA) 20 MG tablet, Take 1 tablet (20 mg total) by mouth daily., Disp: 90 tablet, Rfl: 2   EPINEPHrine 0.3 mg/0.3 mL IJ SOAJ injection, Inject 0.3 mg into the skin once as needed., Disp: , Rfl:    hydrocortisone 2.5 % cream, Apply topically 2 (two) times daily., Disp: 30 g, Rfl: 0   mometasone (NASONEX) 50 MCG/ACT nasal spray, Use 2 sprays in each nostril once daily, Disp: 17 g, Rfl: 5   Allergies  Allergen Reactions   E-Mycin [Erythromycin] Diarrhea   Sulfa Antibiotics Rash      The patient states she uses none for birth control. Last LMP was Patient's last menstrual period was 07/23/2013.. Negative for Dysmenorrhea. Negative for: breast discharge, breast lump(s), breast pain and breast self exam. Associated symptoms include abnormal vaginal bleeding. Pertinent negatives include abnormal bleeding (hematology), anxiety, decreased libido, depression, difficulty falling sleep, dyspareunia, history of infertility, nocturia, sexual dysfunction, sleep disturbances, urinary incontinence, urinary urgency, vaginal discharge and vaginal itching. Diet regular.The patient states her exercise level is  intermittent.  . The patient's tobacco use is:  Social History   Tobacco Use  Smoking Status Never   Passive exposure: Past  Smokeless Tobacco Never  . She has been exposed to passive smoke. The patient's alcohol use is:  Social History   Substance and Sexual Activity  Alcohol Use Yes   Comment: occas.    Review of Systems  Constitutional: Negative.   HENT: Negative.    Eyes: Negative.   Respiratory: Negative.    Cardiovascular: Negative.   Gastrointestinal: Negative.   Endocrine: Negative.   Genitourinary: Negative.   Musculoskeletal: Negative.   Skin: Negative.   Allergic/Immunologic: Negative.   Neurological: Negative.   Hematological: Negative.   Psychiatric/Behavioral: Negative.       Today's  Vitals   04/17/22 1155  BP: 112/64  Pulse: 60  Temp: 97.6 F (36.4 C)  TempSrc: Oral  Weight: 190 lb (86.2 kg)  Height: '5\' 4"'  (1.626 m)   Body mass index is 32.61 kg/m.  Wt Readings from Last 3 Encounters:  04/17/22 190 lb (86.2 kg)  03/13/22 189 lb (85.7 kg)  02/14/22 181 lb (82.1 kg)    Objective:  Physical Exam Vitals and nursing note reviewed. Exam conducted with a chaperone present.  Constitutional:      Appearance: Normal appearance.  HENT:     Head: Normocephalic and atraumatic.     Right Ear: Tympanic membrane, ear canal and external ear normal.     Left Ear: Tympanic membrane, ear canal and external ear normal.     Nose: Nose normal.     Mouth/Throat:     Mouth: Mucous membranes are moist.     Pharynx: Oropharynx is clear.  Eyes:     Extraocular Movements: Extraocular movements intact.     Conjunctiva/sclera: Conjunctivae normal.     Pupils: Pupils are equal, round, and reactive to light.  Cardiovascular:     Rate and Rhythm: Normal rate and regular rhythm.     Pulses: Normal pulses.     Heart sounds: Normal heart sounds.     Comments: Varicose veins b/l LE Pulmonary:     Effort: Pulmonary effort is normal.     Breath sounds: Normal breath sounds.  Abdominal:     General: Abdomen is flat. Bowel sounds are normal.     Palpations: Abdomen is soft.  Genitourinary:    General: Normal vulva.     Exam position: Lithotomy position.     Tanner stage (genital): 5.     Vagina: Normal.     Cervix: Normal.     Uterus: Normal.      Rectum: Normal. Guaiac result negative.     Comments: deferred Musculoskeletal:        General: Normal range of motion.     Cervical back: Normal range of motion and neck supple.  Lymphadenopathy:     Lower Body: No right inguinal adenopathy. No left inguinal adenopathy.  Skin:    General: Skin is warm and dry.  Neurological:     General: No focal deficit present.     Mental Status: She is alert and oriented to person, place, and  time.  Psychiatric:        Mood and Affect: Mood normal.        Behavior: Behavior normal.      Assessment And Plan:     1. Routine general medical examination at health care facility Comments: A full exam was performed. Importance of monthly self breast exams was discussed with the patient. PATIENT IS ADVISED TO GET 30-45 MINUTES  REGULAR EXERCISE NO LESS THAN FOUR TO FIVE DAYS PER WEEK - BOTH WEIGHTBEARING EXERCISES AND AEROBIC ARE RECOMMENDED.  PATIENT IS ADVISED TO FOLLOW A HEALTHY DIET WITH AT LEAST SIX FRUITS/VEGGIES PER DAY, DECREASE INTAKE OF RED MEAT, AND TO INCREASE FISH INTAKE TO TWO DAYS PER WEEK.  MEATS/FISH SHOULD NOT BE FRIED, BAKED OR BROILED IS PREFERABLE.  IT IS ALSO IMPORTANT TO CUT BACK ON YOUR SUGAR INTAKE. PLEASE AVOID ANYTHING WITH ADDED SUGAR, CORN SYRUP OR OTHER SWEETENERS. IF YOU MUST USE A SWEETENER, YOU CAN TRY STEVIA. IT IS ALSO IMPORTANT TO AVOID ARTIFICIALLY SWEETENERS AND DIET BEVERAGES. LASTLY, I SUGGEST WEARING SPF 50 SUNSCREEN ON EXPOSED PARTS AND ESPECIALLY WHEN IN THE DIRECT SUNLIGHT FOR AN EXTENDED PERIOD OF TIME.  PLEASE AVOID FAST FOOD RESTAURANTS AND INCREASE YOUR WATER INTAKE. - CBC - CMP14+EGFR - Lipid panel  2. Pap smear, low-risk Comments: Pap smear performed. Stool heme negative.  - Cytology -Pap Smear  3. Pure hypercholesterolemia Comments: She is currently on atorvastatin M-Sat, skips Sundays. She is encouraged to avoid fried foods, exercise at least 150 minutes/wk and increase fiber intake.   4. Anxiety Comments: Her sx are stable w/ citalopram. She will c/w current meds.   5. Class 1 obesity due to excess calories without serious comorbidity with body mass index (BMI) of 32.0 to 32.9 in adult Comments: She is aware of 9lb weight gain. Again, encouraged to aim for at least 150 minutes of exercise per week.   Patient was given opportunity to ask questions. Patient verbalized understanding of the plan and was able to repeat key elements of the  plan. All questions were answered to their satisfaction.   I, Maximino Greenland, MD, have reviewed all documentation for this visit. The documentation on 04/17/22 for the exam, diagnosis, procedures, and orders are all accurate and complete.  THE PATIENT IS ENCOURAGED TO PRACTICE SOCIAL DISTANCING DUE TO THE COVID-19 PANDEMIC.

## 2022-04-18 ENCOUNTER — Encounter: Payer: 59 | Admitting: Internal Medicine

## 2022-04-18 LAB — CMP14+EGFR
ALT: 12 IU/L (ref 0–32)
AST: 19 IU/L (ref 0–40)
Albumin/Globulin Ratio: 2.1 (ref 1.2–2.2)
Albumin: 4.4 g/dL (ref 3.8–4.9)
Alkaline Phosphatase: 69 IU/L (ref 44–121)
BUN/Creatinine Ratio: 33 — ABNORMAL HIGH (ref 9–23)
BUN: 19 mg/dL (ref 6–24)
Bilirubin Total: 0.4 mg/dL (ref 0.0–1.2)
CO2: 24 mmol/L (ref 20–29)
Calcium: 8.9 mg/dL (ref 8.7–10.2)
Chloride: 101 mmol/L (ref 96–106)
Creatinine, Ser: 0.58 mg/dL (ref 0.57–1.00)
Globulin, Total: 2.1 g/dL (ref 1.5–4.5)
Glucose: 87 mg/dL (ref 70–99)
Potassium: 4.5 mmol/L (ref 3.5–5.2)
Sodium: 138 mmol/L (ref 134–144)
Total Protein: 6.5 g/dL (ref 6.0–8.5)
eGFR: 104 mL/min/{1.73_m2} (ref >59)

## 2022-04-18 LAB — LIPID PANEL
Chol/HDL Ratio: 2 ratio (ref 0.0–4.4)
Cholesterol, Total: 182 mg/dL (ref 100–199)
HDL: 92 mg/dL (ref >39)
LDL Chol Calc (NIH): 80 mg/dL (ref 0–99)
Triglycerides: 48 mg/dL (ref 0–149)
VLDL Cholesterol Cal: 10 mg/dL (ref 5–40)

## 2022-04-18 LAB — CBC
Hematocrit: 42.4 % (ref 34.0–46.6)
Hemoglobin: 14.1 g/dL (ref 11.1–15.9)
MCH: 29.4 pg (ref 26.6–33.0)
MCHC: 33.3 g/dL (ref 31.5–35.7)
MCV: 89 fL (ref 79–97)
Platelets: 234 10*3/uL (ref 150–450)
RBC: 4.79 x10E6/uL (ref 3.77–5.28)
RDW: 12.9 % (ref 11.7–15.4)
WBC: 5.5 10*3/uL (ref 3.4–10.8)

## 2022-04-19 ENCOUNTER — Encounter: Payer: Self-pay | Admitting: Internal Medicine

## 2022-04-19 LAB — CYTOLOGY - PAP
Comment: NEGATIVE
Diagnosis: NEGATIVE
High risk HPV: NEGATIVE

## 2022-05-02 ENCOUNTER — Other Ambulatory Visit (HOSPITAL_COMMUNITY): Payer: Self-pay

## 2022-05-03 ENCOUNTER — Other Ambulatory Visit (HOSPITAL_COMMUNITY): Payer: Self-pay

## 2022-05-10 ENCOUNTER — Other Ambulatory Visit (HOSPITAL_COMMUNITY): Payer: Self-pay

## 2022-05-10 MED ORDER — EPINEPHRINE 0.3 MG/0.3ML IJ SOAJ
INTRAMUSCULAR | 1 refills | Status: DC
  Filled 2022-05-10: qty 2, 2d supply, fill #0

## 2022-06-13 ENCOUNTER — Ambulatory Visit
Admission: RE | Admit: 2022-06-13 | Discharge: 2022-06-13 | Disposition: A | Discharge status: Home / Self Care | Payer: 59 | Source: Ambulatory Visit

## 2022-06-13 DIAGNOSIS — Z1231 Encounter for screening mammogram for malignant neoplasm of breast: Secondary | ICD-10-CM | POA: Diagnosis not present

## 2022-06-17 ENCOUNTER — Other Ambulatory Visit (HOSPITAL_COMMUNITY): Payer: Self-pay

## 2022-07-29 ENCOUNTER — Other Ambulatory Visit (HOSPITAL_COMMUNITY): Payer: Self-pay

## 2022-07-30 ENCOUNTER — Other Ambulatory Visit (HOSPITAL_COMMUNITY): Payer: Self-pay

## 2022-08-01 ENCOUNTER — Encounter: Payer: Self-pay | Admitting: Internal Medicine

## 2022-08-20 ENCOUNTER — Ambulatory Visit (INDEPENDENT_AMBULATORY_CARE_PROVIDER_SITE_OTHER): Payer: 59 | Admitting: Internal Medicine

## 2022-08-20 ENCOUNTER — Other Ambulatory Visit (HOSPITAL_COMMUNITY): Payer: Self-pay

## 2022-08-20 ENCOUNTER — Encounter: Payer: Self-pay | Admitting: Internal Medicine

## 2022-08-20 VITALS — BP 116/80 | HR 61 | Temp 97.5°F | Ht 64.0 in | Wt 180.0 lb

## 2022-08-20 DIAGNOSIS — F419 Anxiety disorder, unspecified: Secondary | ICD-10-CM

## 2022-08-20 DIAGNOSIS — Z6830 Body mass index (BMI) 30.0-30.9, adult: Secondary | ICD-10-CM

## 2022-08-20 DIAGNOSIS — E6609 Other obesity due to excess calories: Secondary | ICD-10-CM | POA: Diagnosis not present

## 2022-08-20 DIAGNOSIS — Z79899 Other long term (current) drug therapy: Secondary | ICD-10-CM | POA: Diagnosis not present

## 2022-08-20 DIAGNOSIS — E7800 Pure hypercholesterolemia, unspecified: Secondary | ICD-10-CM

## 2022-08-20 MED ORDER — ATORVASTATIN CALCIUM 20 MG PO TABS
20.0000 mg | ORAL_TABLET | Freq: Every day | ORAL | 2 refills | Status: DC
  Filled 2022-08-20: qty 90, 90d supply, fill #0
  Filled 2022-09-09: qty 78, 90d supply, fill #0
  Filled 2022-12-11: qty 78, 90d supply, fill #1
  Filled 2023-03-31: qty 78, 90d supply, fill #2
  Filled 2023-07-07: qty 36, 42d supply, fill #3

## 2022-08-20 NOTE — Patient Instructions (Signed)
Exercising to Stay Healthy To become healthy and stay healthy, it is recommended that you do moderate-intensity and vigorous-intensity exercise. You can tell that you are exercising at a moderate intensity if your heart starts beating faster and you start breathing faster but can still hold a conversation. You can tell that you are exercising at a vigorous intensity if you are breathing much harder and faster and cannot hold a conversation while exercising. How can exercise benefit me? Exercising regularly is important. It has many health benefits, such as: Improving overall fitness, flexibility, and endurance. Increasing bone density. Helping with weight control. Decreasing body fat. Increasing muscle strength and endurance. Reducing stress and tension, anxiety, depression, or anger. Improving overall health. What guidelines should I follow while exercising? Before you start a new exercise program, talk with your health care provider. Do not exercise so much that you hurt yourself, feel dizzy, or get very short of breath. Wear comfortable clothes and wear shoes with good support. Drink plenty of water while you exercise to prevent dehydration or heat stroke. Work out until your breathing and your heartbeat get faster (moderate intensity). How often should I exercise? Choose an activity that you enjoy, and set realistic goals. Your health care provider can help you make an activity plan that is individually designed and works best for you. Exercise regularly as told by your health care provider. This may include: Doing strength training two times a week, such as: Lifting weights. Using resistance bands. Push-ups. Sit-ups. Yoga. Doing a certain intensity of exercise for a given amount of time. Choose from these options: A total of 150 minutes of moderate-intensity exercise every week. A total of 75 minutes of vigorous-intensity exercise every week. A mix of moderate-intensity and  vigorous-intensity exercise every week. Children, pregnant women, people who have not exercised regularly, people who are overweight, and older adults may need to talk with a health care provider about what activities are safe to perform. If you have a medical condition, be sure to talk with your health care provider before you start a new exercise program. What are some exercise ideas? Moderate-intensity exercise ideas include: Walking 1 mile (1.6 km) in about 15 minutes. Biking. Hiking. Golfing. Dancing. Water aerobics. Vigorous-intensity exercise ideas include: Walking 4.5 miles (7.2 km) or more in about 1 hour. Jogging or running 5 miles (8 km) in about 1 hour. Biking 10 miles (16.1 km) or more in about 1 hour. Lap swimming. Roller-skating or in-line skating. Cross-country skiing. Vigorous competitive sports, such as football, basketball, and soccer. Jumping rope. Aerobic dancing. What are some everyday activities that can help me get exercise? Yard work, such as: Pushing a lawn mower. Raking and bagging leaves. Washing your car. Pushing a stroller. Shoveling snow. Gardening. Washing windows or floors. How can I be more active in my day-to-day activities? Use stairs instead of an elevator. Take a walk during your lunch break. If you drive, park your car farther away from your work or school. If you take public transportation, get off one stop early and walk the rest of the way. Stand up or walk around during all of your indoor phone calls. Get up, stretch, and walk around every 30 minutes throughout the day. Enjoy exercise with a friend. Support to continue exercising will help you keep a regular routine of activity. Where to find more information You can find more information about exercising to stay healthy from: [place] Department of Health and Human Services: www.hhs.gov Centers for Disease Control and Prevention (  CDC): www.cdc.gov Summary Exercising regularly is  important. It will improve your overall fitness, flexibility, and endurance. Regular exercise will also improve your overall health. It can help you control your weight, reduce stress, and improve your bone density. Do not exercise so much that you hurt yourself, feel dizzy, or get very short of breath. Before you start a new exercise program, talk with your health care provider. This information is not intended to replace advice given to you by your health care provider. Make sure you discuss any questions you have with your health care provider. Document Revised: 01/19/2021 Document Reviewed: 01/19/2021 Elsevier Patient Education  2023 Elsevier Inc.  

## 2022-08-20 NOTE — Progress Notes (Signed)
Rich Brave Llittleton,acting as a Education administrator for Maximino Greenland, MD.,have documented all relevant documentation on the behalf of Maximino Greenland, MD,as directed by  Maximino Greenland, MD while in the presence of Maximino Greenland, MD.    Subjective:     Patient ID: Victoria Herman , female    DOB: 10-21-1960 , 61 y.o.   MRN: [id number]   No chief complaint on file.   HPI  The patient is here today for a follow-up on her cholesterol. She has been taking atorvastatin without any issues.  She reports compliance with meds. Denies headaches, chest pain and shortness of breath.   Hyperlipidemia This is a chronic problem. The current episode started more than 1 year ago. The problem is uncontrolled. Recent lipid tests were reviewed and are high. Exacerbating diseases include obesity. Current antihyperlipidemic treatment includes herbal therapy. Compliance problems include adherence to exercise.      Past Medical History:  Diagnosis Date   Allergy    Anxiety    GERD (gastroesophageal reflux disease)    Hyperlipidemia    Seasonal allergies    Superficial thrombophlebitis 03-2014, 07-2014     Family History  Problem Relation Age of Onset   Hypertension Mother    Diabetes Mother    Varicose Veins Mother    Varicose Veins Father    Cancer Father    Hypertension Sister    Diabetes Sister    Thyroid disease Sister    Varicose Veins Brother    Hypertension Brother    Hypertension Brother    Varicose Veins Brother    Healthy Daughter    Migraines Daughter    Colon cancer Neg Hx    Esophageal cancer Neg Hx    Rectal cancer Neg Hx    Stomach cancer Neg Hx    Breast cancer Neg Hx      Current Outpatient Medications:    amLODipine (NORVASC) 5 MG tablet, Take 1 tablet (5 mg total) by mouth daily., Disp: 30 tablet, Rfl: 2   Barberry-Oreg Grape-Goldenseal (BERBERINE COMPLEX PO), Take 2 tablets by mouth daily., Disp: , Rfl:    Calcium Carb-Cholecalciferol (CALCIUM 600 + D PO), Take 1 tablet  by mouth daily. Vit D 12.5, Disp: , Rfl:    cetirizine (ZYRTEC) 10 MG tablet, Take 10 mg by mouth daily., Disp: , Rfl:    Cholecalciferol (VITAMIN D3 SUPER STRENGTH) 50 MCG (2000 UT) CAPS, Take 2,000 Units by mouth daily., Disp: , Rfl:    citalopram (CELEXA) 20 MG tablet, Take 1 tablet (20 mg total) by mouth daily., Disp: 90 tablet, Rfl: 2   EPINEPHrine (EPIPEN 2-PAK) 0.3 mg/0.3 mL IJ SOAJ injection, Use as directed as needed for systemic reactions, Disp: 2 each, Rfl: 1   EPINEPHrine 0.3 mg/0.3 mL IJ SOAJ injection, Inject 0.3 mg into the skin once as needed., Disp: , Rfl:    hydrocortisone 2.5 % cream, Apply topically 2 (two) times daily., Disp: 30 g, Rfl: 0   mometasone (NASONEX) 50 MCG/ACT nasal spray, Use 2 sprays in each nostril once daily, Disp: 17 g, Rfl: 5   atorvastatin (LIPITOR) 20 MG tablet, Take 1 tablet (20 mg total) by mouth daily Monday through Saturday, skip Sunday, Disp: 90 tablet, Rfl: 2   Allergies  Allergen Reactions   E-Mycin [Erythromycin] Diarrhea   Sulfa Antibiotics Rash     Review of Systems  Constitutional: Negative.   Eyes: Negative.   Respiratory: Negative.    Cardiovascular: Negative.   Gastrointestinal: Negative.  Musculoskeletal: Negative.   Skin: Negative.   Neurological: Negative.   Psychiatric/Behavioral: Negative.       Today's Vitals   08/20/22 1549  BP: 116/80  Pulse: 61  Temp: (!) 97.5 F (36.4 C)  Weight: 180 lb (81.6 kg)  Height: _0  (1.626 m)  PainSc: 0-No pain   Body mass index is 30.9 kg/m.  Wt Readings from Last 3 Encounters:  08/20/22 180 lb (81.6 kg)  04/17/22 190 lb (86.2 kg)  03/13/22 189 lb (85.7 kg)    Objective:  Physical Exam Vitals and nursing note reviewed.  Constitutional:      Appearance: Normal appearance.  HENT:     Head: Normocephalic and atraumatic.     Nose:     Comments: Masked     Mouth/Throat:     Comments: Masked  Eyes:     Extraocular Movements: Extraocular movements intact.  Cardiovascular:      Rate and Rhythm: Normal rate and regular rhythm.     Heart sounds: Normal heart sounds.  Pulmonary:     Effort: Pulmonary effort is normal.     Breath sounds: Normal breath sounds.  Musculoskeletal:     Cervical back: Normal range of motion.  Skin:    General: Skin is warm.  Neurological:     General: No focal deficit present.     Mental Status: She is alert.  Psychiatric:        Mood and Affect: Mood normal.        Behavior: Behavior normal.      Assessment And Plan:     1. Pure hypercholesterolemia Comments: Chronic, July 2023 results reviewed.  I will not check labs today, she will c/w atorvastatin 25m daily.  2. Anxiety Comments: Chronic, stable on meds.  She will c/w citalopram 217mdaily.  3. Class 1 obesity due to excess calories with serious comorbidity and body mass index (BMI) of 30.0 to 30.9 in adult Comments: She was congratulated on her 10lb weight loss in the past 3-4 months. She is encouraged to keep up the great work!  4. Drug therapy - BMP8+EGFR   Patient was given opportunity to ask questions. Patient verbalized understanding of the plan and was able to repeat key elements of the plan. All questions were answered to their satisfaction.   I, RoMaximino GreenlandMD, have reviewed all documentation for this visit. The documentation on 08/20/22 for the exam, diagnosis, procedures, and orders are all accurate and complete.   IF YOU HAVE BEEN REFERRED TO A SPECIALIST, IT MAY TAKE 1-2 WEEKS TO SCHEDULE/PROCESS THE REFERRAL. IF YOU HAVE NOT HEARD FROM US/SPECIALIST IN TWO WEEKS, PLEASE GIVE [place] CALL AT [phone] X [phone].   THE PATIENT IS ENCOURAGED TO PRACTICE SOCIAL DISTANCING DUE TO THE COVID-19 PANDEMIC.

## 2022-08-21 LAB — BMP8+EGFR
BUN/Creatinine Ratio: 24 — ABNORMAL HIGH (ref 9–23)
BUN: 18 mg/dL (ref 6–24)
CO2: 28 mmol/L (ref 20–29)
Calcium: 9.7 mg/dL (ref 8.7–10.2)
Chloride: 101 mmol/L (ref 96–106)
Creatinine, Ser: 0.74 mg/dL (ref 0.57–1.00)
Glucose: 77 mg/dL (ref 70–99)
Potassium: 4.7 mmol/L (ref 3.5–5.2)
Sodium: 140 mmol/L (ref 134–144)
eGFR: 93 mL/min/{1.73_m2} (ref >59)

## 2022-08-22 ENCOUNTER — Ambulatory Visit: Payer: 59 | Admitting: Internal Medicine

## 2022-09-09 ENCOUNTER — Other Ambulatory Visit (HOSPITAL_COMMUNITY): Payer: Self-pay

## 2022-09-12 ENCOUNTER — Other Ambulatory Visit (HOSPITAL_COMMUNITY): Payer: Self-pay

## 2022-09-27 ENCOUNTER — Encounter: Payer: Self-pay | Admitting: Internal Medicine

## 2022-11-05 ENCOUNTER — Encounter: Payer: Self-pay | Admitting: Internal Medicine

## 2022-11-06 ENCOUNTER — Encounter: Payer: Self-pay | Admitting: Internal Medicine

## 2022-11-06 ENCOUNTER — Telehealth (INDEPENDENT_AMBULATORY_CARE_PROVIDER_SITE_OTHER): Payer: Commercial Managed Care - PPO | Admitting: Internal Medicine

## 2022-11-06 DIAGNOSIS — R0683 Snoring: Secondary | ICD-10-CM

## 2022-11-06 NOTE — Progress Notes (Signed)
Virtual Visit via Video   This visit type was conducted due to national recommendations for restrictions regarding the COVID-19 Pandemic (e.g. social distancing) in an effort to limit this patient's exposure and mitigate transmission in our community.  Due to her co-morbid illnesses, this patient is at least at moderate risk for complications without adequate follow up.  This format is felt to be most appropriate for this patient at this time.  All issues noted in this document were discussed and addressed.  A limited physical exam was performed with this format.    This visit type was conducted due to national recommendations for restrictions regarding the COVID-19 Pandemic (e.g. social distancing) in an effort to limit this patient's exposure and mitigate transmission in our community.  Patients identity confirmed using two different identifiers.  This format is felt to be most appropriate for this patient at this time.  All issues noted in this document were discussed and addressed.  No physical exam was performed (except for noted visual exam findings with Video Visits).    Date:  11/06/2022   ID:  Victoria Herman, DOB Dec 25, 1960, MRN [id number]  Patient Location:  Work, private office  Provider location:   Office    Chief Complaint:  "I am snoring.I need referral to Neuro"  History of Present Illness:    Victoria Herman is a 62 y.o. female who presents via video conferencing for a telehealth visit today.    The patient does not have symptoms concerning for COVID-19 infection (fever, chills, cough, or new shortness of breath).   She presents today for virtual visit. She prefers this method of contact due to COVID-19 pandemic.  She states her husband says she snores a lot. Her hand surgeon advised her husband that she "sawed down some trees" during surgery. She does also report h/o deviated septum.  She would like Neuro referral. She does not wish to have ENT surgery to address the  deviated septum.   She does report occasional nocturia.  She does feel rested upon awakening, denies early am headaches. She denies daytime somnolence.       Past Medical History:  Diagnosis Date   Allergy    Anxiety    GERD (gastroesophageal reflux disease)    Hyperlipidemia    Seasonal allergies    Superficial thrombophlebitis 03-2014, 07-2014   Past Surgical History:  Procedure Laterality Date   COLONOSCOPY     ORIF WRIST FRACTURE Left 04/12/2020   Procedure: OPEN REDUCTION INTERNAL FIXATION (ORIF) WRIST FRACTURE and repair reconstruction as indicated;  Surgeon: Iran Planas, MD;  Location: [facility];  Service: Orthopedics;  Laterality: Left;  with IV sedation  Needs 2 hours   WISDOM TOOTH EXTRACTION       No outpatient medications have been marked as taking for the 11/06/22 encounter (Video Visit) with Glendale Chard, MD.     Allergies:   E-mycin [erythromycin] and Sulfa antibiotics   Social History   Tobacco Use   Smoking status: Never    Passive exposure: Past   Smokeless tobacco: Never  Vaping Use   Vaping Use: Never used  Substance Use Topics   Alcohol use: Yes    Comment: occas.   Drug use: No     Family Hx: The patient's family history includes Cancer in her father; Diabetes in her mother and sister; Healthy in her daughter; Hypertension in her brother, brother, mother, and sister; Migraines in her daughter; Thyroid disease in her sister; Varicose Veins in her  brother, brother, father, and mother. There is no history of Colon cancer, Esophageal cancer, Rectal cancer, Stomach cancer, or Breast cancer.  ROS:   Please see the history of present illness.    Review of Systems  Constitutional: Negative.   Respiratory: Negative.    Cardiovascular: Negative.   Gastrointestinal: Negative.   Neurological: Negative.   Psychiatric/Behavioral: Negative.      All other systems reviewed and are negative.   Labs/Other Tests and Data Reviewed:    Recent  Labs: 04/17/2022: ALT 12; Hemoglobin 14.1; Platelets 234 08/20/2022: BUN 18; Creatinine, Ser 0.74; Potassium 4.7; Sodium 140   Recent Lipid Panel Lab Results  Component Value Date/Time   CHOL 182 04/17/2022 12:37 PM   TRIG 48 04/17/2022 12:37 PM   HDL 92 04/17/2022 12:37 PM   CHOLHDL 2.0 04/17/2022 12:37 PM   LDLCALC 80 04/17/2022 12:37 PM    Wt Readings from Last 3 Encounters:  08/20/22 180 lb (81.6 kg)  04/17/22 190 lb (86.2 kg)  03/13/22 189 lb (85.7 kg)     Exam:    Vital Signs:  LMP 07/23/2013     Physical Exam Vitals and nursing note reviewed.  Constitutional:      Appearance: Normal appearance.  HENT:     Head: Normocephalic and atraumatic.  Eyes:     Extraocular Movements: Extraocular movements intact.  Pulmonary:     Effort: Pulmonary effort is normal.  Musculoskeletal:     Cervical back: Normal range of motion.  Neurological:     Mental Status: She is alert and oriented to person, place, and time.  Psychiatric:        Mood and Affect: Affect normal.     ASSESSMENT & PLAN:    1. Snoring Comments: She is encouraged to sleep on her side. Best practice is to avoid sedatives/alcohol. I will refer her to Neuro for sleep study. She agrees w/ tx plan. - Ambulatory referral to Neurology     COVID-19 Education: The signs and symptoms of COVID-19 were discussed with the patient and how to seek care for testing (follow up with PCP or arrange E-visit).  The importance of social distancing was discussed today.  Patient Risk:   After full review of this patients clinical status, I feel that they are at least moderate risk at this time.  Time:   Today, I have spent 10 minutes/ seconds with the patient with telehealth technology discussing above diagnoses.  This time includes the disconnection time and delay in connection.    Medication Adjustments/Labs and Tests Ordered: Current medicines are reviewed at length with the patient today.  Concerns regarding  medicines are outlined above.   Tests Ordered: Orders Placed This Encounter  Procedures   Ambulatory referral to Neurology    Medication Changes: No orders of the defined types were placed in this encounter.   Disposition:  Follow up prn  Signed, Maximino Greenland, MD

## 2022-11-08 ENCOUNTER — Other Ambulatory Visit: Payer: Self-pay | Admitting: Internal Medicine

## 2022-11-09 MED ORDER — CITALOPRAM HYDROBROMIDE 20 MG PO TABS
20.0000 mg | ORAL_TABLET | Freq: Every day | ORAL | 2 refills | Status: DC
  Filled 2022-11-09: qty 90, 90d supply, fill #0
  Filled 2023-02-17: qty 90, 90d supply, fill #1
  Filled 2023-06-01 – 2023-06-03 (×2): qty 90, 90d supply, fill #2

## 2022-11-11 ENCOUNTER — Other Ambulatory Visit (HOSPITAL_COMMUNITY): Payer: Self-pay

## 2022-11-13 ENCOUNTER — Other Ambulatory Visit (HOSPITAL_COMMUNITY): Payer: Self-pay

## 2022-12-11 ENCOUNTER — Other Ambulatory Visit (HOSPITAL_COMMUNITY): Payer: Self-pay

## 2022-12-12 ENCOUNTER — Other Ambulatory Visit (HOSPITAL_COMMUNITY): Payer: Self-pay

## 2022-12-17 ENCOUNTER — Ambulatory Visit (INDEPENDENT_AMBULATORY_CARE_PROVIDER_SITE_OTHER): Payer: Commercial Managed Care - PPO | Admitting: Neurology

## 2022-12-17 ENCOUNTER — Encounter: Payer: Self-pay | Admitting: Neurology

## 2022-12-17 VITALS — BP 128/84 | HR 74 | Ht 64.0 in | Wt 194.0 lb

## 2022-12-17 DIAGNOSIS — M2619 Other specified anomalies of jaw-cranial base relationship: Secondary | ICD-10-CM

## 2022-12-17 DIAGNOSIS — R0683 Snoring: Secondary | ICD-10-CM | POA: Diagnosis not present

## 2022-12-17 DIAGNOSIS — E669 Obesity, unspecified: Secondary | ICD-10-CM | POA: Diagnosis not present

## 2022-12-17 NOTE — Progress Notes (Signed)
SLEEP MEDICINE CLINIC    Provider:  Larey Seat, MD  Primary Care Physician:  Glendale Chard, [address] [place] [address]     Referring Provider: Glendale Chard, [address] [place],  [place] [address]          Chief Complaint according to patient   Patient presents with:     New Patient (Initial Visit)     Pt is well, reports she has been snoring significantly at least the last 2-3 years. She doesn't have any trouble sleeping. Does wear a mouth guard for grinding.       HISTORY OF PRESENT ILLNESS:  Victoria Herman is a 62 y.o. female patient who is seen upon referral on 12/17/2022 from Dr Baird Cancer for a sleep consultation..  Chief concern according to patient :  " I grind my teeth, I clench my jaw. I work 40 hours a week and then I take care of my autistic 62 year-old sister".    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      Sleep relevant medical history: stress induced vivid dreams,  not insomnia.  Raynauds, dental braces, bruxism,  ENT : no surgeries, wisdom teeth extracted.      Family medical /sleep history: Oldest Brother on CPAP with OSA, brother with hypersomnia. 2 siblings with essential tremor, father was affected. Sister with autism, functioning.   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  No pets, The patient  used to work in shifts( night/ rotating,) over a decade ago . She recently had a colonoscopy and was not told she had apnea.   Tobacco use: none , both parents smoked. ETOH use : 1 glass/ 3 a week,  Caffeine intake in form of Coffee( 1 cup ) or Soda( 16 ounce) ,Tea ( /) or energy drinks.  Sleep habits are as follows: The patient's dinner time is between 5-6 PM. The patient goes to bed at 10-11 PM and continues to sleep for 7 or more hours.  She lost weight and  had a lot less GERD.   The preferred sleep position is laterally, right , with the support of 1 pillow. Memory-foam.  Dreams are reportedly  frequent/ dome vivid.   The patient wakes up with an alarm at 6 10. 6.30  AM is the usual rise time.  She reports not feeling refreshed or restored in AM, with symptoms such as dry mouth. Naps are not taken - she only naps when ill.   Review of Systems: Out of a complete 14 system review, the patient complains of only the following symptoms, and all other reviewed systems are negative.:  Fatigue, sleepiness , snoring, fragmented sleep.   Hip pain.   How likely are you to doze in the following situations: 0 = not likely, 1 = slight chance, 2 = moderate chance, 3 = high chance   Sitting and Reading? Watching Television? Sitting inactive in a public place (theater or meeting)? As a passenger in a car for an hour without a break? Lying down in the afternoon when circumstances permit? Sitting and talking to someone? Sitting quietly after lunch without alcohol? In a car, while stopped for a few minutes in traffic?   Total = 3/ 24 points   FSS endorsed at 10/ 63  points.   Social History   Socioeconomic History   Marital status: Married    Spouse name: Not on file   Number of children: Not on file   Years of education: Not on file   Highest education level: Bachelor's degree (e.g., BA, AB, BS)  Occupational History   Not on file  Tobacco Use   Smoking status: Never    Passive exposure: Past   Smokeless tobacco: Never  Vaping Use   Vaping Use: Never used  Substance and Sexual Activity   Alcohol use: Yes    Comment: occas.   Drug use: No   Sexual activity: Not on file  Other Topics Concern   Not on file  Social History Narrative   Not on file   Social Determinants of Health   Financial Resource Strain: Low Risk  (02/14/2022)   Overall Financial Resource Strain (CARDIA)    Difficulty of Paying Living Expenses: Not hard at all  Food  Insecurity: No Food Insecurity (02/14/2022)   Hunger Vital Sign    Worried About Running Out of Food in the Last Year: Never true    Ran Out of Food in the Last Year: Never true  Transportation Needs: No Transportation Needs (02/14/2022)   PRAPARE - Hydrologist (Medical): No    Lack of Transportation (Non-Medical): No  Physical Activity: Sufficiently Active (02/14/2022)   Exercise Vital Sign    Days of Exercise per Week: 4 days    Minutes of Exercise per Session: 50 min  Stress: Stress Concern Present (02/14/2022)   [place]    Feeling of Stress : To some extent  Social Connections: Unknown (02/14/2022)   Social Connection and Isolation Panel [NHANES]    Frequency of Communication with Friends and Family: More than three times a week    Frequency of Social Gatherings with Friends and Family: Once a week    Attends Religious Services: More than 4 times per year    Active Member of [employer] or Organizations: Yes    Attends Music therapist: More than 4 times per year    Marital Status: Not on file    Family History  Problem Relation Age of Onset   Hypertension Mother    Diabetes Mother    Varicose Veins Mother    Varicose Veins Father    Cancer Father    Hypertension Sister    Diabetes Sister    Thyroid disease Sister    Varicose Veins Brother    Hypertension Brother    Hypertension Brother    Varicose Veins Brother    Healthy Daughter    Migraines Daughter    Colon cancer Neg Hx    Esophageal cancer Neg Hx    Rectal cancer Neg Hx    Stomach cancer Neg Hx    Breast cancer Neg Hx     Past Medical History:  Diagnosis Date   Allergy    Anxiety    GERD (gastroesophageal reflux disease)    Hyperlipidemia    Seasonal allergies    Superficial thrombophlebitis 03-2014, 07-2014    Past Surgical History:  Procedure Laterality Date   COLONOSCOPY     ORIF WRIST FRACTURE  Left 04/12/2020   Procedure: OPEN REDUCTION INTERNAL FIXATION (ORIF) WRIST FRACTURE and repair reconstruction as indicated;  Surgeon: Iran Planas, MD;  Location: [facility];  Service: Orthopedics;  Laterality: Left;  with IV sedation  Needs 2 hours  WISDOM TOOTH EXTRACTION       Current Outpatient Medications on File Prior to Visit  Medication Sig Dispense Refill   amLODipine (NORVASC) 5 MG tablet Take 1 tablet (5 mg total) by mouth daily. 30 tablet 2   atorvastatin (LIPITOR) 20 MG tablet Take 1 tablet (20 mg total) by mouth daily Monday through Saturday, skip Sunday 90 tablet 2   Barberry-Oreg Grape-Goldenseal (BERBERINE COMPLEX PO) Take 2 tablets by mouth daily.     Calcium Carb-Cholecalciferol (CALCIUM 600 + D PO) Take 1 tablet by mouth daily. Vit D 12.5     cetirizine (ZYRTEC) 10 MG tablet Take 10 mg by mouth daily.     Cholecalciferol (VITAMIN D3 SUPER STRENGTH) 50 MCG (2000 UT) CAPS Take 2,000 Units by mouth daily.     citalopram (CELEXA) 20 MG tablet Take 1 tablet (20 mg total) by mouth daily. 90 tablet 2   EPINEPHrine (EPIPEN 2-PAK) 0.3 mg/0.3 mL IJ SOAJ injection Use as directed as needed for systemic reactions 2 each 1   mometasone (NASONEX) 50 MCG/ACT nasal spray Use 2 sprays in each nostril once daily 17 g 5   EPINEPHrine 0.3 mg/0.3 mL IJ SOAJ injection Inject 0.3 mg into the skin once as needed. (Patient not taking: Reported on 12/17/2022)     hydrocortisone 2.5 % cream Apply topically 2 (two) times daily. (Patient not taking: Reported on 12/17/2022) 30 g 0   No current facility-administered medications on file prior to visit.    Allergies  Allergen Reactions   E-Mycin [Erythromycin] Diarrhea   Sulfa Antibiotics Rash     DIAGNOSTIC DATA (LABS, IMAGING, TESTING) - I reviewed patient records, labs, notes, testing and imaging myself where available.  Lab Results  Component Value Date   WBC 5.5 04/17/2022   HGB 14.1 04/17/2022   HCT 42.4 04/17/2022   MCV 89 04/17/2022   PLT  234 04/17/2022      Component Value Date/Time   NA 140 08/20/2022 1639   K 4.7 08/20/2022 1639   CL 101 08/20/2022 1639   CO2 28 08/20/2022 1639   GLUCOSE 77 08/20/2022 1639   BUN 18 08/20/2022 1639   CREATININE 0.74 08/20/2022 1639   CALCIUM 9.7 08/20/2022 1639   PROT 6.5 04/17/2022 1237   ALBUMIN 4.4 04/17/2022 1237   AST 19 04/17/2022 1237   ALT 12 04/17/2022 1237   ALKPHOS 69 04/17/2022 1237   BILITOT 0.4 04/17/2022 1237   GFRNONAA 90 04/16/2021 0000   GFRAA 109 04/16/2021 0000   Lab Results  Component Value Date   CHOL 182 04/17/2022   HDL 92 04/17/2022   LDLCALC 80 04/17/2022   TRIG 48 04/17/2022   CHOLHDL 2.0 04/17/2022   Lab Results  Component Value Date   HGBA1C 5.5 04/16/2021   Lab Results  Component Value Date   VITAMINB12 424 04/10/2021   Lab Results  Component Value Date   TSH 1.12 04/16/2021    PHYSICAL EXAM:  Today's Vitals   12/17/22 1247  BP: 128/84  Pulse: 74  Weight: 194 lb (88 kg)  Height: '5\' 4"'$  (1.626 m)   Body mass index is 33.3 kg/m.   Wt Readings from Last 3 Encounters:  12/17/22 194 lb (88 kg)  08/20/22 180 lb (81.6 kg)  04/17/22 190 lb (86.2 kg)     Ht Readings from Last 3 Encounters:  12/17/22 '5\' 4"'$  (1.626 m)  08/20/22 '5\' 4"'$  (1.626 m)  04/17/22 '5\' 4"'$  (1.626 m)      General: The  patient is awake, alert and appears not in acute distress. The patient is well groomed. Head: Normocephalic, atraumatic. Neck is supple.  Mallampati 3,  neck circumference:14.75 inches . Overbite- Nasal airflow  patent.  crossbite noticed.  Dental status: bruxism marks. Cardiovascular:  Regular rate and cardiac rhythm by pulse,  without distended neck veins. Respiratory: Lungs are clear to auscultation.  Skin:  Without evidence of ankle edema, or rash. Trunk: The patient's posture is erect.   NEUROLOGIC EXAM: The patient is awake and alert, oriented to place and time.   Memory subjective described as intact.  Attention span &  concentration ability appears normal.  Speech is fluent,  without  dysarthria, dysphonia or aphasia.  Mood and affect are appropriate.   Cranial nerves: no loss of smell or taste reported  Pupils are equal and briskly reactive to light. Funduscopic exam deferred..  Extraocular movements in vertical and horizontal planes were intact and without nystagmus. No Diplopia. Visual fields by finger perimetry are intact. Hearing was intact to soft voice and finger rubbing.    Facial sensation intact to fine touch.  Facial motor strength is symmetric and tongue and uvula move midline.  Neck ROM : rotation, tilt and flexion extension were normal for age and shoulder shrug was symmetrical.    Motor exam:  Symmetric bulk, tone and ROM.   Normal tone without cog- wheeling, symmetric grip strength .   Sensory:  Fine touch,  vibration were  normal.  Proprioception tested in the upper extremities was normal.   Coordination: Rapid alternating movements in the fingers/hands were of normal speed.  The Finger-to-nose maneuver was intact without evidence of ataxia, dysmetria or tremor.   Gait and station: Patient could rise unassisted from a seated position, walked without assistive device.  Stance is of normal width/ base. Toe and heel walk were deferred.  Deep tendon reflexes: in the  upper and lower extremities are symmetric and intact.  Babinski response was deferred.   ASSESSMENT AND PLAN 62 y.o. year old female  here with: no RLS     1) witnessed snoring and apnea per spouse, but neither fatigue nor sleepiness are present.   2) risk factors for OSA - are high grade Mallompatti, smaller crowded dentition, overbite.   3) BMI is elevated, but she lost 33 pounds and is fighting not to regain. Husband reported her snoring got worse.   4) on celexa, but not on sedating medications. Norvasc for Raynauds.   I am going to screen for sleep apnea, this can be done by HST. The wait for a PSG is too long  right now.    I plan to follow up either personally or through our NP within 3-5 months.   I would like to thank Glendale Chard, MD and Glendale Chard, [address] [place],  [place] [id number] for allowing me to meet with and to take care of this pleasant patient.   CC: I will share my notes with PCP .  After spending a total time of  35  minutes face to face and additional time for physical and neurologic examination, review of laboratory studies,  personal review of imaging studies, reports and results of other testing and review of referral information / records as far as provided in visit,   Electronically signed by: Larey Seat, MD 12/17/2022 1:00 PM  [facility] certified by The [employer] of Sleep Medicine and Diplomate of the [employer] of Sleep  Medicine. Board certified In Neurology through the [place], Fellow of the [employer] of Neurology. Medical Director of [employer].

## 2022-12-17 NOTE — Patient Instructions (Signed)
Quality Sleep Information, Adult Quality sleep is important for your mental and physical health. It also improves your quality of life. Quality sleep means you: Are asleep for most of the time you are in bed. Fall asleep within 30 minutes. Wake up no more than once a night. Are awake for no longer than 20 minutes if you do wake up during the night. Most adults need 7-8 hours of quality sleep each night. How can poor sleep affect me? If you do not get enough quality sleep, you may have: Mood swings. Daytime sleepiness. Decreased alertness, reaction time, and concentration. Sleep disorders, such as insomnia and sleep apnea. Difficulty with: Solving problems. Coping with stress. Paying attention. These issues may affect your performance and productivity at work, school, and home. Lack of sleep may also put you at higher risk for accidents, suicide, and risky behaviors. If you do not get quality sleep, you may also be at higher risk for several health problems, including: Infections. Type 2 diabetes. Heart disease. High blood pressure. Obesity. Worsening of long-term conditions, like arthritis, kidney disease, depression, Parkinson's disease, and epilepsy. What actions can I take to get more quality sleep? Sleep schedule and routine Stick to a sleep schedule. Go to sleep and wake up at about the same time each day. Do not try to sleep less on weekdays and make up for lost sleep on weekends. This does not work. Limit naps during the day to 30 minutes or less. Do not take naps in the late afternoon. Make time to relax before bed. Reading, listening to music, or taking a hot bath promotes quality sleep. Make your bedroom a place that promotes quality sleep. Keep your bedroom dark, quiet, and at a comfortable room temperature. Make sure your bed is comfortable. Avoid using electronic devices that give off bright blue light for 30 minutes before bedtime. Your brain perceives bright blue light  as sunlight. This includes television, phones, and computers. If you are lying awake in bed for longer than 20 minutes, get up and do a relaxing activity until you feel sleepy. Lifestyle     Try to get at least 30 minutes of exercise on most days. Do not exercise 2-3 hours before going to bed. Do not use any products that contain nicotine or tobacco. These products include cigarettes, chewing tobacco, and vaping devices, such as e-cigarettes. If you need help quitting, ask your health care provider. Do not drink caffeinated beverages for at least 8 hours before going to bed. Coffee, tea, and some sodas contain caffeine. Do not drink alcohol or eat large meals close to bedtime. Try to get at least 30 minutes of sunlight every day. Morning sunlight is best. Medical concerns Work with your health care provider to treat medical conditions that may affect sleeping, such as: Nasal obstruction. Snoring. Sleep apnea and other sleep disorders. Talk to your health care provider if you think any of your prescription medicines may cause you to have difficulty falling or staying asleep. If you have sleep problems, talk with a sleep consultant. If you think you have a sleep disorder, talk with your health care provider about getting evaluated by a specialist. Where to find more information Sleep Foundation: sleepfoundation.org American Academy of Sleep Medicine: aasm.org Centers for Disease Control and Prevention (CDC): cdc.gov Contact a health care provider if: You have trouble getting to sleep or staying asleep. You often wake up very early in the morning and cannot get back to sleep. You have daytime sleepiness. You   have daytime sleep attacks of suddenly falling asleep and sudden muscle weakness (narcolepsy). You have a tingling sensation in your legs with a strong urge to move your legs (restless legs syndrome). You stop breathing briefly during sleep (sleep apnea). You think you have a sleep  disorder or are taking a medicine that is affecting your quality of sleep. Summary Most adults need 7-8 hours of quality sleep each night. Getting enough quality sleep is important for your mental and physical health. Make your bedroom a place that promotes quality sleep, and avoid things that may cause you to have poor sleep, such as alcohol, caffeine, smoking, or large meals. Talk to your health care provider if you have trouble falling asleep or staying asleep. This information is not intended to replace advice given to you by your health care provider. Make sure you discuss any questions you have with your health care provider. Document Revised: 01/16/2022 Document Reviewed: 01/16/2022 Elsevier Patient Education  2023 Elsevier Inc. Healthy Living: Sleep In this video, you will learn why sleep is an important part of a healthy lifestyle. To view the content, go to this web address: https://pe.elsevier.com/[email or web address]  This video will expire on: 09/18/2024. If you need access to this video following this date, please reach out to the healthcare provider who assigned it to you. This information is not intended to replace advice given to you by your health care provider. Make sure you discuss any questions you have with your health care provider. Elsevier Patient Education  2023 Elsevier Inc.  

## 2022-12-19 ENCOUNTER — Ambulatory Visit: Admitting: Internal Medicine

## 2022-12-24 ENCOUNTER — Encounter: Payer: Self-pay | Admitting: Internal Medicine

## 2022-12-24 ENCOUNTER — Ambulatory Visit (INDEPENDENT_AMBULATORY_CARE_PROVIDER_SITE_OTHER): Payer: Commercial Managed Care - PPO | Admitting: Internal Medicine

## 2022-12-24 VITALS — BP 124/88 | HR 94 | Temp 98.6°F | Ht 64.0 in | Wt 189.6 lb

## 2022-12-24 DIAGNOSIS — F419 Anxiety disorder, unspecified: Secondary | ICD-10-CM

## 2022-12-24 DIAGNOSIS — E7800 Pure hypercholesterolemia, unspecified: Secondary | ICD-10-CM | POA: Diagnosis not present

## 2022-12-24 DIAGNOSIS — Z6832 Body mass index (BMI) 32.0-32.9, adult: Secondary | ICD-10-CM | POA: Diagnosis not present

## 2022-12-24 DIAGNOSIS — E6609 Other obesity due to excess calories: Secondary | ICD-10-CM | POA: Diagnosis not present

## 2022-12-24 DIAGNOSIS — R030 Elevated blood-pressure reading, without diagnosis of hypertension: Secondary | ICD-10-CM

## 2022-12-24 DIAGNOSIS — E559 Vitamin D deficiency, unspecified: Secondary | ICD-10-CM

## 2022-12-24 NOTE — Patient Instructions (Addendum)
The 10-year ASCVD risk score (Arnett DK, et al., 2019) is: 2%   Values used to calculate the score:     Age: 62 years     Sex: Female     Is Non-Hispanic African American: No     Diabetic: No     Tobacco smoker: No     Systolic Blood Pressure: [id number] mmHg     Is BP treated: No     HDL Cholesterol: 92 mg/dL     Total Cholesterol: 182 mg/dL   Cholesterol Content in Foods Cholesterol is a waxy, fat-like substance that helps to carry fat in the blood. The body needs cholesterol in small amounts, but too much cholesterol can cause damage to the arteries and heart. What foods have cholesterol?  Cholesterol is found in animal-based foods, such as meat, seafood, and dairy. Generally, low-fat dairy and lean meats have less cholesterol than full-fat dairy and fatty meats. The milligrams of cholesterol per serving (mg per serving) of common cholesterol-containing foods are listed below. Meats and other proteins Egg -- one large whole egg has 186 mg. Veal shank -- 4 oz (113 g) has 141 mg. Lean ground [place] (93% lean) -- 4 oz (113 g) has 118 mg. Fat-trimmed lamb loin -- 4 oz (113 g) has 106 mg. Lean ground beef (90% lean) -- 4 oz (113 g) has 100 mg. Lobster -- 3.5 oz (99 g) has 90 mg. Pork loin chops -- 4 oz (113 g) has 86 mg. Canned salmon -- 3.5 oz (99 g) has 83 mg. Fat-trimmed beef top loin -- 4 oz (113 g) has 78 mg. Frankfurter -- 1 frank (3.5 oz or 99 g) has 77 mg. Crab -- 3.5 oz (99 g) has 71 mg. Roasted chicken without skin, white meat -- 4 oz (113 g) has 66 mg. Light bologna -- 2 oz (57 g) has 45 mg. Deli-cut [place] -- 2 oz (57 g) has 31 mg. Canned tuna -- 3.5 oz (99 g) has 31 mg. Berniece Salines -- 1 oz (28 g) has 29 mg. Oysters and mussels (raw) -- 3.5 oz (99 g) has 25 mg. Mackerel -- 1 oz (28 g) has 22 mg. Trout -- 1 oz (28 g) has 20 mg. Pork sausage -- 1 link (1 oz or 28 g) has 17 mg. Salmon -- 1 oz (28 g) has 16 mg. Tilapia -- 1 oz (28 g) has 14 mg. Dairy Soft-serve ice cream --  cup  (4 oz or 86 g) has 103 mg. Whole-milk yogurt -- 1 cup (8 oz or 245 g) has 29 mg. Cheddar cheese -- 1 oz (28 g) has 28 mg. American cheese -- 1 oz (28 g) has 28 mg. Whole milk -- 1 cup (8 oz or 250 mL) has 23 mg. 2% milk -- 1 cup (8 oz or 250 mL) has 18 mg. Cream cheese -- 1 tablespoon (Tbsp) (14.5 g) has 15 mg. Cottage cheese --  cup (4 oz or 113 g) has 14 mg. Low-fat (1%) milk -- 1 cup (8 oz or 250 mL) has 10 mg. Sour cream -- 1 Tbsp (12 g) has 8.5 mg. Low-fat yogurt -- 1 cup (8 oz or 245 g) has 8 mg. Nonfat Greek yogurt -- 1 cup (8 oz or 228 g) has 7 mg. Half-and-half cream -- 1 Tbsp (15 mL) has 5 mg. Fats and oils Cod liver oil -- 1 tablespoon (Tbsp) (13.6 g) has 82 mg. Butter -- 1 Tbsp (14 g) has 15 mg. Lard -- 1 Tbsp (  12.8 g) has 14 mg. Bacon grease -- 1 Tbsp (12.9 g) has 14 mg. Mayonnaise -- 1 Tbsp (13.8 g) has 5-10 mg. Margarine -- 1 Tbsp (14 g) has 3-10 mg. The items listed above may not be a complete list of foods with cholesterol. Exact amounts of cholesterol in these foods may vary depending on specific ingredients and brands. Contact a dietitian for more information. What foods do not have cholesterol? Most plant-based foods do not have cholesterol unless you combine them with a food that has cholesterol. Foods without cholesterol include: Grains and cereals. Vegetables. Fruits. Vegetable oils, such as olive, canola, and sunflower oil. Legumes, such as peas, beans, and lentils. Nuts and seeds. Egg whites. The items listed above may not be a complete list of foods that do not have cholesterol. Contact a dietitian for more information. Summary The body needs cholesterol in small amounts, but too much cholesterol can cause damage to the arteries and heart. Cholesterol is found in animal-based foods, such as meat, seafood, and dairy. Generally, low-fat dairy and lean meats have less cholesterol than full-fat dairy and fatty meats. This information is not intended to replace  advice given to you by your health care provider. Make sure you discuss any questions you have with your health care provider. Document Revised: 02/02/2021 Document Reviewed: 02/02/2021 Elsevier Patient Education  [place].

## 2022-12-24 NOTE — Progress Notes (Signed)
Barnet Glasgow Martin,acting as a Education administrator for Maximino Greenland, MD.,have documented all relevant documentation on the behalf of Maximino Greenland, MD,as directed by  Maximino Greenland, MD while in the presence of Maximino Greenland, MD.    Subjective:     Patient ID: Victoria Herman , female    DOB: 1961-08-16 , 62 y.o.   MRN: [id number]   Chief Complaint  Patient presents with   Hyperlipidemia    HPI  The patient is here today for a follow-up on her cholesterol. She has been taking atorvastatin without any issues.  She reports compliance with meds. Denies headaches, chest pain and shortness of breath.   BP Readings from Last 3 Encounters: 12/24/22 : (!) 136/90 12/17/22 : 128/84 08/20/22 : 116/80    Hyperlipidemia This is a chronic problem. The current episode started more than 1 year ago. The problem is uncontrolled. Recent lipid tests were reviewed and are high. Exacerbating diseases include obesity. Current antihyperlipidemic treatment includes herbal therapy. Compliance problems include adherence to exercise.      Past Medical History:  Diagnosis Date   Allergy    Anxiety    GERD (gastroesophageal reflux disease)    Hyperlipidemia    Seasonal allergies    Superficial thrombophlebitis 03-2014, 07-2014     Family History  Problem Relation Age of Onset   Hypertension Mother    Diabetes Mother    Varicose Veins Mother    Varicose Veins Father    Cancer Father    Hypertension Sister    Diabetes Sister    Thyroid disease Sister    Varicose Veins Brother    Hypertension Brother    Hypertension Brother    Varicose Veins Brother    Healthy Daughter    Migraines Daughter    Colon cancer Neg Hx    Esophageal cancer Neg Hx    Rectal cancer Neg Hx    Stomach cancer Neg Hx    Breast cancer Neg Hx      Current Outpatient Medications:    amLODipine (NORVASC) 5 MG tablet, Take 1 tablet (5 mg total) by mouth daily., Disp: 30 tablet, Rfl: 2   atorvastatin (LIPITOR) 20 MG tablet,  Take 1 tablet (20 mg total) by mouth daily Monday through Saturday, skip Sunday, Disp: 90 tablet, Rfl: 2   Barberry-Oreg Grape-Goldenseal (BERBERINE COMPLEX PO), Take 2 tablets by mouth daily., Disp: , Rfl:    Calcium Carb-Cholecalciferol (CALCIUM 600 + D PO), Take 1 tablet by mouth daily. Vit D 12.5, Disp: , Rfl:    cetirizine (ZYRTEC) 10 MG tablet, Take 10 mg by mouth daily., Disp: , Rfl:    Cholecalciferol (VITAMIN D3 SUPER STRENGTH) 50 MCG (2000 UT) CAPS, Take 2,000 Units by mouth daily., Disp: , Rfl:    citalopram (CELEXA) 20 MG tablet, Take 1 tablet (20 mg total) by mouth daily., Disp: 90 tablet, Rfl: 2   EPINEPHrine (EPIPEN 2-PAK) 0.3 mg/0.3 mL IJ SOAJ injection, Use as directed as needed for systemic reactions, Disp: 2 each, Rfl: 1   mometasone (NASONEX) 50 MCG/ACT nasal spray, Use 2 sprays in each nostril once daily, Disp: 17 g, Rfl: 5   EPINEPHrine 0.3 mg/0.3 mL IJ SOAJ injection, Inject 0.3 mg into the skin once as needed. (Patient not taking: Reported on 12/17/2022), Disp: , Rfl:    hydrocortisone 2.5 % cream, Apply topically 2 (two) times daily. (Patient not taking: Reported on 12/17/2022), Disp: 30 g, Rfl: 0   Allergies  Allergen Reactions  E-Mycin [Erythromycin] Diarrhea   Sulfa Antibiotics Rash     Review of Systems  Constitutional: Negative.   HENT: Negative.    Respiratory: Negative.    Cardiovascular: Negative.   Gastrointestinal: Negative.   Musculoskeletal: Negative.   Skin: Negative.   Neurological: Negative.   Psychiatric/Behavioral: Negative.       Today's Vitals   12/24/22 1603 12/24/22 1640  BP: (!) 136/90 124/88  Pulse: 94   Temp: 98.6 F (37 C)   TempSrc: Oral   Weight: 189 lb 9.6 oz (86 kg)   Height: 5\' 4"  (1.626 m)   PainSc: 0-No pain    Body mass index is 32.54 kg/m.  Wt Readings from Last 3 Encounters:  12/24/22 189 lb 9.6 oz (86 kg)  12/17/22 194 lb (88 kg)  08/20/22 180 lb (81.6 kg)    Objective:  Physical Exam Vitals and nursing note  reviewed.  Constitutional:      Appearance: Normal appearance.  HENT:     Head: Normocephalic and atraumatic.     Nose:     Comments: Masked     Mouth/Throat:     Comments: Masked  Eyes:     Extraocular Movements: Extraocular movements intact.  Cardiovascular:     Rate and Rhythm: Normal rate and regular rhythm.     Heart sounds: Normal heart sounds.  Pulmonary:     Effort: Pulmonary effort is normal.     Breath sounds: Normal breath sounds.  Musculoskeletal:     Cervical back: Normal range of motion.  Skin:    General: Skin is warm.  Neurological:     General: No focal deficit present.     Mental Status: She is alert.  Psychiatric:        Mood and Affect: Mood normal.        Behavior: Behavior normal.      Assessment And Plan:     1. Pure hypercholesterolemia Comments: Chronic, we discussed use of cardiac calcium scoring. I will check labs as below. She will c/w atorvastatin for now. - CT CARDIAC SCORING (SELF PAY ONLY); Future - CMP14+EGFR - TSH - Lipid panel  2. Elevated blood pressure reading Comments: She is encouraged to decrease her intake of packaged foods and processed meats which tend to be high in sodium.  3. Anxiety Comments: Chronic, sx are controlled with citalopram. She wll c/w 20mg  dosage.  4. Vitamin D deficiency disease Comments: I will check a vitamin D level and supplement as needed. - Vitamin D (25 hydroxy)  5. Class 1 obesity due to excess calories with serious comorbidity and body mass index (BMI) of 32.0 to 32.9 in adult Comments: She is encouraged to aim for at least 150 minutes of exercise/week, while striving for BMI<30 to decrease cardiac risk. Agrees to [place] referral. - Amb Ref to Medical Weight Management   Patient was given opportunity to ask questions. Patient verbalized understanding of the plan and was able to repeat key elements of the plan. All questions were answered to their satisfaction.    I, Maximino Greenland, MD, have  reviewed all documentation for this visit. The documentation on 12/24/22 for the exam, diagnosis, procedures, and orders are all accurate and complete.   IF YOU HAVE BEEN REFERRED TO A SPECIALIST, IT MAY TAKE 1-2 WEEKS TO SCHEDULE/PROCESS THE REFERRAL. IF YOU HAVE NOT HEARD FROM US/SPECIALIST IN TWO WEEKS, PLEASE GIVE [place] A CALL AT [phone].   THE PATIENT IS ENCOURAGED TO PRACTICE SOCIAL DISTANCING DUE  TO THE COVID-19 PANDEMIC.

## 2022-12-25 LAB — CMP14+EGFR
ALT: 13 IU/L (ref 0–32)
AST: 21 IU/L (ref 0–40)
Albumin/Globulin Ratio: 2 (ref 1.2–2.2)
Albumin: 4.4 g/dL (ref 3.8–4.9)
Alkaline Phosphatase: 68 IU/L (ref 44–121)
BUN/Creatinine Ratio: 27 (ref 12–28)
BUN: 23 mg/dL (ref 8–27)
Bilirubin Total: 0.6 mg/dL (ref 0.0–1.2)
CO2: 23 mmol/L (ref 20–29)
Calcium: 9.3 mg/dL (ref 8.7–10.3)
Chloride: 102 mmol/L (ref 96–106)
Creatinine, Ser: 0.84 mg/dL (ref 0.57–1.00)
Globulin, Total: 2.2 g/dL (ref 1.5–4.5)
Glucose: 75 mg/dL (ref 70–99)
Potassium: 4.3 mmol/L (ref 3.5–5.2)
Sodium: 140 mmol/L (ref 134–144)
Total Protein: 6.6 g/dL (ref 6.0–8.5)
eGFR: 80 mL/min/{1.73_m2} (ref >59)

## 2022-12-25 LAB — LIPID PANEL
Chol/HDL Ratio: 2.2 ratio (ref 0.0–4.4)
Cholesterol, Total: 199 mg/dL (ref 100–199)
HDL: 89 mg/dL (ref >39)
LDL Chol Calc (NIH): 96 mg/dL (ref 0–99)
Triglycerides: 77 mg/dL (ref 0–149)
VLDL Cholesterol Cal: 14 mg/dL (ref 5–40)

## 2022-12-25 LAB — VITAMIN D 25 HYDROXY (VIT D DEFICIENCY, FRACTURES): Vit D, 25-Hydroxy: 99.5 ng/mL (ref 30.0–100.0)

## 2022-12-25 LAB — TSH: TSH: 2.21 u[IU]/mL (ref 0.450–4.500)

## 2022-12-31 ENCOUNTER — Encounter: Payer: Self-pay | Admitting: Internal Medicine

## 2023-01-02 ENCOUNTER — Telehealth: Payer: Self-pay | Admitting: Neurology

## 2023-01-02 NOTE — Telephone Encounter (Signed)
[employer] cone no auth req & [employer] pending faxed notes

## 2023-01-10 ENCOUNTER — Encounter: Payer: Self-pay | Admitting: Neurology

## 2023-01-15 ENCOUNTER — Ambulatory Visit: Payer: Commercial Managed Care - PPO | Admitting: Neurology

## 2023-01-15 DIAGNOSIS — G4733 Obstructive sleep apnea (adult) (pediatric): Secondary | ICD-10-CM

## 2023-01-15 DIAGNOSIS — E669 Obesity, unspecified: Secondary | ICD-10-CM

## 2023-01-15 DIAGNOSIS — E66811 Obesity, class 1: Secondary | ICD-10-CM

## 2023-01-15 DIAGNOSIS — E6609 Other obesity due to excess calories: Secondary | ICD-10-CM

## 2023-01-15 DIAGNOSIS — R0683 Snoring: Secondary | ICD-10-CM

## 2023-01-15 DIAGNOSIS — M2619 Other specified anomalies of jaw-cranial base relationship: Secondary | ICD-10-CM

## 2023-01-16 ENCOUNTER — Ambulatory Visit (HOSPITAL_COMMUNITY)
Admission: RE | Admit: 2023-01-16 | Discharge: 2023-01-16 | Disposition: A | Discharge status: Home / Self Care | Payer: Commercial Managed Care - PPO | Source: Ambulatory Visit | Attending: Internal Medicine | Admitting: Internal Medicine

## 2023-01-16 DIAGNOSIS — E7800 Pure hypercholesterolemia, unspecified: Secondary | ICD-10-CM | POA: Insufficient documentation

## 2023-01-19 ENCOUNTER — Encounter: Payer: Self-pay | Admitting: Internal Medicine

## 2023-01-22 NOTE — Progress Notes (Signed)
[facility]   HOME SLEEP TEST REPORT ( mail out by Watch PAT)   STUDY DATE:  01-22-2023 DOB:  1961-05-14 MRN: [id number]    ORDERING CLINICIAN:  REFERRING CLINICIAN:    CLINICAL INFORMATION/HISTORY: 12-19-2022:  Patient of Dr Allyne Gee' seen for a sleep consultation..  Chief concern according to patient :  " I grind my teeth, I clench my jaw. I work 40 hours a week and then I take care of my autistic 61 year-old sister".    I have the pleasure of seeing CHANDELLE HARKEY 12/17/22 a right-handed female RN ( [employer] [facility]) with a for an evaluation of sleep disorders in the setting of :  1) witnessed snoring and apnea per spouse, but neither fatigue nor sleepiness are present.    2) risk factors for OSA - are high grade Mallompatti, smaller crowded dentition, overbite.    3) BMI is elevated, but she lost 33 pounds and is fighting not to regain. Husband reported her snoring got worse.       Epworth sleepiness score: 3 /2 FSS at 10/63 points    BMI: 33 kg/m   Neck Circumference: 14.75   FINDINGS:   Sleep Summary:   Total Recording Time (hours, min): 8 hours 44 minutes       Total Sleep Time (hours, min):   7 hours 45 minutes              Percent REM (%):   19.5%                                     Respiratory Indices:   Calculated pAHI (per hour):   32.4/h                          REM pAHI:      23.1/h                                           NREM pAHI: 34.7/h                            Positional AHI:   This patient slept the majority of the night in supine position associated with an AHI of 14.4/h versus a non-supine AHI of 17.2/h. Snoring reached a mean volume of 40 dB which is at threshold for this device.   Snoring, which is measured through the vibration detection at the chest wall, only occurred over 7% of the total recorded sleep time                                                Oxygen Saturation Statistics:   O2 Saturation  Range (%): Between a nadir of 84 and a maximum of 98% saturation with a mean saturation at 92%                                     O2 Saturation (minutes) <89%: 1.8 minutes  Pulse Rate Statistics:   Pulse Mean (bpm):   64 bpm              Pulse Range:   Between 51 and 88 bpm              IMPRESSION:  This HST confirms the presence of severe sleep apnea scored by AASM criteria.  Remarkable was the absence of central sleep apneic events in spite of a normal REM dominance of this apnea. There was strong supine sleep accentuation of the AHI.  No clinically significant hypoxemia was present.  Snoring was according to this recording very mild.   RECOMMENDATION: This apnea is too severe to benefit from a dental device alone.  A dental device reduces apnea between 50 and 70% the same is true for an inspire device.  With a baseline AHI over 30 this would not be enough and I strongly recommend positive airway pressure therapy.  I would like for Nurse Woodroof to start therapy with an auto titration CPAP device, between 5 and 16 cmH2O pressure, 2 cm water expiratory relief, heated humidification and a nasal interface of her choice.    INTERPRETING PHYSICIAN:   Melvyn Novas, MD   Medical Director of [facility] at [facility].

## 2023-01-23 ENCOUNTER — Encounter: Payer: Self-pay | Admitting: Neurology

## 2023-01-23 DIAGNOSIS — L820 Inflamed seborrheic keratosis: Secondary | ICD-10-CM | POA: Diagnosis not present

## 2023-01-28 DIAGNOSIS — E669 Obesity, unspecified: Secondary | ICD-10-CM | POA: Insufficient documentation

## 2023-01-28 DIAGNOSIS — M2619 Other specified anomalies of jaw-cranial base relationship: Secondary | ICD-10-CM | POA: Insufficient documentation

## 2023-01-28 NOTE — Procedures (Signed)
[facility]   HOME SLEEP TEST REPORT ( mail out by Watch PAT)   STUDY DATE:  01-22-2023 DOB:  07-13-1961 MRN: [id number]    ORDERING CLINICIAN:  REFERRING CLINICIAN:    CLINICAL INFORMATION/HISTORY: 12-19-2022:  Patient of Dr Allyne Gee' seen for a sleep consultation..  Chief concern according to patient :  " I grind my teeth, I clench my jaw. I work 40 hours a week and then I take care of my autistic 61 year-old sister".    I have the pleasure of seeing Victoria Herman 12/17/22 a right-handed female RN ( [employer] [facility]) with a for an evaluation of sleep disorders in the setting of :  1) witnessed snoring and apnea per spouse, but neither fatigue nor sleepiness are present.    2) risk factors for OSA - are high grade Mallompatti, smaller crowded dentition, overbite.    3) BMI is elevated, but she lost 33 pounds and is fighting not to regain. Husband reported her snoring got worse.       Epworth sleepiness score: 3 /2 FSS at 10/63 points    BMI: 33 kg/m   Neck Circumference: 14.75   FINDINGS:   Sleep Summary:   Total Recording Time (hours, min): 8 hours 44 minutes       Total Sleep Time (hours, min):   7 hours 45 minutes              Percent REM (%):   19.5%                                     Respiratory Indices:   Calculated pAHI (per hour):   32.4/h                          REM pAHI:      23.1/h                                           NREM pAHI: 34.7/h                            Positional AHI:   This patient slept the majority of the night in supine position associated with an AHI of 14.4/h versus a non-supine AHI of 17.2/h. Snoring reached a mean volume of 40 dB which is at threshold for this device.   Snoring, which is measured through the vibration detection at the chest wall, only occurred over 7% of the total recorded sleep time                                                Oxygen Saturation Statistics:   O2 Saturation Range  (%): Between a nadir of 84 and a maximum of 98% saturation with a mean saturation at 92%                                     O2 Saturation (minutes) <89%: 1.8 minutes  Pulse Rate Statistics:   Pulse Mean (bpm):   64 bpm              Pulse Range:   Between 51 and 88 bpm              IMPRESSION:  This HST confirms the presence of severe sleep apnea scored by AASM criteria.  Remarkable was the absence of central sleep apneic events in spite of a normal REM dominance of this apnea. There was strong supine sleep accentuation of the AHI.  No clinically significant hypoxemia was present.  Snoring was according to this recording very mild.   RECOMMENDATION: This apnea is too severe to benefit from a dental device alone.  A dental device reduces apnea between 50 and 70% the same is true for an inspire device.  With a baseline AHI over 30 this would not be enough and I strongly recommend positive airway pressure therapy.  I would like for Nurse Woodroof to start therapy with an auto titration CPAP device, between 5 and 16 cmH2O pressure, 2 cm water expiratory relief, heated humidification and a nasal interface of her choice.    INTERPRETING PHYSICIAN:   Melvyn Novas, MD   Medical Director of [facility] at [facility].

## 2023-01-28 NOTE — Progress Notes (Signed)
Severe apnea , but minimal snoring recorded.  I wrote for an auto-titration CPAP machine

## 2023-01-29 ENCOUNTER — Encounter: Payer: Self-pay | Admitting: Neurology

## 2023-01-29 ENCOUNTER — Telehealth: Payer: Self-pay

## 2023-01-29 NOTE — Telephone Encounter (Signed)
-----   Message from Melvyn Novas, MD sent at 01/28/2023  5:26 PM EDT ----- Severe apnea , but minimal snoring recorded.  I wrote for an auto-titration CPAP machine

## 2023-01-29 NOTE — Telephone Encounter (Signed)
I called pt. I advised pt that Dr. Vickey Huger reviewed their sleep study results and found that pt has sever OSA. Dr. Vickey Huger recommends that pt starts autopap. I reviewed PAP compliance expectations with the pt. Pt is agreeable to starting a CPAP. I advised pt that an order will be sent to a DME, [facility], and [facility] will call the pt within about one week after they file with the pt's insurance. [facility] will show the pt how to use the machine, fit for masks, and troubleshoot the CPAP if needed. A follow up appt was made for insurance purposes with Dr. Vickey Huger on 3pm. Pt verbalized understanding to arrive 15 minutes early and bring their CPAP. Pt verbalized understanding of results. Pt had no questions at this time but was encouraged to call back if questions arise. I have sent the order to [employer] and have received confirmation that they have received the order.

## 2023-01-29 NOTE — Telephone Encounter (Signed)
Addressed in TE 4/24.

## 2023-01-30 NOTE — Telephone Encounter (Signed)
New, Almon Register, CMA; Victoria Herman; Marveen Reeks; New, Tomie China Received, Thank you!      ----- Message ----- From: Bobbye Morton, CMA Sent: 01/29/2023   9:57 AM EDT To: Victoria Herman; Kathyrn Sheriff; Santina Evans; * Subject: new AutoPAP                                    New orders have been placed for the above pt, DOB: 2061-05-08 Thank

## 2023-02-13 DIAGNOSIS — G4733 Obstructive sleep apnea (adult) (pediatric): Secondary | ICD-10-CM | POA: Diagnosis not present

## 2023-02-17 ENCOUNTER — Other Ambulatory Visit (HOSPITAL_COMMUNITY): Payer: Self-pay

## 2023-02-18 ENCOUNTER — Ambulatory Visit (INDEPENDENT_AMBULATORY_CARE_PROVIDER_SITE_OTHER): Payer: Commercial Managed Care - PPO | Admitting: Nurse Practitioner

## 2023-02-18 ENCOUNTER — Encounter: Payer: Self-pay | Admitting: Nurse Practitioner

## 2023-02-18 VITALS — BP 125/75 | HR 68 | Temp 97.9°F | Ht 64.0 in | Wt 196.0 lb

## 2023-02-18 DIAGNOSIS — G4730 Sleep apnea, unspecified: Secondary | ICD-10-CM | POA: Insufficient documentation

## 2023-02-18 DIAGNOSIS — Z0289 Encounter for other administrative examinations: Secondary | ICD-10-CM

## 2023-02-18 DIAGNOSIS — E7849 Other hyperlipidemia: Secondary | ICD-10-CM | POA: Diagnosis not present

## 2023-02-18 DIAGNOSIS — E669 Obesity, unspecified: Secondary | ICD-10-CM

## 2023-02-18 DIAGNOSIS — G4733 Obstructive sleep apnea (adult) (pediatric): Secondary | ICD-10-CM

## 2023-02-18 DIAGNOSIS — Z6833 Body mass index (BMI) 33.0-33.9, adult: Secondary | ICD-10-CM

## 2023-02-18 NOTE — Progress Notes (Signed)
Office: [phone]  /  Fax: [phone]   Initial Visit  Victoria Herman was seen in clinic today to evaluate for obesity. She is interested in losing weight to improve overall health and reduce the risk of weight related complications. She presents today to review program treatment options, initial physical assessment, and evaluation.     She was referred by: PCP  When asked what else they would like to accomplish? She states: Improve existing medical conditions and Lose a target amount of weight : Goal weight 160  lbs  Weight history:  She started gaining weight in middle school.  Her weight has fluctuated over the years.   Bariatric surgery:  Has not had bariatric surgery   When asked how has your weight affected you? She states: Contributed to orthopedic problems or mobility issues  Some associated conditions: OSAS, OA both hands, anxiety, abnormal serum iron, HLD, Raynaud's, stress incontinence   Contributing factors: Family history and Pregnancy  Weight promoting medications identified: None  Current nutrition plan: None  Current level of physical activity: None  Current or previous pharmacotherapy: Qsymia.  She has tried Weight Watchers and [employer]  Response to medication: Lost weight initially but was unable to sustain weight loss     Past medical history includes:   Past Medical History:  Diagnosis Date   Allergy    Anxiety    GERD (gastroesophageal reflux disease)    Hyperlipidemia    Seasonal allergies    Superficial thrombophlebitis 03-2014, 07-2014     Objective:   BP 125/75   Pulse 68   Temp 97.9 F (36.6 C)   Ht 5\' 4"  (1.626 m)   Wt 196 lb (88.9 kg)   LMP 07/23/2013   SpO2 100%   BMI 33.64 kg/m  She was weighed on the bioimpedance scale: Body mass index is 33.64 kg/m.  Peak Weight:213 lbs , Body Fat%:46.5%, Visceral Fat Rating:13, Weight trend over the last 12 months: Increasing  General:  Alert, oriented and cooperative. Patient is  in no acute distress.  Respiratory: Normal respiratory effort, no problems with respiration noted   Gait: able to ambulate independently  Mental Status: Normal mood and affect. Normal behavior. Normal judgment and thought content.   DIAGNOSTIC DATA REVIEWED:  BMET    Component Value Date/Time   NA 140 12/24/2022 1714   K 4.3 12/24/2022 1714   CL 102 12/24/2022 1714   CO2 23 12/24/2022 1714   GLUCOSE 75 12/24/2022 1714   BUN 23 12/24/2022 1714   CREATININE 0.84 12/24/2022 1714   CALCIUM 9.3 12/24/2022 1714   GFRNONAA 90 04/16/2021 0000   GFRAA 109 04/16/2021 0000   Lab Results  Component Value Date   HGBA1C 5.5 04/16/2021   HGBA1C 5.6 03/29/2019   No results found for: "INSULIN" CBC    Component Value Date/Time   WBC 5.5 04/17/2022 1237   RBC 4.79 04/17/2022 1237   RBC 4.72 04/16/2021 0000   HGB 14.1 04/17/2022 1237   HCT 42.4 04/17/2022 1237   PLT 234 04/17/2022 1237   MCV 89 04/17/2022 1237   MCH 29.4 04/17/2022 1237   MCHC 33.3 04/17/2022 1237   RDW 12.9 04/17/2022 1237   Iron/TIBC/Ferritin/ %Sat    Component Value Date/Time   IRON 26 04/16/2021 0000   FERRITIN 86 04/16/2021 0000   Lipid Panel     Component Value Date/Time   CHOL 199 12/24/2022 1714   TRIG 77 12/24/2022 1714   HDL 89 12/24/2022 1714  CHOLHDL 2.2 12/24/2022 1714   LDLCALC 96 12/24/2022 1714   Hepatic Function Panel     Component Value Date/Time   PROT 6.6 12/24/2022 1714   ALBUMIN 4.4 12/24/2022 1714   AST 21 12/24/2022 1714   ALT 13 12/24/2022 1714   ALKPHOS 68 12/24/2022 1714   BILITOT 0.6 12/24/2022 1714      Component Value Date/Time   TSH 2.210 12/24/2022 1714     Assessment and Plan:   Obstructive sleep apnea syndrome Continue to follow up with neurology.  Use CPAP as directed  Other hyperlipidemia Continue to follow up with PCP. Continue meds as directed  Generalized obesity  BMI 33.0-33.9,adult        Obesity Treatment / Action Plan:  Patient will  work on garnering support from family and friends to begin weight loss journey. Will work on eliminating or reducing the presence of highly palatable, calorie dense foods in the home. Will complete provided nutritional and psychosocial assessment questionnaire before the next appointment. Will be scheduled for indirect calorimetry to determine resting energy expenditure in a fasting state.  This will allow [place] to create a reduced calorie, high-protein meal plan to promote loss of fat mass while preserving muscle mass. Counseled on the health benefits of losing 5%-15% of total body weight. Was counseled on nutritional approaches to weight loss and benefits of reducing processed foods and consuming plant-based foods and high quality protein as part of nutritional weight management. Was counseled on pharmacotherapy and role as an adjunct in weight management.   Obesity Education Performed Today:  She was weighed on the bioimpedance scale and results were discussed and documented in the synopsis.  We discussed obesity as a disease and the importance of a more detailed evaluation of all the factors contributing to the disease.  We discussed the importance of long term lifestyle changes which include nutrition, exercise and behavioral modifications as well as the importance of customizing this to her specific health and social needs.  We discussed the benefits of reaching a healthier weight to alleviate the symptoms of existing conditions and reduce the risks of the biomechanical, metabolic and psychological effects of obesity.  Victoria Herman appears to be in the action stage of change and states they are ready to start intensive lifestyle modifications and behavioral modifications.  30 minutes was spent today on this visit including the above counseling, pre-visit chart review, and post-visit documentation.  Reviewed by clinician on day of visit: allergies, medications, problem list, medical  history, surgical history, family history, social history, and previous encounter notes pertinent to obesity diagnosis.    Theodis Sato Shaleka Brines FNP-C

## 2023-02-20 ENCOUNTER — Encounter: Payer: Self-pay | Admitting: Bariatrics

## 2023-02-20 ENCOUNTER — Ambulatory Visit (INDEPENDENT_AMBULATORY_CARE_PROVIDER_SITE_OTHER): Payer: Commercial Managed Care - PPO | Admitting: Bariatrics

## 2023-02-20 VITALS — BP 121/71 | HR 55 | Temp 98.4°F | Ht 64.0 in | Wt 196.0 lb

## 2023-02-20 DIAGNOSIS — Z0000 Encounter for general adult medical examination without abnormal findings: Secondary | ICD-10-CM | POA: Diagnosis not present

## 2023-02-20 DIAGNOSIS — R5383 Other fatigue: Secondary | ICD-10-CM

## 2023-02-20 DIAGNOSIS — E669 Obesity, unspecified: Secondary | ICD-10-CM | POA: Insufficient documentation

## 2023-02-20 DIAGNOSIS — Z6833 Body mass index (BMI) 33.0-33.9, adult: Secondary | ICD-10-CM | POA: Diagnosis not present

## 2023-02-20 DIAGNOSIS — F32A Depression, unspecified: Secondary | ICD-10-CM | POA: Diagnosis not present

## 2023-02-20 DIAGNOSIS — E7849 Other hyperlipidemia: Secondary | ICD-10-CM

## 2023-02-20 DIAGNOSIS — G4733 Obstructive sleep apnea (adult) (pediatric): Secondary | ICD-10-CM

## 2023-02-20 DIAGNOSIS — R0602 Shortness of breath: Secondary | ICD-10-CM | POA: Insufficient documentation

## 2023-02-20 DIAGNOSIS — Z1331 Encounter for screening for depression: Secondary | ICD-10-CM

## 2023-02-20 NOTE — Progress Notes (Signed)
, °

## 2023-02-21 LAB — INSULIN, RANDOM: INSULIN: 11.3 u[IU]/mL (ref 2.6–24.9)

## 2023-02-21 LAB — VITAMIN B12: Vitamin B-12: 485 pg/mL (ref 232–1245)

## 2023-02-21 LAB — VITAMIN D 25 HYDROXY (VIT D DEFICIENCY, FRACTURES): Vit D, 25-Hydroxy: 64.8 ng/mL (ref 30.0–100.0)

## 2023-02-23 DIAGNOSIS — H524 Presbyopia: Secondary | ICD-10-CM | POA: Diagnosis not present

## 2023-02-24 NOTE — Progress Notes (Signed)
Chief Complaint:   Victoria Herman (MR# [id number]) is a 62 y.o. female who presents for evaluation and treatment of Victoria and related comorbidities. Current BMI is Body mass index is 33.64 kg/m. Victoria Herman has been struggling with her weight for many years and has been unsuccessful in either losing weight, maintaining weight loss, or reaching her healthy weight goal.  Victoria Herman is currently in the action stage of change and ready to dedicate time achieving and maintaining a healthier weight. Victoria Herman is interested in becoming our patient and working on intensive lifestyle modifications including (but not limited to) diet and exercise for weight loss.  Victoria Herman is here for her initial visit.  She met with Judeth Cornfield, nurse practitioner on 02/18/2023.  Victoria Herman's habits were reviewed today and are as follows: Her family eats meals together, she thinks her family will eat healthier with her, her desired weight loss is 36 lbs, she has been heavy most of her life, she started gaining weight in 2010 (dad had cancer), her heaviest weight ever was 213 pounds, she has significant food cravings issues, she snacks frequently in the evenings, she is frequently drinking liquids with calories, she frequently makes poor food choices, and she struggles with emotional eating.  Depression Screen Victoria Herman Food and Mood (modified PHQ-9) score was 6.  Subjective:   1. Other fatigue Victoria Herman admits to daytime somnolence and denies waking up still tired. Patient has a history of symptoms of daytime fatigue. Victoria Herman generally gets 6 or 8 hours of sleep per night, and states that she has generally restful sleep. Snoring is present. Apneic episodes are not present. Epworth Sleepiness Score is 2.   2. SOB (shortness of breath) on exertion Victoria Herman notes increasing shortness of breath with exercising and seems to be worsening over time with weight gain. She notes getting out of breath sooner with activity than she used to. This has  not gotten worse recently. Victoria Herman denies shortness of breath at rest or orthopnea.  3. Obstructive sleep apnea syndrome Victoria Herman started CPAP last week, and she notes more restful sleep.  4. Other hyperlipidemia Victoria Herman is taking Lipitor.  5. Health care maintenance Given Victoria.  Assessment/Plan:   1. Other fatigue Tayven does feel that her weight is causing her energy to be lower than it should be. Fatigue may be related to Victoria, depression or many other causes. Labs will be ordered, and in the meanwhile, Victoria Herman will focus on self care including making healthy food choices, increasing physical activity and focusing on stress reduction.  - EKG 12-Lead  2. SOB (shortness of breath) on exertion Victoria Herman does feel that she gets out of breath more easily that she used to when she exercises. Victoria Herman shortness of breath appears to be Victoria related and exercise induced. She has agreed to work on weight loss and gradually increase exercise to treat her exercise induced shortness of breath. Will continue to monitor closely.  3. Obstructive sleep apnea syndrome Victoria Herman will continue with her CPAP compliance.  4. Other hyperlipidemia Victoria Herman will continue Lipitor as directed.  5. Health care maintenance We will check labs today.  EKG and IC were done today and results were reviewed with the patient.  - Insulin, random - Vitamin B12 - VITAMIN D 25 Hydroxy (Vit-D Deficiency, Fractures)  6. Depression screening Victoria Herman had a positive depression screening. Depression is commonly associated with Victoria and often results in emotional eating behaviors. We will monitor this closely and work on CBT to help improve the  non-hunger eating patterns. Referral to Psychology may be required if no improvement is seen as she continues in our clinic.  7. Generalized Victoria - Insulin, random - Vitamin B12 - VITAMIN D 25 Hydroxy (Vit-D Deficiency, Fractures)  8. BMI 33.0-33.9,adult Victoria Herman is currently in the  action stage of change and her goal is to continue with weight loss efforts. I recommend Victoria Herman begin the structured treatment plan as follows:  She has agreed to the Category 2 Plan.  Meal planning was discussed.  Review labs with the patient from 12/24/2022, CMP, lipid, vitamin D, TSH, and glucose.  Exercise goals: No exercise has been prescribed at this time.   Behavioral modification strategies: increasing lean protein intake, decreasing simple carbohydrates, increasing vegetables, increasing water intake, meal planning and cooking strategies, keeping healthy foods in the home, and ways to avoid boredom eating.  She was informed of the importance of frequent follow-up visits to maximize her success with intensive lifestyle modifications for her multiple health conditions. She was informed we would discuss her lab results at her next visit unless there is a critical issue that needs to be addressed sooner. Victoria Herman agreed to keep her next visit at the agreed upon time to discuss these results.  Objective:   Blood pressure 121/71, pulse (!) 55, temperature 98.4 F (36.9 C), height 5\' 4"  (1.626 m), weight 196 lb (88.9 kg), last menstrual period 07/23/2013, SpO2 100 %. Body mass index is 33.64 kg/m.  EKG: Normal sinus rhythm, rate 54 BPM.  Indirect Calorimeter completed today shows a VO2 of 233 and a REE of 1613.  Her calculated basal metabolic rate is 4098 thus her basal metabolic rate is better than expected.  General: Cooperative, alert, well developed, in no acute distress. HEENT: Conjunctivae and lids unremarkable. Cardiovascular: Regular rhythm.  Lungs: Normal work of breathing. Neurologic: No focal deficits.   Lab Results  Component Value Date   CREATININE 0.84 12/24/2022   BUN 23 12/24/2022   NA 140 12/24/2022   K 4.3 12/24/2022   CL 102 12/24/2022   CO2 23 12/24/2022   Lab Results  Component Value Date   ALT 13 12/24/2022   AST 21 12/24/2022   ALKPHOS 68 12/24/2022    BILITOT 0.6 12/24/2022   Lab Results  Component Value Date   HGBA1C 5.5 04/16/2021   HGBA1C 5.6 03/29/2019   Lab Results  Component Value Date   INSULIN 11.3 02/20/2023   Lab Results  Component Value Date   TSH 2.210 12/24/2022   Lab Results  Component Value Date   CHOL 199 12/24/2022   HDL 89 12/24/2022   LDLCALC 96 12/24/2022   TRIG 77 12/24/2022   CHOLHDL 2.2 12/24/2022   Lab Results  Component Value Date   WBC 5.5 04/17/2022   HGB 14.1 04/17/2022   HCT 42.4 04/17/2022   MCV 89 04/17/2022   PLT 234 04/17/2022   Lab Results  Component Value Date   IRON 26 04/16/2021   FERRITIN 86 04/16/2021   Attestation Statements:   Reviewed by clinician on day of visit: allergies, medications, problem list, medical history, surgical history, family history, social history, and previous encounter notes.   Trude Mcburney, am acting as Energy manager for [employer], DO.   Time spent on visit including pre-visit chart review and post-visit charting and care was 50 minutes.    I have reviewed the above documentation for accuracy and completeness, and I agree with the above. Corinna Capra, DO

## 2023-02-26 ENCOUNTER — Encounter: Payer: Self-pay | Admitting: Bariatrics

## 2023-02-27 ENCOUNTER — Encounter: Payer: Self-pay | Admitting: Internal Medicine

## 2023-03-06 NOTE — Progress Notes (Signed)
Office Visit Note  Patient: Victoria Herman             Date of Birth: 1961-06-03           MRN: [id number]             PCP: Dorothyann Peng, MD Referring: Dorothyann Peng, MD Visit Date: 03/18/2023 Occupation: @GUAROCC @  Subjective:  Raynauds and osteoarthritis  History of Present Illness: Victoria Herman is a 62 y.o. female with osteoarthritis, osteopenia and Raynauds.  She returns today after her last visit on March 13, 2022.  Patient states that her symptoms are well-controlled on amlodipine which she uses only during the winter months.  She denies any history of digital ulcers.  She bought a pair of Scandinavian wool gloves which helped during the winter months.  She also used hand warmers.  She states she has couple of episodes of tennis elbow which resolved by itself.  Coccygodynia completely resolved.  She continues to have some stiffness in her hands but no joint swelling.  She denies any history of oral ulcers, nasal ulcers, malar rash, photosensitivity, lymphadenopathy, inflammatory arthritis.  Patient states that she was recently diagnosed with obstructive sleep apnea.  She has been using a CPAP.  She has not noticed any improvement in her alertness or fatigue.  She is going to weight management clinic for weight loss.    Activities of Daily Living:  Patient reports morning stiffness for 30 minutes.   Patient Reports nocturnal pain.  Difficulty dressing/grooming: Denies Difficulty climbing stairs: Denies Difficulty getting out of chair: Denies Difficulty using hands for taps, buttons, cutlery, and/or writing: Denies  Review of Systems  Constitutional:  Negative for fatigue.  HENT:  Negative for mouth sores and mouth dryness.   Eyes:  Negative for dryness.  Respiratory:  Negative for shortness of breath.   Cardiovascular:  Negative for chest pain and palpitations.  Gastrointestinal:  Negative for blood in stool, constipation and diarrhea.  Endocrine: Negative for increased  urination.  Genitourinary:  Negative for involuntary urination.  Musculoskeletal:  Positive for joint pain, joint pain and morning stiffness. Negative for gait problem, joint swelling, myalgias, muscle weakness, muscle tenderness and myalgias.  Skin:  Positive for color change. Negative for rash, hair loss and sensitivity to sunlight.  Allergic/Immunologic: Negative for susceptible to infections.  Neurological:  Negative for dizziness and headaches.  Hematological:  Negative for swollen glands.  Psychiatric/Behavioral:  Negative for depressed mood and sleep disturbance. The patient is nervous/anxious.     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   Obesity (BMI 30.0-34.9) 01/28/2023   Snoring 12/17/2022   Pure hypercholesterolemia 04/17/2022   Raynaud's phenomenon without gangrene 03/13/2022   Primary osteoarthritis of both hands 03/13/2022   Stress incontinence in female 04/10/2021   Abnormal serum iron level 04/10/2021   Anxiety 04/10/2021   Class 1 obesity due to excess calories without serious comorbidity with body mass index (BMI) of 32.0 to 32.9 in adult 04/10/2021   Superficial thrombophlebitis 03/21/2014    Past Medical History:  Diagnosis Date   Allergy    Anxiety    Constipation    GERD (gastroesophageal reflux disease)    Hyperlipidemia    Osteoarthritis  Seasonal allergies    Sleep apnea    Superficial thrombophlebitis 03-2014, 07-2014   Swallowing difficulty    Vitamin D deficiency     Family History  Problem Relation Age of Onset   Hypertension Mother    Diabetes Mother    Varicose Veins Mother    Hyperlipidemia Mother    Cancer Mother    Varicose Veins Father     Cancer Father    Hypertension Sister    Diabetes Sister    Thyroid disease Sister    Varicose Veins Brother    Hypertension Brother    Hypertension Brother    Varicose Veins Brother    Healthy Daughter    Migraines Daughter    Colon cancer Neg Hx    Esophageal cancer Neg Hx    Rectal cancer Neg Hx    Stomach cancer Neg Hx    Breast cancer Neg Hx    Past Surgical History:  Procedure Laterality Date   BROW LIFT  03/11/2023   COLONOSCOPY     ORIF WRIST FRACTURE Left 04/12/2020   Procedure: OPEN REDUCTION INTERNAL FIXATION (ORIF) WRIST FRACTURE and repair reconstruction as indicated;  Surgeon: Bradly Bienenstock, MD;  Location: [facility] OR;  Service: Orthopedics;  Laterality: Left;  with IV sedation  Needs 2 hours   WISDOM TOOTH EXTRACTION     Social History   Social History Narrative   Not on file   Immunization History  Administered Date(s) Administered   Influenza,inj,Quad PF,6+ Mos 07/16/2019, 07/28/2020, 07/20/2021   Influenza-Unspecified 07/17/2017   PFIZER(Purple Top)SARS-COV-2 Vaccination 09/30/2019, 10/20/2019, 04/05/2020, 07/21/2020   Zoster Recombinat (Shingrix) 05/12/2018, 07/20/2018     Objective: Vital Signs: BP 109/71 (BP Location: Left Arm, Patient Position: Sitting, Cuff Size: Normal)   Pulse (!) 56   Resp 15   Ht 5' 3.5" (1.613 m)   Wt 197 lb 9.6 oz (89.6 kg)   LMP 07/23/2013   BMI 34.45 kg/m    Physical Exam Vitals and nursing note reviewed.  Constitutional:      Appearance: She is well-developed.  HENT:     Head: Normocephalic and atraumatic.  Eyes:     Conjunctiva/sclera: Conjunctivae normal.  Cardiovascular:     Rate and Rhythm: Normal rate and regular rhythm.     Heart sounds: Normal heart sounds.  Pulmonary:     Effort: Pulmonary effort is normal.     Breath sounds: Normal breath sounds.  Abdominal:     General: Bowel sounds are normal.     Palpations: Abdomen is soft.  Musculoskeletal:     Cervical back: Normal range of motion.   Lymphadenopathy:     Cervical: No cervical adenopathy.  Skin:    General: Skin is warm and dry.     Capillary Refill: Capillary refill takes less than 2 seconds.  Neurological:     Mental Status: She is alert and oriented to person, place, and time.  Psychiatric:        Behavior: Behavior normal.      Musculoskeletal Exam: Cervical, thoracic and lumbar spine were in good range of motion.  She had thoracic kyphosis.  Shoulders, elbows, wrist joints, MCPs PIPs and DIPs were in good range of motion except left wrist joint which had limited extension.  No synovitis was noted.  Hip joints, knee joints, ankles, MTPs and PIPs were in good range of motion with no synovitis.  CDAI Exam: CDAI Score: -- Patient Global: --; Provider Global: -- Swollen: --; Tender: -- Joint Exam 03/18/2023  No joint exam has been documented for this visit   There is currently no information documented on the homunculus. Go to the Rheumatology activity and complete the homunculus joint exam.  Investigation: No additional findings.  Imaging: No results found.  Recent Labs: Lab Results  Component Value Date   WBC 5.5 04/17/2022   HGB 14.1 04/17/2022   PLT 234 04/17/2022   NA 140 12/24/2022   K 4.3 12/24/2022   CL 102 12/24/2022   CO2 23 12/24/2022   GLUCOSE 75 12/24/2022   BUN 23 12/24/2022   CREATININE 0.84 12/24/2022   BILITOT 0.6 12/24/2022   ALKPHOS 68 12/24/2022   AST 21 12/24/2022   ALT 13 12/24/2022   PROT 6.6 12/24/2022   ALBUMIN 4.4 12/24/2022   CALCIUM 9.3 12/24/2022   GFRAA 109 04/16/2021    Speciality Comments: No specialty comments available.  Procedures:  No procedures performed Allergies: E-mycin [erythromycin] and Sulfa antibiotics   Assessment / Plan:     Visit Diagnoses: Raynaud's phenomenon without gangrene - History of severe Raynauds since in her 30s.  Autoimmune workup negative May 01, 2021.  She takes Norvasc during the winter months.  She has noticed improvement  on Norvasc.  She has been also using warm gloves and hand warmers.  Summer months or not bad for her.  No nailbed capillary changes, sclerodactyly or Telangiectasia were noted today.  Patient denies any history of oral ulcers, nasal ulcers, malar rash, photosensitivity, inflammatory arthritis or lymphadenopathy.  She denies any history of shortness of breath or palpitations.  She was advised to contact surgeons if she develops any new symptoms.  Primary osteoarthritis of both hands-she complains of some stiffness in her bilateral hands.  No PIP and DIP thickening was noted.  No synovitis was noted.  S/P ORIF (open reduction internal fixation) fracture - Left wrist July 2021.  She did well after PT and OT.  She has limited extension but doing well.  Trochanteric bursitis, left hip-she has intermittent trochanteric bursitis.  IT band stretches were encouraged.  Osteopenia of multiple sites - 06/07/23The BMD measured at [facility] L1-L3 is 0.994 g/cm2 with a T-score of -1.5.  Use of calcium and vitamin D was discussed.  Need for regular exercise was discussed.  History of anxiety  Stress incontinence in female  OSA (obstructive sleep apnea) -she was recently dxd by Dr. Sunday Shams.  She is using CPAP now.  BMI 34.0-34.9,adult-patient was referred to weight management clinic by Dr. Allyne Gee.  Orders: No orders of the defined types were placed in this encounter.  No orders of the defined types were placed in this encounter.    Follow-Up Instructions: Return in about 1 year (around 03/17/2024) for raynauds ,OA.   Pollyann Savoy, MD  Note - This record has been created using Animal nutritionist.  Chart creation errors have been sought, but may not always  have been located. Such creation errors do not reflect on  the standard of medical care.

## 2023-03-11 ENCOUNTER — Ambulatory Visit (INDEPENDENT_AMBULATORY_CARE_PROVIDER_SITE_OTHER): Payer: Commercial Managed Care - PPO | Admitting: Bariatrics

## 2023-03-11 ENCOUNTER — Encounter: Payer: Self-pay | Admitting: Bariatrics

## 2023-03-11 ENCOUNTER — Other Ambulatory Visit (HOSPITAL_COMMUNITY): Payer: Self-pay

## 2023-03-11 VITALS — BP 125/72 | HR 68 | Temp 98.2°F | Ht 64.0 in | Wt 195.0 lb

## 2023-03-11 DIAGNOSIS — E88819 Insulin resistance, unspecified: Secondary | ICD-10-CM

## 2023-03-11 DIAGNOSIS — Z6833 Body mass index (BMI) 33.0-33.9, adult: Secondary | ICD-10-CM

## 2023-03-11 DIAGNOSIS — E669 Obesity, unspecified: Secondary | ICD-10-CM | POA: Diagnosis not present

## 2023-03-11 DIAGNOSIS — R632 Polyphagia: Secondary | ICD-10-CM

## 2023-03-11 HISTORY — PX: BROW LIFT: SHX178

## 2023-03-11 MED ORDER — QSYMIA 3.75-23 MG PO CP24
ORAL_CAPSULE | ORAL | 0 refills | Status: DC
Start: 2023-03-11 — End: 2023-04-15
  Filled 2023-03-11: qty 14, 14d supply, fill #0

## 2023-03-11 MED ORDER — QSYMIA 7.5-46 MG PO CP24
ORAL_CAPSULE | ORAL | 0 refills | Status: DC
Start: 2023-03-11 — End: 2023-04-15
  Filled 2023-03-11: qty 30, 30d supply, fill #0

## 2023-03-11 NOTE — Progress Notes (Unsigned)
Chief Complaint:   OBESITY Victoria Herman is here to discuss her progress with her obesity treatment plan along with follow-up of her obesity related diagnoses. Victoria Herman is on {MWMwtlossportion/plan2:23431} and states she is following her eating plan approximately ***% of the time. Victoria Herman states she is *** *** minutes *** times per week.  Today's visit was #: *** Starting weight: *** Starting date: *** Today's weight: *** Today's date: 03/11/2023 Total lbs lost to date: *** Total lbs lost since last in-office visit: ***  Interim History: ***  Subjective:   1. Insulin resistance ***  2. Polyphagia ***  Assessment/Plan:   1. Insulin resistance *** - Phentermine-Topiramate (QSYMIA) 3.75-23 MG CP24; Take 1 capsule daily  Dispense: 14 capsule; Refill: 0 - Phentermine-Topiramate (QSYMIA) 7.5-46 MG CP24; Take 1 capsule daily in the am.  Dispense: 30 capsule; Refill: 0  2. Polyphagia *** - Phentermine-Topiramate (QSYMIA) 3.75-23 MG CP24; Take 1 capsule daily  Dispense: 14 capsule; Refill: 0 - Phentermine-Topiramate (QSYMIA) 7.5-46 MG CP24; Take 1 capsule daily in the am.  Dispense: 30 capsule; Refill: 0  3. Generalized obesity  4. BMI 33.0-33.9,adult Victoria Herman {CHL AMB IS/IS NOT:[id number]} currently in the action stage of change. As such, her goal is to {MWMwtloss#1:210800005}. She has agreed to {MWMwtlossportion/plan2:23431}.   Exercise goals: {MWM EXERCISE RECS:23473}  Behavioral modification strategies: {MWMwtlossdietstrategies3:23432}.  Victoria Herman has agreed to follow-up with our clinic in {NUMBER 1-10:22536} weeks. She was informed of the importance of frequent follow-up visits to maximize her success with intensive lifestyle modifications for her multiple health conditions.   Objective:   Blood pressure 125/72, pulse 68, temperature 98.2 F (36.8 C), height 5\' 4"  (1.626 m), weight 195 lb (88.5 kg), last menstrual period 07/23/2013, SpO2 100 %. Body mass index is 33.47  kg/m.  General: Cooperative, alert, well developed, in no acute distress. HEENT: Conjunctivae and lids unremarkable. Cardiovascular: Regular rhythm.  Lungs: Normal work of breathing. Neurologic: No focal deficits.   Lab Results  Component Value Date   CREATININE 0.84 12/24/2022   BUN 23 12/24/2022   NA 140 12/24/2022   K 4.3 12/24/2022   CL 102 12/24/2022   CO2 23 12/24/2022   Lab Results  Component Value Date   ALT 13 12/24/2022   AST 21 12/24/2022   ALKPHOS 68 12/24/2022   BILITOT 0.6 12/24/2022   Lab Results  Component Value Date   HGBA1C 5.5 04/16/2021   HGBA1C 5.6 03/29/2019   Lab Results  Component Value Date   INSULIN 11.3 02/20/2023   Lab Results  Component Value Date   TSH 2.210 12/24/2022   Lab Results  Component Value Date   CHOL 199 12/24/2022   HDL 89 12/24/2022   LDLCALC 96 12/24/2022   TRIG 77 12/24/2022   CHOLHDL 2.2 12/24/2022   Lab Results  Component Value Date   VD25OH 64.8 02/20/2023   VD25OH 99.5 12/24/2022   VD25OH 70.4 04/16/2021   Lab Results  Component Value Date   WBC 5.5 04/17/2022   HGB 14.1 04/17/2022   HCT 42.4 04/17/2022   MCV 89 04/17/2022   PLT 234 04/17/2022   Lab Results  Component Value Date   IRON 26 04/16/2021   FERRITIN 86 04/16/2021   Attestation Statements:   Reviewed by clinician on day of visit: allergies, medications, problem list, medical history, surgical history, family history, social history, and previous encounter notes.   Trude Mcburney, am acting as Energy manager for [employer], DO.  I have reviewed the above documentation for  accuracy and completeness, and I agree with the above. -  ***

## 2023-03-12 ENCOUNTER — Other Ambulatory Visit (HOSPITAL_COMMUNITY): Payer: Self-pay

## 2023-03-12 ENCOUNTER — Encounter: Payer: Self-pay | Admitting: Bariatrics

## 2023-03-12 MED ORDER — TRIAZOLAM 0.25 MG PO TABS
0.2500 mg | ORAL_TABLET | ORAL | 0 refills | Status: DC
  Filled 2023-03-12: qty 3, 3d supply, fill #0

## 2023-03-15 ENCOUNTER — Other Ambulatory Visit (HOSPITAL_COMMUNITY): Payer: Self-pay

## 2023-03-16 DIAGNOSIS — G4733 Obstructive sleep apnea (adult) (pediatric): Secondary | ICD-10-CM | POA: Diagnosis not present

## 2023-03-17 ENCOUNTER — Other Ambulatory Visit: Payer: Self-pay

## 2023-03-17 ENCOUNTER — Other Ambulatory Visit (HOSPITAL_COMMUNITY): Payer: Self-pay

## 2023-03-18 ENCOUNTER — Encounter: Payer: Self-pay | Admitting: Rheumatology

## 2023-03-18 ENCOUNTER — Ambulatory Visit: Payer: Commercial Managed Care - PPO | Attending: Rheumatology | Admitting: Rheumatology

## 2023-03-18 VITALS — BP 109/71 | HR 56 | Resp 15 | Ht 63.5 in | Wt 197.6 lb

## 2023-03-18 DIAGNOSIS — M19042 Primary osteoarthritis, left hand: Secondary | ICD-10-CM

## 2023-03-18 DIAGNOSIS — M8589 Other specified disorders of bone density and structure, multiple sites: Secondary | ICD-10-CM

## 2023-03-18 DIAGNOSIS — M7062 Trochanteric bursitis, left hip: Secondary | ICD-10-CM | POA: Diagnosis not present

## 2023-03-18 DIAGNOSIS — N393 Stress incontinence (female) (male): Secondary | ICD-10-CM

## 2023-03-18 DIAGNOSIS — Z9889 Other specified postprocedural states: Secondary | ICD-10-CM

## 2023-03-18 DIAGNOSIS — G4733 Obstructive sleep apnea (adult) (pediatric): Secondary | ICD-10-CM | POA: Diagnosis not present

## 2023-03-18 DIAGNOSIS — Z8659 Personal history of other mental and behavioral disorders: Secondary | ICD-10-CM

## 2023-03-18 DIAGNOSIS — Z6834 Body mass index (BMI) 34.0-34.9, adult: Secondary | ICD-10-CM

## 2023-03-18 DIAGNOSIS — I7300 Raynaud's syndrome without gangrene: Secondary | ICD-10-CM

## 2023-03-18 DIAGNOSIS — M19041 Primary osteoarthritis, right hand: Secondary | ICD-10-CM | POA: Diagnosis not present

## 2023-03-18 DIAGNOSIS — M533 Sacrococcygeal disorders, not elsewhere classified: Secondary | ICD-10-CM

## 2023-03-18 DIAGNOSIS — Z8781 Personal history of (healed) traumatic fracture: Secondary | ICD-10-CM

## 2023-03-18 NOTE — Patient Instructions (Signed)
Raynaud's Phenomenon  Raynaud's phenomenon is a condition that affects the blood vessels (arteries) that carry blood to the fingers and toes. The arteries that supply blood to the ears, lips, nipples, or the tip of the nose might also be affected. Raynaud's phenomenon causes the arteries to become narrow temporarily (spasm). As a result, the flow of blood to the affected areas is temporarily decreased. This usually occurs in response to cold temperatures or stress. During an attack, the skin in the affected areas turns white, then blue, and finally red. A person may also feel tingling or numbness in those areas. Attacks usually last for only a brief period, and then the blood flow to the area returns to normal. In most cases, Raynaud's phenomenon does not cause serious health problems. What are the causes? In many cases, the cause of this condition is not known. The condition may occur on its own (primary Raynaud's phenomenon) or may be associated with other diseases or factors (secondary Raynaud's phenomenon). Possible causes may include: Diseases or medical conditions that damage the arteries. Injuries and repetitive actions that hurt the hands or feet. Being exposed to certain chemicals. Taking medicines that narrow the arteries. Other medical conditions, such as lupus, scleroderma, rheumatoid arthritis, thyroid problems, blood disorders, Sjogren syndrome, or atherosclerosis. What increases the risk? The following factors may make you more likely to develop this condition: Being 20-40 years old. Being female. Having a family history of Raynaud's phenomenon. Living in a cold climate. Smoking. What are the signs or symptoms? Symptoms of this condition usually occur when you are exposed to cold temperatures or when you have emotional stress. The symptoms may last for a few minutes or up to several hours. They usually affect your fingers but may also affect your toes, nipples, lips, ears, or the  tip of your nose. Symptoms may include: Changes in skin color. The skin in the affected areas will turn pale or white. The skin may then change from white to bluish to red as normal blood flow returns to the area. Numbness, tingling, or pain in the affected areas. In severe cases, symptoms may include: Skin sores. Tissues decaying and dying (gangrene). How is this diagnosed? This condition may be diagnosed based on: Your symptoms and medical history. A physical exam. During the exam, you may be asked to put your hands in cold water to check for a reaction to cold temperature. Tests, such as: Blood tests to check for other diseases or conditions. A test to check the movement of blood through your arteries and veins (vascular ultrasound). A test in which the skin at the base of your fingernail is examined under a microscope (nailfold capillaroscopy). How is this treated? During an episode, you can take actions to help symptoms go away faster. Options include moving your arms around in a windmill pattern, warming your fingers under warm water, or placing your fingers in a warm body fold, such as your armpit. Long-term treatment for this condition often involves making lifestyle changes and taking steps to control your exposure to cold temperature. For more severe cases, medicine (calcium channel blockers) may be used to improve blood circulation. Follow these instructions at home: Avoiding cold temperatures Take these steps to avoid exposure to cold: If possible, stay indoors during cold weather. When you go outside during cold weather, dress in layers and wear mittens, a hat, a scarf, and warm footwear. Wear mittens or gloves when handling ice or frozen food. Use holders for glasses or cans containing   cold drinks. Let warm water run for a while before taking a shower or bath. Warm up the car before driving in cold weather. Lifestyle If possible, avoid stressful and emotional situations. Try  to find ways to manage your stress, such as: Exercise. Yoga. Meditation. Biofeedback. Do not use any products that contain nicotine or tobacco. These products include cigarettes, chewing tobacco, and vaping devices, such as e-cigarettes. If you need help quitting, ask your health care provider. Avoid secondhand smoke. Limit your use of caffeine. Switch to decaffeinated coffee, tea, and soda. Avoid chocolate. Avoid vibrating tools and machinery. General instructions Protect your hands and feet from injuries, cuts, or bruises. Avoid wearing tight rings or wristbands. Wear loose fitting socks and comfortable, roomy shoes. Take over-the-counter and prescription medicines only as told by your health care provider. Where to find support Raynaud's Association: www.raynauds.org Where to find more information National Institute of Arthritis and Musculoskeletal and Skin Diseases: www.niams.nih.gov Contact a health care provider if: Your discomfort becomes worse despite lifestyle changes. You develop sores on your fingers or toes that do not heal. You have breaks in the skin on your fingers or toes. You have a fever. You have pain or swelling in your joints. You have a rash. Your symptoms occur on only one side of your body. Get help right away if: Your fingers or toes turn black. You have severe pain in the affected areas. These symptoms may represent a serious problem that is an emergency. Do not wait to see if the symptoms will go away. Get medical help right away. Call your local emergency services (911 in the [place] Do not drive yourself to the hospital. Summary Raynaud's phenomenon is a condition that affects the arteries that carry blood to the fingers, toes, ears, lips, nipples, or the tip of the nose. In many cases, the cause of this condition is not known. Symptoms of this condition include changes in skin color along with numbness and tingling in the affected area. Treatment for  this condition includes lifestyle changes and reducing exposure to cold temperatures. Medicines may be used for severe cases of the condition. Contact your health care provider if your condition worsens despite treatment. This information is not intended to replace advice given to you by your health care provider. Make sure you discuss any questions you have with your health care provider. Document Revised: 11/28/2020 Document Reviewed: 11/28/2020 Elsevier Patient Education  2024 [employer]  

## 2023-03-19 ENCOUNTER — Telehealth: Payer: Self-pay

## 2023-03-19 DIAGNOSIS — J3089 Other allergic rhinitis: Secondary | ICD-10-CM | POA: Diagnosis not present

## 2023-03-19 NOTE — Telephone Encounter (Signed)
PA started for Qsymia via covermymeds 

## 2023-03-24 ENCOUNTER — Other Ambulatory Visit (HOSPITAL_COMMUNITY): Payer: Self-pay

## 2023-03-24 NOTE — Telephone Encounter (Signed)
Qsymia denied due to insurance requires previous trial of or contraindcation to saxenda, wegovy and zepbound.

## 2023-03-31 ENCOUNTER — Other Ambulatory Visit (HOSPITAL_COMMUNITY): Payer: Self-pay

## 2023-04-01 ENCOUNTER — Encounter: Payer: Self-pay | Admitting: Nurse Practitioner

## 2023-04-01 ENCOUNTER — Ambulatory Visit (INDEPENDENT_AMBULATORY_CARE_PROVIDER_SITE_OTHER): Payer: Commercial Managed Care - PPO | Admitting: Nurse Practitioner

## 2023-04-01 VITALS — BP 123/74 | HR 62 | Temp 98.1°F | Ht 64.0 in | Wt 197.0 lb

## 2023-04-01 DIAGNOSIS — R632 Polyphagia: Secondary | ICD-10-CM

## 2023-04-01 DIAGNOSIS — Z6833 Body mass index (BMI) 33.0-33.9, adult: Secondary | ICD-10-CM | POA: Diagnosis not present

## 2023-04-01 DIAGNOSIS — E669 Obesity, unspecified: Secondary | ICD-10-CM | POA: Diagnosis not present

## 2023-04-01 NOTE — Patient Instructions (Signed)
Steps to starting your start Contrave  The office will send a prior authorization request to your insurance company for approval. We will send you a mychart message once we hear back from your insurance with a decision.  This can take up to 7-10 business days.   Is Contrave an option for me? Do not take Contrave if you:   Have uncontrolled hypertension  Have or have had seizures  Use other medicines that contain bupropion such as Wellbutrin, Wellbutrin SR, Wellbutrin XL, and Aplenzin.  Have or have had an eating disorder called anorexia (eating very little) or bulimia (eating too much and vomiting to avoid gaining weight)  Are dependent on opioid pain medicines or use medicines to help stop taking opioids such as methadone or buprenorphine, or are in opiate withdrawal  Drink a lot of alcohol and abruptly stop drinking, or use medicines called sedatives (these make you sleepy), benzodiazepines, or anti-seizure medicines and you stop using them all of a sudden  Are taking medicines called monoamine oxidase inhibitors (MAOIs). Ask your healthcare provider or pharmacist if you are not sure if you take an MAOI, including linezoid. Do not start Contrave until you have stopped taking your MAOI for at least 14 days.  Are allergic to naltrexone HCl or bupropion HCl or any of the ingredients in Contrave. See the end of this Medication Guide for a complete list of ingredients in Contrave.  Are pregnant or planning to become pregnant. Tell your healthcare provider right away if you become pregnant while taking CONTRAVE  What is Contrave and how does it work?  Contrave is a prescription only medicine to help with your weight loss. It works on an area of the brain that controls your appetite.  It is a combination of two medicines that are extended release: naltrexone HCL and bupropion HCL.  One of the ingredients in Contrave, bupropion, is the same ingredient in some other medicines used to treat  depression and to help people quit smoking. However, Contrave is not approved to treat depression or other mental illnesses, or to help people quit smoking (smoking cessation).   This medicine will be most effective when combined with a reduced calorie diet and physical activity.  How should I take Contrave?  Take exactly as your provider tells you to. It is an increasing dose according to the following chart:  Contrave How should I take Contrave?   It is best to take Contrave with food. However, do not take with high-fat meals.   Swallow the extended-release tablet whole. Do not crush, break, or chew it.   If you miss a dose, skip the missed dose and go back to your regular dosing schedule. Do not take extra medicine to make up for a missed dose.  Do not take more than 2 tablets in the morning and 2 tablets in the evening.  Do not take more than 2 tablets at the same time or more than 4 tablets in 1 day.  Do not stop taking Contrave without talking to your provider.   Stopping Contrave suddenly can cause serious side effects, such as seizures.  Swallow Contrave tablets whole. Do not cut, chew, or crush tablets.  Do not take Contrave when eating a high-fat meal. It may increase your risk of seizures.   What should I avoid while taking Contrave?  You should avoid other medicines that contain bupropion such as Wellbutrin, Wellbutrin SR, Wellbutrin XL and Aplenzin.  You should avoid opioid pain medications or medicines   to help stop taking opioids such as methadone or buprenorphine. There may be a risk of opioid overdose.  Do not drink alcohol while taking Contrave. If you drink alcohol, talk to your provider before starting Contrave.   Avoid eating a high-fat meal at the same time you take Contrave. It may increase your risk of seizures.   Women who can become pregnant: Use effective birth control (contraception) consistently while taking Contrave. If you miss a menstrual period,  STOP Contrave and call our office immediately. Monthly pregnancy tests will be performed at your appointment if indicated.  What side effects may I notice when taking Contrave?  Side effects that usually do not require medical attention (report to our office if they continue or are bothersome): o Nausea o Constipation (you may take over the counter laxative if needed) o Headache o Dry mouth (drink at least 64 oz. of fluid daily) o Trouble sleeping (insomnia) o Vomiting o Dizziness o Diarrhea   Side effects that you should report to our office as soon as possible: o Seizures: DO NOT take Contrave again o Depression or severe changes in mood o Thoughts about suicide or dying o Feeling anxious, agitated, irritable, restless, or nervous o Slowed breathing and/or shallow breathing o Severe drowsiness o Confusion o Signs of allergic reaction such as rash, itching, hives, fever, swollen lymph glands, painful sores in your mouth or around your eyes, swelling of your lips or tongue, chest pain, or trouble breathing o Increased blood pressure o Increased heart rate or palpitations o Stomach pain lasting more than a few days o Dark urine o Yellowing of the whites of your eyes o Severe tiredness o Sudden changes in vision o Swelling or redness in or around the eye  o Low blood sugar in Type 2 Diabetes.   Other important information  While taking CONTRAVE, you or your family members should pay close attention to any changes, especially sudden changes, in mood, behaviors, thoughts, or feelings and maintain communication with your provider. You will be asked to sign an informed consent prior to starting Contrave.  If another healthcare provider prescribes narcotic pain medication for you, please call our office immediately for advice regarding Contrave.  Your prescription will be sent electronically to your pharmacy.   Refills will require an office visit  What is Qsymia and how does it  work?  Qsymia is a prescription only medicine to help with your weight loss. It is a combination of two medicines that are low dose, long-acting: Phentermine & Topiramate. Qsymia contains low dose Phentermine which is a stimulant medicine that could affect your heart rate and blood pressure Qsymia is designed to help you feel satisfied faster that will help you to decrease portion size. Also, it helps curb late night snacking habits. Some food you usually enjoy may start to taste differently which will help you make healthier food choices.  This medicine will be most effective when combined with a reduced calorie diet and physical activity.  How should I take Qsymia? Take daily in the morning with breakfast. Swallow the extended-release capsule whole. Do not crush, break, or chew it.  If you miss a dose, take it as soon as possible. If it is after 12pm, skip the missed dose and go back to your regular dosing schedule. Do not take extra medicine to make up for the missed dose. You have received two separate prescriptions today. You will initially take a lower dose of 3.75mg/23mg for 14 days then increase   to a higher dose of 7.5mg/46mg for maintenance for 30 days. There are 4 total dosing options. Your provider will discuss any need to go up to a higher dose during your office visits.  If you are taking Levothyroxine, take the Levothyroxine 1 hours before breakfast and the take the Qsymia 1 hour after breakfast. Do not stop taking Qsymia without talking to your provider. Stopping Qsymia suddenly can cause serious side effects, such as seizures and headaches.   What should I avoid while taking Qsymia? Limit caffeine to 1 small cup daily. Examples are soda, coffee, tea, herbal tea, energy drinks, and chocolate Avoid decongestant medicines like Sudafed, Mucinex-D, and Zyrtec-D. Qsymia may cause you to feel dizzy, drowsy, or confused, or to have trouble thinking or speaking. Do not drive or do  anything else that could be dangerous until you know how this medicine affects you.  Women who can become pregnant: Use effective birth control (contraception) consistently while taking Qsymia. If you miss a menstrual period, STOP Qsymia and call our office immediately. Pregnancy tests will be performed at your appointment if indicated.  What side effects may I notice when taking Qsymia? Side effects that usually do not require medical attention (report to our office if they continue or are bothersome): Dry mouth (drink at least 64 oz of fluid daily) Constipation (you may take over the counter laxative if needed) Metallic taste in your mouth when drinking carbonated beverages Numbness or tingling in the hands, arms, feet or face that lasts more than a week Headache Sudden changes in vision Mental fuzziness (problems with concentration, attention, memory or speech) Trouble sleeping (insomnia) Side effects that you should report to our office as soon as possible: Increases in heart rate and/or palpitations (feeling like your heart is racing or pounding in your chest that lasts several minutes) Chest pain Increased blood pressure Dizziness or feeling faint Shortness of breath Irritability Feeling anxious, agitated, restless, or nervous Depression or severe changes in mood Problems urinating Unusual swelling of the legs Vomiting   Other important information You will be asked to sign an informed consent prior to starting Qysmia Qsymia is a federally controlled substance. Keep Qsymia in a safe place to prevent misuse and abuse. Selling or giving away Qsymia may harm others, and is against the law.  Your prescription will be sent to the pharmacy during your visit.  Refills will require an office visit. Your insurance may not cover the cost of this medicine. Our office will complete a pre-authorization if required by your insurance.  

## 2023-04-01 NOTE — Progress Notes (Signed)
Office: [phone]  /  Fax: [phone]  WEIGHT SUMMARY AND BIOMETRICS  No data recorded Weight Gained Since Last Visit: 2lb   Vitals Temp: 98.1 F (36.7 C) BP: 123/74 Pulse Rate: 62 SpO2: 99 %   Anthropometric Measurements Height: 5\' 4"  (1.626 m) Weight: 197 lb (89.4 kg) BMI (Calculated): 33.8 Weight at Last Visit: 195lb Weight Gained Since Last Visit: 2lb Starting Weight: 196lb Total Weight Loss (lbs): 0 lb (0 kg)   Body Composition  Body Fat %: 46 % Fat Mass (lbs): 90.8 lbs Muscle Mass (lbs): 101 lbs Total Body Water (lbs): 78 lbs Visceral Fat Rating : 13   Other Clinical Data Fasting: No Labs: No Today's Visit #: 3 Starting Date: 02/20/23     HPI  Chief Complaint: OBESITY  Victoria Herman is here to discuss her progress with her obesity treatment plan. She is on the the Category 2 Plan and states she is following her eating plan approximately 75 % of the time. She states she is exercising 0 minutes 0 days per week.  Interval History:  Since last office visit she has gained 2 pounds.  She is working on meeting her protein goals.  Struggling with hunger and cravings.  Her family visited her recently and she had a few meals off track. She is drinking water, sparkling water and 1 diet soda.  No upcoming vacations.    Pharmacotherapy for weight loss: She is not currently taking medications  for medical weight loss.  Was prescribed Qsymia after last office visit but hasn't started due to cost.      Previous pharmacotherapy for medical weight loss:  Qsymia  Bariatric surgery:  Patient has not had bariatric surgery.    PHYSICAL EXAM:  Blood pressure 123/74, pulse 62, temperature 98.1 F (36.7 C), height 5\' 4"  (1.626 m), weight 197 lb (89.4 kg), last menstrual period 07/23/2013, SpO2 99 %. Body mass index is 33.81 kg/m.  General: She is overweight, cooperative, alert, well developed, and in no acute distress. PSYCH: Has normal mood, affect and thought  process.   Extremities: No edema.  Neurologic: No gross sensory or motor deficits. No tremors or fasciculations noted.    DIAGNOSTIC DATA REVIEWED:  BMET    Component Value Date/Time   NA 140 12/24/2022 1714   K 4.3 12/24/2022 1714   CL 102 12/24/2022 1714   CO2 23 12/24/2022 1714   GLUCOSE 75 12/24/2022 1714   BUN 23 12/24/2022 1714   CREATININE 0.84 12/24/2022 1714   CALCIUM 9.3 12/24/2022 1714   GFRNONAA 90 04/16/2021 0000   GFRAA 109 04/16/2021 0000   Lab Results  Component Value Date   HGBA1C 5.5 04/16/2021   HGBA1C 5.6 03/29/2019   Lab Results  Component Value Date   INSULIN 11.3 02/20/2023   Lab Results  Component Value Date   TSH 2.210 12/24/2022   CBC    Component Value Date/Time   WBC 5.5 04/17/2022 1237   RBC 4.79 04/17/2022 1237   RBC 4.72 04/16/2021 0000   HGB 14.1 04/17/2022 1237   HCT 42.4 04/17/2022 1237   PLT 234 04/17/2022 1237   MCV 89 04/17/2022 1237   MCH 29.4 04/17/2022 1237   MCHC 33.3 04/17/2022 1237   RDW 12.9 04/17/2022 1237   Iron Studies    Component Value Date/Time   IRON 26 04/16/2021 0000   FERRITIN 86 04/16/2021 0000   Lipid Panel     Component Value Date/Time   CHOL 199 12/24/2022 1714   TRIG  77 12/24/2022 1714   HDL 89 12/24/2022 1714   CHOLHDL 2.2 12/24/2022 1714   LDLCALC 96 12/24/2022 1714   Hepatic Function Panel     Component Value Date/Time   PROT 6.6 12/24/2022 1714   ALBUMIN 4.4 12/24/2022 1714   AST 21 12/24/2022 1714   ALT 13 12/24/2022 1714   ALKPHOS 68 12/24/2022 1714   BILITOT 0.6 12/24/2022 1714      Component Value Date/Time   TSH 2.210 12/24/2022 1714   Nutritional Lab Results  Component Value Date   VD25OH 64.8 02/20/2023   VD25OH 99.5 12/24/2022   VD25OH 70.4 04/16/2021     ASSESSMENT AND PLAN  TREATMENT PLAN FOR OBESITY:  Recommended Dietary Goals  Victoria Herman is currently in the action stage of change. As such, her goal is to continue weight management plan. She has agreed to  the Category 2 Plan.  Behavioral Intervention  We discussed the following Behavioral Modification Strategies today: increasing lean protein intake, decreasing simple carbohydrates , increasing vegetables, increasing lower glycemic fruits, increasing fiber rich foods, avoiding skipping meals, increasing water intake, continue to practice mindfulness when eating, and planning for success.  Additional resources provided today: information given on Qsymia and Contrave. If unable to take Qsymia due to cost, will prescribe Contrave (patient to let me know).  Recommended Physical Activity Goals  Victoria Herman has been advised to work up to 150 minutes of moderate intensity aerobic activity a week and strengthening exercises 2-3 times per week for cardiovascular health, weight loss maintenance and preservation of muscle mass.   She has agreed to Think about ways to increase daily physical activity and overcoming barriers to exercise and Increase physical activity in their day and reduce sedentary time (increase NEAT).   Pharmacotherapy We discussed various medication options to help Victoria Herman with her weight loss efforts and we both agreed to start Qsymia dose 2.  I think the PA was denied because it was submitted under [employer]icar.  Will run PA under [employer].  ASSOCIATED CONDITIONS ADDRESSED TODAY  Action/Plan  Polyphagia Plan to start Qsymia dose 2.  Will re submit PA.   Generalized obesity  BMI 33.0-33.9,adult         Return in about 2 weeks (around 04/15/2023).Marland Kitchen She was informed of the importance of frequent follow up visits to maximize her success with intensive lifestyle modifications for her multiple health conditions.   ATTESTASTION STATEMENTS:  Reviewed by clinician on day of visit: allergies, medications, problem list, medical history, surgical history, family history, social history, and previous encounter notes.   Time spent on visit including pre-visit chart review and post-visit  care and charting was 30 minutes.    Theodis Sato. Victoria Giarrusso FNP-C

## 2023-04-02 ENCOUNTER — Telehealth: Payer: Self-pay

## 2023-04-02 DIAGNOSIS — J3089 Other allergic rhinitis: Secondary | ICD-10-CM | POA: Diagnosis not present

## 2023-04-02 NOTE — Telephone Encounter (Signed)
PA submitted through [employer] for Qsymia.  Awaiting insurance determination. Key: [id number]

## 2023-04-03 ENCOUNTER — Other Ambulatory Visit (HOSPITAL_COMMUNITY): Payer: Self-pay

## 2023-04-03 ENCOUNTER — Other Ambulatory Visit: Payer: Self-pay

## 2023-04-03 MED ORDER — EPINEPHRINE 0.3 MG/0.3ML IJ SOAJ
0.3000 mg | INTRAMUSCULAR | 1 refills | Status: DC | PRN
  Filled 2023-04-03 (×2): qty 2, 2d supply, fill #0

## 2023-04-14 ENCOUNTER — Other Ambulatory Visit: Payer: Self-pay | Admitting: Internal Medicine

## 2023-04-14 DIAGNOSIS — Z1231 Encounter for screening mammogram for malignant neoplasm of breast: Secondary | ICD-10-CM

## 2023-04-15 ENCOUNTER — Encounter: Payer: Self-pay | Admitting: Bariatrics

## 2023-04-15 ENCOUNTER — Ambulatory Visit (INDEPENDENT_AMBULATORY_CARE_PROVIDER_SITE_OTHER): Payer: Commercial Managed Care - PPO | Admitting: Bariatrics

## 2023-04-15 ENCOUNTER — Other Ambulatory Visit (HOSPITAL_COMMUNITY): Payer: Self-pay

## 2023-04-15 VITALS — BP 125/77 | HR 65 | Temp 98.2°F | Ht 64.0 in | Wt 200.0 lb

## 2023-04-15 DIAGNOSIS — Z6834 Body mass index (BMI) 34.0-34.9, adult: Secondary | ICD-10-CM

## 2023-04-15 DIAGNOSIS — E7849 Other hyperlipidemia: Secondary | ICD-10-CM

## 2023-04-15 DIAGNOSIS — G4733 Obstructive sleep apnea (adult) (pediatric): Secondary | ICD-10-CM | POA: Diagnosis not present

## 2023-04-15 DIAGNOSIS — E669 Obesity, unspecified: Secondary | ICD-10-CM | POA: Diagnosis not present

## 2023-04-15 DIAGNOSIS — R632 Polyphagia: Secondary | ICD-10-CM

## 2023-04-15 MED ORDER — QSYMIA 7.5-46 MG PO CP24: ORAL_CAPSULE | ORAL | 0 refills | Status: DC

## 2023-04-15 NOTE — Progress Notes (Signed)
WEIGHT SUMMARY AND BIOMETRICS  Weight Gained Since Last Visit: 3lb   Vitals Temp: 98.2 F (36.8 C) BP: 125/77 Pulse Rate: 65 SpO2: 100 %   Anthropometric Measurements Height: 5\' 4"  (1.626 m) Weight: 200 lb (90.7 kg) BMI (Calculated): 34.31 Weight at Last Visit: 197lb Weight Gained Since Last Visit: 3lb Starting Weight: 196lb Total Weight Loss (lbs): 0 lb (0 kg)   Body Composition  Body Fat %: 46 % Fat Mass (lbs): 92.2 lbs Muscle Mass (lbs): 102.8 lbs Total Body Water (lbs): 77.4 lbs Visceral Fat Rating : 13   Other Clinical Data Fasting: no Labs: no Today's Visit #: 4 Starting Date: 02/20/23    OBESITY Victoria Herman is here to discuss her progress with her obesity treatment plan along with follow-up of her obesity related diagnoses.     Nutrition Plan: the Category 2 plan - 40% adherence.  Current exercise: walking  Interim History:  She is up 3 lbs. She has been cooking less and drinking less water.  Eating all of the food on the plan., Protein intake is less than prescribed., and Water intake is inadequate.  Pharmacotherapy:  Hunger is moderately controlled.  Cravings are moderately controlled. She has had some cravings for sweets.  Assessment/Plan:   Victoria Herman endorses excessive hunger.  Medication(s): None Effects of medication:  moderately controlled. Cravings are moderately controlled.   Plan: Medication(s): Initiate   Qsymia 7.5/46 mg 1 capsule by mouth daily in am Will increase water, protein and fiber to help assuage hunger.  Will minimize foods that have a high glucose index/load to minimize reactive hypoglycemia.    Generalized Obesity: Current BMI BMI (Calculated): 34.31   Hyperlipidemia LDL is at goal. Medication(s): Lipitor Cardiovascular risk factors: dyslipidemia, obesity (BMI >= 30 kg/m2), and sedentary lifestyle  Lab Results  Component Value Date   CHOL 199 12/24/2022   HDL 89 12/24/2022   LDLCALC 96  12/24/2022   TRIG 77 12/24/2022   CHOLHDL 2.2 12/24/2022   Lab Results  Component Value Date   ALT 13 12/24/2022   AST 21 12/24/2022   ALKPHOS 68 12/24/2022   BILITOT 0.6 12/24/2022   The 10-year ASCVD risk score (Arnett DK, et al., 2019) is: 2.3%   Values used to calculate the score:     Age: 62 years     Sex: Female     Is Non-Hispanic African American: No     Diabetic: No     Tobacco smoker: No     Systolic Blood Pressure: 125 mmHg     Is BP treated: No     HDL Cholesterol: 89 mg/dL     Total Cholesterol: 199 mg/dL  Plan:  Continue statin.  Information sheet on healthy vs unhealthy fats.  Will avoid all trans fats.  Will read labels Will minimize saturated fats except the following: low fat meats in moderation, diary, and limited dark chocolate.  Increase Omega 3 in foods, and consider an Omega 3 supplement.     Pharmacotherapy Plan Start  Qsymia 7.5/46 mg 1 capsule by mouth daily in am  Victoria Herman is not currently in the action stage of change. As such, her goal is to continue with weight loss efforts.  She has agreed to the Category 2 plan.  Exercise goals: For substantial health benefits, adults should do at least 150 minutes (2 hours and 30 minutes) a week of moderate-intensity, or 75 minutes (1 hour and 15 minutes) a week of vigorous-intensity aerobic physical activity, or an equivalent combination  of moderate- and vigorous-intensity aerobic activity. Aerobic activity should be performed in episodes of at least 10 minutes, and preferably, it should be spread throughout the week.  Behavioral modification strategies: increasing lean protein intake, no meal skipping, meal planning , increase water intake, increasing fiber rich foods, get rid of junk food in the home, and keep healthy foods in the home. Fiber sheet.   Victoria Herman has agreed to follow-up with our clinic in 2 weeks.   No orders of the defined types were placed in this encounter.   Medications Discontinued During  This Encounter  Medication Reason   Phentermine-Topiramate (QSYMIA) 3.75-23 MG CP24 Patient Preference   Phentermine-Topiramate (QSYMIA) 7.5-46 MG CP24 Patient Preference   EPINEPHrine (EPIPEN 2-PAK) 0.3 mg/0.3 mL IJ SOAJ injection Patient Preference      Objective:   VITALS: Per patient if applicable, see vitals. GENERAL: Alert and in no acute distress. CARDIOPULMONARY: No increased WOB. Speaking in clear sentences.  PSYCH: Pleasant and cooperative. Speech normal rate and rhythm. Affect is appropriate. Insight and judgement are appropriate. Attention is focused, linear, and appropriate.  NEURO: Oriented as arrived to appointment on time with no prompting.   Attestation Statements:    This was prepared with the assistance of Engineer, civil (consulting).  Occasional wrong-word or sound-a-like substitutions may have occurred due to the inherent limitations of voice recognition software.   Corinna Capra, DO

## 2023-04-28 ENCOUNTER — Encounter: Payer: 59 | Admitting: Internal Medicine

## 2023-04-29 ENCOUNTER — Encounter: Payer: Self-pay | Admitting: Neurology

## 2023-04-29 ENCOUNTER — Ambulatory Visit (INDEPENDENT_AMBULATORY_CARE_PROVIDER_SITE_OTHER): Payer: Commercial Managed Care - PPO | Admitting: Neurology

## 2023-04-29 VITALS — BP 123/72 | HR 68 | Ht 64.0 in | Wt 202.0 lb

## 2023-04-29 DIAGNOSIS — E669 Obesity, unspecified: Secondary | ICD-10-CM | POA: Diagnosis not present

## 2023-04-29 NOTE — Progress Notes (Signed)
Provider:  Melvyn Novas, MD  Primary Care Physician:  Dorothyann Peng, MD [address] [place] [address]     Referring Provider:   Dorothyann Peng, Md [address] [place],  [place] [address]          Chief Complaint according to patient   Patient presents with:     New Patient (Initial Visit)           HISTORY OF PRESENT ILLNESS:  Victoria Herman is a 62 y.o. female patient who is here for revisit 04/29/2023 for  new CPAP - The machine , cable and mask were not brought to this visit.  There is thankfully no need to trouble shoot. 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  Her average user time at night is 5 hours 40 minutes, she has been a 77% compliant patient -meaning that she use the machine 23 out of 30 days.  I not sure if this is actually valid observation because I can look back to May 14 6 when the patient was issued with a new machine and over that.  Of time she has definitely have a higher compliance than 77%.  She uses the machine she has an air leak of 26 L a minute and she has an average AHI or apnea hypopnea index of 1.9 events per hour for this is a significant reduction from over 30 apneas and hypopneas to less than 2/h.  She does not feel that it has affected sleepiness or fatigue but when she presented initially I was surprised how little sleepiness and fatigue she had reported.  So I hope that her husband is happy that her snoring will be treated this way . Epworth Sleepiness Scale was endorsed at 2 points out of 24 and her fatigue severity was  endorsed at 11 out of 63 and the geriatric depression scale was endorsed at 1 out of 15 points.    12-19-2022:   Patient of Dr Allyne Gee' seen for a sleep consultation..  Chief concern according to patient :  " I grind my teeth, I clench my jaw. I work 40 hours a week and then I take care of my autistic 23 year-old sister".    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    2) risk factors for OSA - are high grade Mallompatti, smaller crowded dentition, overbite.  3) BMI is elevated, but she lost 33 pounds and is fighting not to regain. Husband reported her snoring got worse.    Family medical /sleep history: Oldest Brother on CPAP with OSA, brother with hypersomnia. 2 siblings with essential tremor, father was affected. Sister with autism, functioning.   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  No pets, The patient  used to work in shifts( night/ rotating,) over a decade ago . She recently had a colonoscopy and was not told she had apnea.       Review of Systems: Out of a complete 14 system review, the patient complains of only the following symptoms, and all other reviewed systems are negative.:  How likely are you to doze in the following situations: 0 = not likely, 1 = slight chance, 2 = moderate chance, 3 = high chance   Sitting and Reading? Watching Television? Sitting inactive in a public place (theater or meeting)? As a passenger in a car for an hour without a break? Lying down in the afternoon when circumstances permit? Sitting and talking to someone? Sitting quietly after lunch without alcohol? In a car, while stopped for a few minutes in traffic?   Epworth Sleepiness Scale was endorsed at 2 points  out of 24 and her fatigue severity was endorsed at 11 out of 63 and the geriatric depression scale was endorsed at 1 out of 15 points. Social History   Socioeconomic History   Marital status: Married    Spouse name: Not on file   Number of children: Not on file   Years of education: Not on file   Highest education level: Bachelor's degree (e.g., BA, AB, BS)  Occupational History   Not on file  Tobacco Use   Smoking status: Never    Passive exposure: Past   Smokeless tobacco: Never  Vaping Use   Vaping status: Never Used  Substance and Sexual Activity   Alcohol use: Yes    Comment: occas.   Drug use: No   Sexual activity: Not on file  Other Topics Concern   Not on file  Social History Narrative   Not on file   Social Determinants of Health   Financial Resource Strain: Low Risk  (02/14/2022)   Overall Financial Resource Strain (CARDIA)    Difficulty of Paying Living Expenses: Not hard at all  Food Insecurity: No Food Insecurity (02/14/2022)   Hunger Vital Sign    Worried About Running Out of Food in the Last Year: Never true    Ran Out of Food in the Last Year: Never true  Transportation Needs: No Transportation Needs (02/14/2022)   PRAPARE - Administrator, Civil Service (Medical): No    Lack of Transportation (Non-Medical): No  Physical Activity: Sufficiently Active (02/14/2022)   Exercise Vital Sign    Days of Exercise per Week: 4 days    Minutes of Exercise per Session: 50 min  Stress: Stress Concern Present (02/14/2022)   [employer] of Occupational Health - Occupational Stress Questionnaire    Feeling of Stress : To some extent  Social Connections: Unknown (02/14/2022)   Social Connection and Isolation Panel [NHANES]    Frequency of Communication with Friends and Family: More than three times a week    Frequency of Social Gatherings with Friends and Family: Once a week    Attends Religious Services: More than 4 times per year    Active  Member of  Clubs or Organizations: Yes    Attends Engineer, structural: More than 4 times per year    Marital Status: Not on file    Family History  Problem Relation Age of Onset   Hypertension Mother    Diabetes Mother    Varicose Veins Mother    Hyperlipidemia Mother    Cancer Mother    Varicose Veins Father    Cancer Father    Hypertension Sister    Diabetes Sister    Thyroid disease Sister    Varicose Veins Brother    Hypertension Brother    Hypertension Brother    Varicose Veins Brother    Healthy Daughter    Migraines Daughter    Colon cancer Neg Hx    Esophageal cancer Neg Hx    Rectal cancer Neg Hx    Stomach cancer Neg Hx    Breast cancer Neg Hx     Past Medical History:  Diagnosis Date   Allergy    Anxiety    Constipation    GERD (gastroesophageal reflux disease)    Hyperlipidemia    Osteoarthritis    Seasonal allergies    Sleep apnea    Superficial thrombophlebitis 03-2014, 07-2014   Swallowing difficulty    Vitamin D deficiency     Past Surgical History:  Procedure Laterality Date   BROW LIFT  03/11/2023   COLONOSCOPY     ORIF WRIST FRACTURE Left 04/12/2020   Procedure: OPEN REDUCTION INTERNAL FIXATION (ORIF) WRIST FRACTURE and repair reconstruction as indicated;  Surgeon: Bradly Bienenstock, MD;  Location: [facility] OR;  Service: Orthopedics;  Laterality: Left;  with IV sedation  Needs 2 hours   WISDOM TOOTH EXTRACTION       Current Outpatient Medications on File Prior to Visit  Medication Sig Dispense Refill   amLODipine (NORVASC) 5 MG tablet Take 1 tablet (5 mg total) by mouth daily. 30 tablet 2   atorvastatin (LIPITOR) 20 MG tablet Take 1 tablet (20 mg total) by mouth daily Monday through Saturday, skip Sunday 90 tablet 2   Barberry-Oreg Grape-Goldenseal (BERBERINE COMPLEX PO) Take 2 tablets by mouth daily.     Calcium Carb-Cholecalciferol (CALCIUM 600 + D PO) Take 1 tablet by mouth daily. Vit D 12.5     cetirizine (ZYRTEC) 10 MG tablet Take 10 mg by  mouth daily.     Cholecalciferol (VITAMIN D3 SUPER STRENGTH) 50 MCG (2000 UT) CAPS Take 2,000 Units by mouth daily.     citalopram (CELEXA) 20 MG tablet Take 1 tablet (20 mg total) by mouth daily. 90 tablet 2   EPINEPHrine (EPIPEN 2-PAK) 0.3 mg/0.3 mL IJ SOAJ injection Inject 0.3 mg into the muscle as needed for systemic reaction 2 each 1   hydrocortisone 2.5 % cream Apply topically 2 (two) times daily. (Patient taking differently: Apply topically as needed.) 30 g 0   mometasone (NASONEX) 50 MCG/ACT nasal spray Use 2 sprays in each nostril once daily 17 g 5   Phentermine-Topiramate (QSYMIA) 7.5-46 MG CP24 Will take 1 daily 30 capsule 0   No current facility-administered medications on file prior to visit.    Allergies  Allergen Reactions   E-Mycin [Erythromycin] Diarrhea   Sulfa Antibiotics Rash     DIAGNOSTIC DATA (LABS, IMAGING, TESTING) - I reviewed patient records, labs, notes, testing and imaging myself where available.  Lab Results  Component Value Date   WBC 5.5 04/17/2022   HGB 14.1 04/17/2022   HCT 42.4 04/17/2022  MCV 89 04/17/2022   PLT 234 04/17/2022      Component Value Date/Time   NA 140 12/24/2022 1714   K 4.3 12/24/2022 1714   CL 102 12/24/2022 1714   CO2 23 12/24/2022 1714   GLUCOSE 75 12/24/2022 1714   BUN 23 12/24/2022 1714   CREATININE 0.84 12/24/2022 1714   CALCIUM 9.3 12/24/2022 1714   PROT 6.6 12/24/2022 1714   ALBUMIN 4.4 12/24/2022 1714   AST 21 12/24/2022 1714   ALT 13 12/24/2022 1714   ALKPHOS 68 12/24/2022 1714   BILITOT 0.6 12/24/2022 1714   GFRNONAA 90 04/16/2021 0000   GFRAA 109 04/16/2021 0000   Lab Results  Component Value Date   CHOL 199 12/24/2022   HDL 89 12/24/2022   LDLCALC 96 12/24/2022   TRIG 77 12/24/2022   CHOLHDL 2.2 12/24/2022   Lab Results  Component Value Date   HGBA1C 5.5 04/16/2021   Lab Results  Component Value Date   VITAMINB12 485 02/20/2023   Lab Results  Component Value Date   TSH 2.210 12/24/2022     PHYSICAL EXAM:  Today's Vitals   04/29/23 1453  BP: 123/72  Pulse: 68  Weight: 202 lb (91.6 kg)  Height: 5\' 4"  (1.626 m)   Body mass index is 34.67 kg/m.   Wt Readings from Last 3 Encounters:  04/29/23 202 lb (91.6 kg)  04/15/23 200 lb (90.7 kg)  04/01/23 197 lb (89.4 kg)     Ht Readings from Last 3 Encounters:  04/29/23 5\' 4"  (1.626 m)  04/15/23 5\' 4"  (1.626 m)  04/01/23 5\' 4"  (1.626 m)      General: The patient is awake, alert and appears not in acute distress. The patient is well groomed. Head: Normocephalic, atraumatic. Neck is supple.  Mallampati 3,  neck circumference:14.75 inches . Overbite- Nasal airflow  patent.  crossbite noticed.  Dental status: bruxism marks. Cardiovascular:  Regular rate and cardiac rhythm by pulse,  without distended neck veins. Respiratory: Lungs are clear to auscultation.  Skin:  Without evidence of ankle edema, or rash. Trunk: The patient's posture is erect.   NEUROLOGIC EXAM: The patient is awake and alert, oriented to place and time.   Memory subjective described as intact.  Attention span & concentration ability appears normal.  Speech is fluent,  without  dysarthria, dysphonia or aphasia.  Mood and affect are appropriate.   Cranial nerves: no loss of smell or taste reported  Pupils are equal and briskly reactive to light. Funduscopic exam deferred..  Extraocular movements in vertical and horizontal planes were intact and without nystagmus.  No Diplopia. Visual fields by finger perimetry are intact. Hearing was intact to soft voice and finger rubbing.    Facial motor strength is symmetric and tongue and uvula move midline.  Neck ROM : rotation, tilt and flexion extension were normal for age and shoulder shrug was symmetrical.    Motor exam:  Symmetric bulk, tone and ROM.   Normal tone without cog- wheeling, symmetric grip strength .   Sensory:  Fine touch, vibration were  normal.    Coordination: Rapid alternating  movements in the fingers/hands were of normal speed.    Symmetric DTRs.   ASSESSMENT AND PLAN :  62 y.o. year -old female here with:    1) OSA on new CPAP and with good use of PAP therapy.  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   2) she was never sleepy and she isn't any better - because  she didn'tt feel bad.   Snoring is improved or even controlled.    3) Obesity: Risk number one for her apnea condition is her weight: _ struggling with weight loss and [employer] insurance denied Qusemia. She is now covered by [employer] and  will not be able to see some of her established physicians.     Follow up either personally or through our NP within 12 months.   I would like to thank Dorothyann Peng, MD and  Corinna Capra, DO , at [facility].  for allowing me to meet with and to take care of this pleasant patient.   After spending a total time of  30  minutes face to face and additional time for physical and neurologic examination, review of laboratory studies,  personal review of imaging studies, reports and results of other testing and review of referral information / records as far as provided in visit.  Electronically signed by: Melvyn Novas, MD 04/29/2023 3:01 PM  [facility] Board certified by The [employer] of Sleep Medicine and Diplomate of the [employer] of Sleep Medicine. Board certified In Neurology through the ABPN, Fellow of the [employer] of Neurology.

## 2023-04-29 NOTE — Patient Instructions (Addendum)
1) OSA on new CPAP and with good use of PAP therapy.  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   2) she was never sleepy and she isn't any better - because  she didn'tt feel bad.   Snoring is improved or even controlled.    3) Obesity: Risk number one for her apnea condition is her weight: _ struggling with weight loss and [employer] insurance denied [employer]. She is now covered by [employer] and  will not be able to see some of her established physicians.     Follow up either personally or through our NP within 12 months.   I would like to thank Dorothyann Peng, MD and Dr. Manson Passey at [facility].  for allowing me to meet with and to take care of this pleasant patient.      Mindfulness-Based Stress Reduction Mindfulness-based stress reduction (MBSR) is a program that helps people learn to practice mindfulness. Mindfulness is the practice of consciously paying attention to the present moment. MBSR focuses on developing self-awareness, which lets you respond to life stress without judgment or negative feelings. It can be learned and practiced through techniques such as education, breathing exercises, meditation, and yoga. MBSR includes several mindfulness techniques in one program. MBSR works best when you understand the treatment, are willing to try new things, and can commit to spending time practicing what you learn. MBSR training may include learning about: How your feelings, thoughts, and reactions affect your body. New ways to respond to things that cause negative thoughts to start (triggers). How to notice your thoughts and let go of them. Practicing awareness of everyday things that you normally do without thinking. The techniques and goals of different types of meditation. What are the benefits of MBSR? MBSR can have many benefits, which include helping you to: Develop  self-awareness. This means knowing and understanding yourself. Learn skills and attitudes that help you to take part in your own health care. Learn new ways to care for yourself. Be more accepting about how things are, and let things go. Be less judgmental and approach things with an open mind. Be patient with yourself and trust yourself more. MBSR has also been shown to: Reduce negative emotions, such as sadness, overwhelm, and worry. Improve memory and focus. Change how you sense and react to pain. Boost your body's ability to fight infections. Help you connect better with other people. Improve your sense of well-being. How to practice mindfulness To do a basic awareness exercise: Find a comfortable place to sit. Pay attention to the present moment. Notice your thoughts, feelings, and surroundings just as they are. Avoid judging yourself, your feelings, or your surroundings. Make note of any judgment that comes up and let it go. Your mind may wander, and that is okay. Make note of when your thoughts drift, and return your attention to the present moment. To do basic mindfulness meditation: Find a comfortable place to sit. This may include a stable chair or a firm floor cushion. Sit upright with your back straight. Let your arms fall next to your sides, with your hands resting on your legs. If you are sitting in a chair, rest your feet flat on the floor. If you are sitting on a cushion, cross your legs in front of you. Keep your head in a neutral position with your chin dropped slightly. Relax your jaw  and rest the tip of your tongue on the roof of your mouth. Drop your gaze to the floor or close your eyes. Breathe normally and pay attention to your breath. Feel the air moving in and out of your nose. Feel your belly expanding and relaxing with each breath. Your mind may wander, and that is okay. Make note of when your thoughts drift, and return your attention to your breath. Avoid  judging yourself, your feelings, or your surroundings. Make note of any judgment or feelings that come up, let them go, and bring your attention back to your breath. When you are ready, lift your gaze or open your eyes. Pay attention to how your body feels after the meditation. Follow these instructions at home:  Find a local in-person or online MBSR program. Set aside some time regularly for mindfulness practice. Practice every day if you can. Even 10 minutes of practice is helpful. Find a mindfulness practice that works best for you. This may include one or more of the following: Meditation. This involves focusing your mind on a certain thought or activity. Breathing awareness exercises. These help you to stay present by focusing on your breath. Body scan. For this practice, you lie down and pay attention to each part of your body from head to toe. You can identify tension and soreness and consciously relax parts of your body. Yoga. Yoga involves stretching and breathing, and it can improve your ability to move and be flexible. It can also help you to test your body's limits, which can help you release stress. Mindful eating. This way of eating involves focusing on the taste, texture, color, and smell of each bite of food. This slows down eating and helps you feel full sooner. For this reason, it can be an important part of a weight loss plan. Find a podcast or recording that provides guidance for breathing awareness, body scan, or meditation exercises. You can listen to these any time when you have a free moment to rest without distractions. Follow your treatment plan as told by your health care provider. This may include taking regular medicines and making changes to your diet or lifestyle as recommended. Where to find more information You can find more information about MBSR from: Your health care provider. Community-based meditation centers or programs. Programs offered near  you. Summary Mindfulness-based stress reduction (MBSR) is a program that teaches you how to consciously pay attention to the present moment. It is used to help you deal better with daily stress, feelings, and pain. MBSR focuses on developing self-awareness, which allows you to respond to life stress without judgment or negative feelings. MBSR programs may involve learning different mindfulness practices, such as breathing exercises, meditation, yoga, body scan, or mindful eating. Find a mindfulness practice that works best for you, and set aside time for it on a regular basis. This information is not intended to replace advice given to you by your health care provider. Make sure you discuss any questions you have with your health care provider. Document Revised: 05/03/2021 Document Reviewed: 05/03/2021 Elsevier Patient Education  2024 [employer].

## 2023-05-06 ENCOUNTER — Ambulatory Visit (INDEPENDENT_AMBULATORY_CARE_PROVIDER_SITE_OTHER): Payer: Commercial Managed Care - PPO | Admitting: Nurse Practitioner

## 2023-05-06 ENCOUNTER — Encounter: Payer: Self-pay | Admitting: Nurse Practitioner

## 2023-05-06 ENCOUNTER — Encounter: Payer: 59 | Admitting: Internal Medicine

## 2023-05-06 VITALS — BP 103/65 | HR 66 | Temp 97.7°F | Ht 64.0 in | Wt 200.0 lb

## 2023-05-06 DIAGNOSIS — E669 Obesity, unspecified: Secondary | ICD-10-CM

## 2023-05-06 DIAGNOSIS — Z6834 Body mass index (BMI) 34.0-34.9, adult: Secondary | ICD-10-CM | POA: Diagnosis not present

## 2023-05-06 DIAGNOSIS — R632 Polyphagia: Secondary | ICD-10-CM

## 2023-05-06 MED ORDER — QSYMIA 7.5-46 MG PO CP24
ORAL_CAPSULE | ORAL | 0 refills | Status: DC
Start: 2023-05-06 — End: 2023-05-22

## 2023-05-06 NOTE — Progress Notes (Signed)
Office: [phone]  /  Fax: [phone]  WEIGHT SUMMARY AND BIOMETRICS  Weight Lost Since Last Visit: 0lb  Weight Gained Since Last Visit: 0lb   Vitals Temp: 97.7 F (36.5 C) BP: 103/65 Pulse Rate: 66 SpO2: 100 %   Anthropometric Measurements Height: 5\' 4"  (1.626 m) Weight: 200 lb (90.7 kg) BMI (Calculated): 34.31 Weight at Last Visit: 200lb Weight Lost Since Last Visit: 0lb Weight Gained Since Last Visit: 0lb Starting Weight: 196lb Total Weight Loss (lbs): 0 lb (0 kg)   Body Composition  Body Fat %: 46.3 % Fat Mass (lbs): 92.8 lbs Muscle Mass (lbs): 102.2 lbs Total Body Water (lbs): 77.4 lbs Visceral Fat Rating : 13   Other Clinical Data Fasting: No Labs: No Today's Visit #: 5 Starting Date: 02/20/23     HPI  Chief Complaint: OBESITY  Victoria Herman is here to discuss her progress with her obesity treatment plan. She is on the the Category 2 Plan and states she is following her eating plan approximately 50 % of the time. She states she is exercising 0 minutes 0 days per week.   Interval History:  Since last office visit she has maintained her weight.  She is struggling with motivation. Her husband is very supportive.  She does well with breakfast and lunch.  Struggles with snacking after dinner.  She is drinking water and some wine.    Highest weight was 212 lbs and her nadir weight was 180 lbs.    Pharmacotherapy for weight loss: She is currently taking Qsymia dose 2 for medical weight loss-started 1 week ago.  Denies side effects.    Previous pharmacotherapy for medical weight loss:  Qsymia  Bariatric surgery:  has not had bariatric surgery.    PHYSICAL EXAM:  Blood pressure 103/65, pulse 66, temperature 97.7 F (36.5 C), height 5\' 4"  (1.626 m), weight 200 lb (90.7 kg), last menstrual period 07/23/2013, SpO2 100%. Body mass index is 34.33 kg/m.  General: She is overweight, cooperative, alert, well developed, and in no acute distress. PSYCH:  Has normal mood, affect and thought process.   Extremities: No edema.  Neurologic: No gross sensory or motor deficits. No tremors or fasciculations noted.    DIAGNOSTIC DATA REVIEWED:  BMET    Component Value Date/Time   NA 140 12/24/2022 1714   K 4.3 12/24/2022 1714   CL 102 12/24/2022 1714   CO2 23 12/24/2022 1714   GLUCOSE 75 12/24/2022 1714   BUN 23 12/24/2022 1714   CREATININE 0.84 12/24/2022 1714   CALCIUM 9.3 12/24/2022 1714   GFRNONAA 90 04/16/2021 0000   GFRAA 109 04/16/2021 0000   Lab Results  Component Value Date   HGBA1C 5.5 04/16/2021   HGBA1C 5.6 03/29/2019   Lab Results  Component Value Date   INSULIN 11.3 02/20/2023   Lab Results  Component Value Date   TSH 2.210 12/24/2022   CBC    Component Value Date/Time   WBC 5.5 04/17/2022 1237   RBC 4.79 04/17/2022 1237   RBC 4.72 04/16/2021 0000   HGB 14.1 04/17/2022 1237   HCT 42.4 04/17/2022 1237   PLT 234 04/17/2022 1237   MCV 89 04/17/2022 1237   MCH 29.4 04/17/2022 1237   MCHC 33.3 04/17/2022 1237   RDW 12.9 04/17/2022 1237   Iron Studies    Component Value Date/Time   IRON 26 04/16/2021 0000   FERRITIN 86 04/16/2021 0000   Lipid Panel     Component Value Date/Time   CHOL 199  12/24/2022 1714   TRIG 77 12/24/2022 1714   HDL 89 12/24/2022 1714   CHOLHDL 2.2 12/24/2022 1714   LDLCALC 96 12/24/2022 1714   Hepatic Function Panel     Component Value Date/Time   PROT 6.6 12/24/2022 1714   ALBUMIN 4.4 12/24/2022 1714   AST 21 12/24/2022 1714   ALT 13 12/24/2022 1714   ALKPHOS 68 12/24/2022 1714   BILITOT 0.6 12/24/2022 1714      Component Value Date/Time   TSH 2.210 12/24/2022 1714   Nutritional Lab Results  Component Value Date   VD25OH 64.8 02/20/2023   VD25OH 99.5 12/24/2022   VD25OH 70.4 04/16/2021     ASSESSMENT AND PLAN  TREATMENT PLAN FOR OBESITY:  Recommended Dietary Goals  Danuta is currently in the action stage of change. As such, her goal is to continue weight  management plan. She has agreed to the Category 2 Plan-1200 calories and 75 grams of protein.    Behavioral Intervention  We discussed the following Behavioral Modification Strategies today: increasing lean protein intake, decreasing simple carbohydrates , increasing vegetables, increasing lower glycemic fruits, increasing water intake, work on meal planning and preparation, work on tracking and journaling calories using tracking application, continue to practice mindfulness when eating, and planning for success.  Additional resources provided today: NA  Recommended Physical Activity Goals  Voncile has been advised to work up to 150 minutes of moderate intensity aerobic activity a week and strengthening exercises 2-3 times per week for cardiovascular health, weight loss maintenance and preservation of muscle mass.   She has agreed to Think about ways to increase daily physical activity and overcoming barriers to exercise and Increase physical activity in their day and reduce sedentary time (increase NEAT).   Pharmacotherapy We discussed various medication options to help Tasneem with her weight loss efforts and we both agreed to continue Qsymia dose 2. Side effects discussed.  ASSOCIATED CONDITIONS ADDRESSED TODAY  Action/Plan  Polyphagia -     Qsymia; Will take 1 daily  Dispense: 30 capsule; Refill: 0  Generalized obesity -     Qsymia; Will take 1 daily  Dispense: 30 capsule; Refill: 0  BMI 34.0-34.9,adult -     Qsymia; Will take 1 daily  Dispense: 30 capsule; Refill: 0      She has a CPE with PCP in August.     Return in about 4 weeks (around 06/03/2023).Marland Kitchen She was informed of the importance of frequent follow up visits to maximize her success with intensive lifestyle modifications for her multiple health conditions.   ATTESTASTION STATEMENTS:  Reviewed by clinician on day of visit: allergies, medications, problem list, medical history, surgical history, family history, social  history, and previous encounter notes.      Theodis Sato. Gerrie Castiglia FNP-C

## 2023-05-16 DIAGNOSIS — G4733 Obstructive sleep apnea (adult) (pediatric): Secondary | ICD-10-CM | POA: Diagnosis not present

## 2023-05-20 ENCOUNTER — Encounter: Payer: 59 | Admitting: Internal Medicine

## 2023-05-21 ENCOUNTER — Ambulatory Visit (INDEPENDENT_AMBULATORY_CARE_PROVIDER_SITE_OTHER): Payer: Commercial Managed Care - PPO | Admitting: Internal Medicine

## 2023-05-21 ENCOUNTER — Other Ambulatory Visit (HOSPITAL_COMMUNITY): Payer: Self-pay

## 2023-05-21 VITALS — BP 108/74 | HR 62 | Temp 98.4°F | Ht 64.0 in | Wt 196.0 lb

## 2023-05-21 DIAGNOSIS — Z1211 Encounter for screening for malignant neoplasm of colon: Secondary | ICD-10-CM | POA: Diagnosis not present

## 2023-05-21 DIAGNOSIS — I7300 Raynaud's syndrome without gangrene: Secondary | ICD-10-CM | POA: Diagnosis not present

## 2023-05-21 DIAGNOSIS — E6609 Other obesity due to excess calories: Secondary | ICD-10-CM

## 2023-05-21 DIAGNOSIS — Z0000 Encounter for general adult medical examination without abnormal findings: Secondary | ICD-10-CM | POA: Diagnosis not present

## 2023-05-21 DIAGNOSIS — E7800 Pure hypercholesterolemia, unspecified: Secondary | ICD-10-CM

## 2023-05-21 DIAGNOSIS — Z6833 Body mass index (BMI) 33.0-33.9, adult: Secondary | ICD-10-CM

## 2023-05-21 DIAGNOSIS — Z114 Encounter for screening for human immunodeficiency virus [HIV]: Secondary | ICD-10-CM

## 2023-05-21 DIAGNOSIS — F419 Anxiety disorder, unspecified: Secondary | ICD-10-CM

## 2023-05-21 NOTE — Patient Instructions (Signed)

## 2023-05-21 NOTE — Progress Notes (Unsigned)
I,Victoria T Deloria Lair, CMA,acting as a Neurosurgeon for Gwynneth Aliment, MD.,have documented all relevant documentation on the behalf of Gwynneth Aliment, MD,as directed by  Gwynneth Aliment, MD while in the presence of Gwynneth Aliment, MD.  Subjective:    Patient ID: Victoria Herman , female    DOB: June 29, 1961 , 62 y.o.   MRN: [id number]  Chief Complaint  Patient presents with  . Annual Exam    HPI  The patient is here for a physical examination.  Last pap was June 2020. She has no specific concerns or complaints at this time.     Past Medical History:  Diagnosis Date  . Allergy   . Anxiety   . Constipation   . GERD (gastroesophageal reflux disease)   . Hyperlipidemia   . Osteoarthritis   . Seasonal allergies   . Sleep apnea   . Superficial thrombophlebitis 03-2014, 07-2014  . Swallowing difficulty   . Vitamin D deficiency      Family History  Problem Relation Age of Onset  . Hypertension Mother   . Diabetes Mother   . Varicose Veins Mother   . Hyperlipidemia Mother   . Cancer Mother   . Varicose Veins Father   . Cancer Father   . Hypertension Sister   . Diabetes Sister   . Thyroid disease Sister   . Varicose Veins Brother   . Hypertension Brother   . Hypertension Brother   . Varicose Veins Brother   . Healthy Daughter   . Migraines Daughter   . Colon cancer Neg Hx   . Esophageal cancer Neg Hx   . Rectal cancer Neg Hx   . Stomach cancer Neg Hx   . Breast cancer Neg Hx      Current Outpatient Medications:  .  amLODipine (NORVASC) 5 MG tablet, Take 1 tablet (5 mg total) by mouth daily., Disp: 30 tablet, Rfl: 2 .  atorvastatin (LIPITOR) 20 MG tablet, Take 1 tablet (20 mg total) by mouth daily Monday through Saturday, skip Sunday, Disp: 90 tablet, Rfl: 2 .  Barberry-Oreg Grape-Goldenseal (BERBERINE COMPLEX PO), Take 2 tablets by mouth daily., Disp: , Rfl:  .  Calcium Carb-Cholecalciferol (CALCIUM 600 + D PO), Take 1 tablet by mouth daily. Vit D 12.5, Disp: , Rfl:  .   cetirizine (ZYRTEC) 10 MG tablet, Take 10 mg by mouth daily., Disp: , Rfl:  .  Cholecalciferol (VITAMIN D3 SUPER STRENGTH) 50 MCG (2000 UT) CAPS, Take 2,000 Units by mouth daily., Disp: , Rfl:  .  citalopram (CELEXA) 20 MG tablet, Take 1 tablet (20 mg total) by mouth daily., Disp: 90 tablet, Rfl: 2 .  EPINEPHrine (EPIPEN 2-PAK) 0.3 mg/0.3 mL IJ SOAJ injection, Inject 0.3 mg into the muscle as needed for systemic reaction, Disp: 2 each, Rfl: 1 .  hydrocortisone 2.5 % cream, Apply topically 2 (two) times daily. (Patient taking differently: Apply topically as needed.), Disp: 30 g, Rfl: 0 .  mometasone (NASONEX) 50 MCG/ACT nasal spray, Use 2 sprays in each nostril once daily, Disp: 17 g, Rfl: 5 .  Phentermine-Topiramate (QSYMIA) 7.5-46 MG CP24, Will take 1 daily, Disp: 30 capsule, Rfl: 0   Allergies  Allergen Reactions  . E-Mycin [Erythromycin] Diarrhea  . Sulfa Antibiotics Rash      The patient states she uses post menopausal status for birth control. Patient's last menstrual period was 07/23/2013.. Negative for Dysmenorrhea. Negative for: breast discharge, breast lump(s), breast pain and breast self exam. Associated symptoms include abnormal  vaginal bleeding. Pertinent negatives include abnormal bleeding (hematology), anxiety, decreased libido, depression, difficulty falling sleep, dyspareunia, history of infertility, nocturia, sexual dysfunction, sleep disturbances, urinary incontinence, urinary urgency, vaginal discharge and vaginal itching. Diet regular.The patient states her exercise level is    . The patient's tobacco use is:  Social History   Tobacco Use  Smoking Status Never  . Passive exposure: Past  Smokeless Tobacco Never  . She has been exposed to passive smoke. The patient's alcohol use is:  Social History   Substance and Sexual Activity  Alcohol Use Yes   Comment: occas.   Review of Systems  Constitutional: Negative.   HENT: Negative.    Eyes: Negative.   Respiratory:  Negative.    Cardiovascular: Negative.   Gastrointestinal: Negative.   Endocrine: Negative.   Genitourinary: Negative.   Musculoskeletal: Negative.   Skin: Negative.   Allergic/Immunologic: Negative.   Neurological: Negative.   Hematological: Negative.   Psychiatric/Behavioral: Negative.       Today's Vitals   05/21/23 1127  BP: 108/74  Pulse: 62  Temp: 98.4 F (36.9 C)  Weight: 196 lb (88.9 kg)  Height: 5\' 4"  (1.626 m)  PainSc: 0-No pain   Body mass index is 33.64 kg/m.  Wt Readings from Last 3 Encounters:  05/21/23 196 lb (88.9 kg)  05/06/23 200 lb (90.7 kg)  04/29/23 202 lb (91.6 kg)     Objective:  Physical Exam Vitals and nursing note reviewed. Exam conducted with a chaperone present.  Constitutional:      Appearance: Normal appearance.  HENT:     Head: Normocephalic and atraumatic.     Right Ear: Tympanic membrane, ear canal and external ear normal.     Left Ear: Tympanic membrane, ear canal and external ear normal.     Nose: Nose normal.     Mouth/Throat:     Mouth: Mucous membranes are moist.     Pharynx: Oropharynx is clear.  Eyes:     Extraocular Movements: Extraocular movements intact.     Conjunctiva/sclera: Conjunctivae normal.     Pupils: Pupils are equal, round, and reactive to light.  Cardiovascular:     Rate and Rhythm: Normal rate and regular rhythm.     Pulses: Normal pulses.     Heart sounds: Normal heart sounds.     Comments: Varicose veins b/l LE Pulmonary:     Effort: Pulmonary effort is normal.     Breath sounds: Normal breath sounds.  Abdominal:     General: Abdomen is flat. Bowel sounds are normal.     Palpations: Abdomen is soft.  Genitourinary:    General: Normal vulva.     Exam position: Lithotomy position.     Tanner stage (genital): 5.     Vagina: Normal.     Cervix: Normal.     Uterus: Normal.      Rectum: Normal. Guaiac result negative.     Comments: deferred Musculoskeletal:        General: Normal range of motion.      Cervical back: Normal range of motion and neck supple.  Lymphadenopathy:     Lower Body: No right inguinal adenopathy. No left inguinal adenopathy.  Skin:    General: Skin is warm and dry.  Neurological:     General: No focal deficit present.     Mental Status: She is alert and oriented to person, place, and time.  Psychiatric:        Mood and Affect: Mood normal.  Behavior: Behavior normal.        Assessment And Plan:     Routine general medical examination at health care facility  Pure hypercholesterolemia  Anxiety  Vitamin D deficiency disease  Screening for HIV (human immunodeficiency virus)  Class 1 obesity due to excess calories with body mass index (BMI) of 33.0 to 33.9 in adult, unspecified whether serious comorbidity present     Return for 1 year physical. Patient was given opportunity to ask questions. Patient verbalized understanding of the plan and was able to repeat key elements of the plan. All questions were answered to their satisfaction.   I, Gwynneth Aliment, MD, have reviewed all documentation for this visit. The documentation on 05/21/23 for the exam, diagnosis, procedures, and orders are all accurate and complete.

## 2023-05-22 ENCOUNTER — Encounter: Payer: Self-pay | Admitting: Bariatrics

## 2023-05-22 ENCOUNTER — Other Ambulatory Visit: Payer: Self-pay

## 2023-05-22 ENCOUNTER — Encounter: Payer: Self-pay | Admitting: Internal Medicine

## 2023-05-22 ENCOUNTER — Other Ambulatory Visit (INDEPENDENT_AMBULATORY_CARE_PROVIDER_SITE_OTHER): Payer: Self-pay | Admitting: Bariatrics

## 2023-05-22 ENCOUNTER — Other Ambulatory Visit (HOSPITAL_COMMUNITY): Payer: Self-pay

## 2023-05-22 DIAGNOSIS — R632 Polyphagia: Secondary | ICD-10-CM

## 2023-05-22 DIAGNOSIS — E669 Obesity, unspecified: Secondary | ICD-10-CM

## 2023-05-22 DIAGNOSIS — Z6834 Body mass index (BMI) 34.0-34.9, adult: Secondary | ICD-10-CM

## 2023-05-22 LAB — CBC
Hematocrit: 41.6 % (ref 34.0–46.6)
Hemoglobin: 14 g/dL (ref 11.1–15.9)
MCH: 29.8 pg (ref 26.6–33.0)
MCHC: 33.7 g/dL (ref 31.5–35.7)
MCV: 89 fL (ref 79–97)
Platelets: 256 10*3/uL (ref 150–450)
RBC: 4.7 x10E6/uL (ref 3.77–5.28)
RDW: 12.6 % (ref 11.7–15.4)
WBC: 6.1 10*3/uL (ref 3.4–10.8)

## 2023-05-22 LAB — CMP14+EGFR
ALT: 13 IU/L (ref 0–32)
AST: 17 IU/L (ref 0–40)
Albumin: 4.5 g/dL (ref 3.8–4.9)
Alkaline Phosphatase: 80 IU/L (ref 44–121)
BUN/Creatinine Ratio: 19 (ref 12–28)
BUN: 13 mg/dL (ref 8–27)
Bilirubin Total: 0.5 mg/dL (ref 0.0–1.2)
CO2: 23 mmol/L (ref 20–29)
Calcium: 9.6 mg/dL (ref 8.7–10.3)
Chloride: 103 mmol/L (ref 96–106)
Creatinine, Ser: 0.67 mg/dL (ref 0.57–1.00)
Globulin, Total: 2.5 g/dL (ref 1.5–4.5)
Glucose: 79 mg/dL (ref 70–99)
Potassium: 4.2 mmol/L (ref 3.5–5.2)
Sodium: 141 mmol/L (ref 134–144)
Total Protein: 7 g/dL (ref 6.0–8.5)
eGFR: 100 mL/min/{1.73_m2} (ref >59)

## 2023-05-22 LAB — LIPID PANEL
Chol/HDL Ratio: 2.1 ratio (ref 0.0–4.4)
Cholesterol, Total: 160 mg/dL (ref 100–199)
HDL: 77 mg/dL (ref >39)
LDL Chol Calc (NIH): 69 mg/dL (ref 0–99)
Triglycerides: 76 mg/dL (ref 0–149)
VLDL Cholesterol Cal: 14 mg/dL (ref 5–40)

## 2023-05-22 LAB — HIV ANTIBODY (ROUTINE TESTING W REFLEX): HIV Screen 4th Generation wRfx: NONREACTIVE

## 2023-05-22 MED ORDER — QSYMIA 7.5-46 MG PO CP24
ORAL_CAPSULE | ORAL | 0 refills | Status: DC
Start: 2023-05-22 — End: 2023-05-22

## 2023-05-22 MED ORDER — QSYMIA 7.5-46 MG PO CP24
1.0000 | ORAL_CAPSULE | Freq: Every day | ORAL | 0 refills | Status: DC
  Filled 2023-05-22: qty 30, 30d supply, fill #0

## 2023-05-22 NOTE — Assessment & Plan Note (Signed)
She will continue to be followed by [facility], their input is appreciated. She is encouraged to aim for at least 150 minutes of exercise/week.

## 2023-05-22 NOTE — Assessment & Plan Note (Signed)

## 2023-05-22 NOTE — Assessment & Plan Note (Signed)
Chronic, sx are currently stable. She will continue with use of amlodipine 5mg  daily prn, mostly used during winter months.

## 2023-05-23 ENCOUNTER — Other Ambulatory Visit (HOSPITAL_COMMUNITY): Payer: Self-pay

## 2023-05-27 ENCOUNTER — Other Ambulatory Visit (HOSPITAL_COMMUNITY): Payer: Self-pay

## 2023-05-27 ENCOUNTER — Encounter (HOSPITAL_COMMUNITY): Payer: Self-pay

## 2023-05-27 ENCOUNTER — Other Ambulatory Visit: Payer: Self-pay

## 2023-06-01 ENCOUNTER — Other Ambulatory Visit (HOSPITAL_COMMUNITY): Payer: Self-pay

## 2023-06-03 ENCOUNTER — Other Ambulatory Visit (HOSPITAL_COMMUNITY): Payer: Self-pay

## 2023-06-03 ENCOUNTER — Ambulatory Visit: Payer: Commercial Managed Care - PPO | Admitting: Nurse Practitioner

## 2023-06-03 ENCOUNTER — Encounter: Payer: Self-pay | Admitting: Nurse Practitioner

## 2023-06-03 VITALS — BP 115/74 | HR 73 | Temp 97.9°F | Ht 64.0 in | Wt 194.0 lb

## 2023-06-03 DIAGNOSIS — Z6833 Body mass index (BMI) 33.0-33.9, adult: Secondary | ICD-10-CM

## 2023-06-03 DIAGNOSIS — E7849 Other hyperlipidemia: Secondary | ICD-10-CM | POA: Diagnosis not present

## 2023-06-03 DIAGNOSIS — E669 Obesity, unspecified: Secondary | ICD-10-CM

## 2023-06-03 DIAGNOSIS — R632 Polyphagia: Secondary | ICD-10-CM

## 2023-06-03 NOTE — Progress Notes (Signed)
Office: [phone]  /  Fax: [phone]  WEIGHT SUMMARY AND BIOMETRICS  Weight Lost Since Last Visit: 6lb  Weight Gained Since Last Visit: 0lb   Vitals Temp: 97.9 F (36.6 C) BP: 115/74 Pulse Rate: 73 SpO2: 97 %   Anthropometric Measurements Height: 5\' 4"  (1.626 m) Weight: 194 lb (88 kg) BMI (Calculated): 33.28 Weight at Last Visit: 200lb Weight Lost Since Last Visit: 6lb Weight Gained Since Last Visit: 0lb Starting Weight: 196lb Total Weight Loss (lbs): 2 lb (0.907 kg)   Body Composition  Body Fat %: 45 % Fat Mass (lbs): 87.4 lbs Muscle Mass (lbs): 101.4 lbs Total Body Water (lbs): 75.8 lbs Visceral Fat Rating : 12   Other Clinical Data Fasting: No Labs: No Today's Visit #: 6 Starting Date: 02/20/23     HPI  Chief Complaint: OBESITY  Victoria Herman is here to discuss her progress with her obesity treatment plan. She is on the the Category 2 Plan and states she is following her eating plan approximately 70 % of the time. She states she is exercising 30 minutes 2 days per week-walking.     Interval History:  Since last office visit she has lost 6 pounds. She is not skipping meals.  She is aiming to eat more protein.  She has decreased her snacking.  Feels that the Qsymia has helped with snacking.  She has decreased her wine intake. She is drinking water and 1 soda daily.  She notes her clothes are fitting better.  She is going to the beach this week.    Highest weight was 212 lbs and her nadir weight was 180 lbs.    Pharmacotherapy for weight loss: She is currently taking Qsymia dose 2 for medical weight loss.  Denies side effects.  Has helped with polyphagia and has decreased cravings.    Previous pharmacotherapy for medical weight loss:  Qsymia  Bariatric surgery:  Patient has not had bariatric surgery.    Hyperlipidemia Medication(s): lipitor 20mg . Denies side effects.   Had CT cardiac scoring on 01/16/23  Lab Results  Component Value Date   CHOL  160 05/21/2023   HDL 77 05/21/2023   LDLCALC 69 05/21/2023   TRIG 76 05/21/2023   CHOLHDL 2.1 05/21/2023   Lab Results  Component Value Date   ALT 13 05/21/2023   AST 17 05/21/2023   ALKPHOS 80 05/21/2023   BILITOT 0.5 05/21/2023   The 10-year ASCVD risk score (Arnett DK, et al., 2019) is: 1.8%   Values used to calculate the score:     Age: 62 years     Sex: Female     Is Non-Hispanic African American: No     Diabetic: No     Tobacco smoker: No     Systolic Blood Pressure: 115 mmHg     Is BP treated: No     HDL Cholesterol: 77 mg/dL     Total Cholesterol: 160 mg/dL   PHYSICAL EXAM:  Blood pressure 115/74, pulse 73, temperature 97.9 F (36.6 C), height 5\' 4"  (1.626 m), weight 194 lb (88 kg), last menstrual period 07/23/2013, SpO2 97%. Body mass index is 33.3 kg/m.  General: She is overweight, cooperative, alert, well developed, and in no acute distress. PSYCH: Has normal mood, affect and thought process.   Extremities: No edema.  Neurologic: No gross sensory or motor deficits. No tremors or fasciculations noted.    DIAGNOSTIC DATA REVIEWED:  BMET    Component Value Date/Time   NA 141 05/21/2023 1224  K 4.2 05/21/2023 1224   CL 103 05/21/2023 1224   CO2 23 05/21/2023 1224   GLUCOSE 79 05/21/2023 1224   BUN 13 05/21/2023 1224   CREATININE 0.67 05/21/2023 1224   CALCIUM 9.6 05/21/2023 1224   GFRNONAA 90 04/16/2021 0000   GFRAA 109 04/16/2021 0000   Lab Results  Component Value Date   HGBA1C 5.5 04/16/2021   HGBA1C 5.6 03/29/2019   Lab Results  Component Value Date   INSULIN 11.3 02/20/2023   Lab Results  Component Value Date   TSH 2.210 12/24/2022   CBC    Component Value Date/Time   WBC 6.1 05/21/2023 1224   RBC 4.70 05/21/2023 1224   RBC 4.72 04/16/2021 0000   HGB 14.0 05/21/2023 1224   HCT 41.6 05/21/2023 1224   PLT 256 05/21/2023 1224   MCV 89 05/21/2023 1224   MCH 29.8 05/21/2023 1224   MCHC 33.7 05/21/2023 1224   RDW 12.6 05/21/2023  1224   Iron Studies    Component Value Date/Time   IRON 26 04/16/2021 0000   FERRITIN 86 04/16/2021 0000   Lipid Panel     Component Value Date/Time   CHOL 160 05/21/2023 1224   TRIG 76 05/21/2023 1224   HDL 77 05/21/2023 1224   CHOLHDL 2.1 05/21/2023 1224   LDLCALC 69 05/21/2023 1224   Hepatic Function Panel     Component Value Date/Time   PROT 7.0 05/21/2023 1224   ALBUMIN 4.5 05/21/2023 1224   AST 17 05/21/2023 1224   ALT 13 05/21/2023 1224   ALKPHOS 80 05/21/2023 1224   BILITOT 0.5 05/21/2023 1224      Component Value Date/Time   TSH 2.210 12/24/2022 1714   Nutritional Lab Results  Component Value Date   VD25OH 64.8 02/20/2023   VD25OH 99.5 12/24/2022   VD25OH 70.4 04/16/2021     ASSESSMENT AND PLAN  TREATMENT PLAN FOR OBESITY:  Recommended Dietary Goals  Victoria Herman is currently in the action stage of change. As such, her goal is to continue weight management plan. She has agreed to the Category 2 Plan.  Behavioral Intervention  We discussed the following Behavioral Modification Strategies today: increasing lean protein intake, decreasing simple carbohydrates , increasing vegetables, increasing lower glycemic fruits, increasing water intake, reading food labels , keeping healthy foods at home, continue to practice mindfulness when eating, and planning for success.  Additional resources provided today: NA  Recommended Physical Activity Goals  Victoria Herman has been advised to work up to 150 minutes of moderate intensity aerobic activity a week and strengthening exercises 2-3 times per week for cardiovascular health, weight loss maintenance and preservation of muscle mass.   She has agreed to Continue current level of physical activity  and Think about ways to increase daily physical activity and overcoming barriers to exercise   Pharmacotherapy We discussed various medication options to help Victoria Herman with her weight loss efforts and we both agreed to continue Qsymia  dose 2.  Side effects discussed.  ASSOCIATED CONDITIONS ADDRESSED TODAY  Action/Plan  Polyphagia Doing well with Qsymia dose 1.  Side effects discussed  Other hyperlipidemia Continue meds as directed  Generalized obesity  BMI 33.0-33.9,adult      Return in about 4 weeks (around 07/01/2023).Marland Kitchen She was informed of the importance of frequent follow up visits to maximize her success with intensive lifestyle modifications for her multiple health conditions.   ATTESTASTION STATEMENTS:  Reviewed by clinician on day of visit: allergies, medications, problem list, medical history, surgical history, family  history, social history, and previous encounter notes.   Time spent on visit including pre-visit chart review and post-visit care and charting was 30 minutes.    Theodis Sato. Marlicia Sroka FNP-C

## 2023-06-04 ENCOUNTER — Other Ambulatory Visit: Payer: Self-pay

## 2023-06-05 DIAGNOSIS — G4733 Obstructive sleep apnea (adult) (pediatric): Secondary | ICD-10-CM | POA: Diagnosis not present

## 2023-06-16 DIAGNOSIS — G4733 Obstructive sleep apnea (adult) (pediatric): Secondary | ICD-10-CM | POA: Diagnosis not present

## 2023-06-19 ENCOUNTER — Ambulatory Visit
Admission: RE | Admit: 2023-06-19 | Discharge: 2023-06-19 | Disposition: A | Discharge status: Home / Self Care | Payer: Commercial Managed Care - PPO | Source: Ambulatory Visit | Attending: Internal Medicine | Admitting: Internal Medicine

## 2023-06-19 DIAGNOSIS — Z1231 Encounter for screening mammogram for malignant neoplasm of breast: Secondary | ICD-10-CM

## 2023-06-23 ENCOUNTER — Encounter: Payer: Self-pay | Admitting: Nurse Practitioner

## 2023-06-24 ENCOUNTER — Encounter: Payer: Self-pay | Admitting: Internal Medicine

## 2023-07-06 DIAGNOSIS — G4733 Obstructive sleep apnea (adult) (pediatric): Secondary | ICD-10-CM | POA: Diagnosis not present

## 2023-07-07 ENCOUNTER — Other Ambulatory Visit (HOSPITAL_COMMUNITY): Payer: Self-pay

## 2023-07-08 ENCOUNTER — Other Ambulatory Visit (HOSPITAL_COMMUNITY): Payer: Self-pay

## 2023-07-08 ENCOUNTER — Ambulatory Visit: Payer: Commercial Managed Care - PPO | Admitting: Nurse Practitioner

## 2023-07-10 ENCOUNTER — Other Ambulatory Visit (HOSPITAL_COMMUNITY): Payer: Self-pay

## 2023-07-16 DIAGNOSIS — G4733 Obstructive sleep apnea (adult) (pediatric): Secondary | ICD-10-CM | POA: Diagnosis not present

## 2023-08-05 DIAGNOSIS — G4733 Obstructive sleep apnea (adult) (pediatric): Secondary | ICD-10-CM | POA: Diagnosis not present

## 2023-08-07 ENCOUNTER — Other Ambulatory Visit (HOSPITAL_COMMUNITY): Payer: Self-pay

## 2023-08-07 ENCOUNTER — Ambulatory Visit: Payer: Commercial Managed Care - PPO

## 2023-08-07 VITALS — Ht 64.0 in | Wt 199.0 lb

## 2023-08-07 DIAGNOSIS — Z1211 Encounter for screening for malignant neoplasm of colon: Secondary | ICD-10-CM

## 2023-08-07 MED ORDER — NA SULFATE-K SULFATE-MG SULF 17.5-3.13-1.6 GM/177ML PO SOLN
1.0000 | Freq: Once | ORAL | 0 refills | Status: AC
  Filled 2023-08-07: qty 354, 2d supply, fill #0

## 2023-08-07 NOTE — Progress Notes (Signed)

## 2023-08-14 ENCOUNTER — Encounter: Payer: Self-pay | Admitting: Internal Medicine

## 2023-08-14 ENCOUNTER — Other Ambulatory Visit (HOSPITAL_COMMUNITY): Payer: Self-pay

## 2023-08-16 DIAGNOSIS — G4733 Obstructive sleep apnea (adult) (pediatric): Secondary | ICD-10-CM | POA: Diagnosis not present

## 2023-08-21 NOTE — Progress Notes (Signed)
[facility] History and Physical   Primary Care Physician:  Dorothyann Peng, MD   Reason for Procedure:  Colon cancer screening  Plan:    Colonoscopy     HPI: Victoria Herman is a 62 y.o. female presenting for a repeat screening exam of the colon.  2014 colonoscopy was normal.   Past Medical History:  Diagnosis Date   Allergy    Anxiety    Constipation    GERD (gastroesophageal reflux disease)    Hyperlipidemia    Osteoarthritis    Seasonal allergies    Sleep apnea    Superficial thrombophlebitis 03-2014, 07-2014   Swallowing difficulty    Vitamin D deficiency     Past Surgical History:  Procedure Laterality Date   BROW LIFT  03/11/2023   COLONOSCOPY     ORIF WRIST FRACTURE Left 04/12/2020   Procedure: OPEN REDUCTION INTERNAL FIXATION (ORIF) WRIST FRACTURE and repair reconstruction as indicated;  Surgeon: Bradly Bienenstock, MD;  Location: [facility] OR;  Service: Orthopedics;  Laterality: Left;  with IV sedation  Needs 2 hours   WISDOM TOOTH EXTRACTION      Prior to Admission medications   Medication Sig Start Date End Date Taking? Authorizing Provider  amLODipine (NORVASC) 5 MG tablet Take 1 tablet (5 mg total) by mouth daily. Patient not taking: Reported on 08/07/2023 10/30/21   Pollyann Savoy, MD  atorvastatin (LIPITOR) 20 MG tablet Take 1 tablet (20 mg total) by mouth daily Monday through Saturday, skip Sunday 08/20/22   Dorothyann Peng, MD  Barberry-Oreg Grape-Goldenseal (BERBERINE COMPLEX PO) Take 2 tablets by mouth daily.    [provider]  Calcium Carb-Cholecalciferol (CALCIUM 600 + D PO) Take 1 tablet by mouth daily. Vit D 12.5    [provider]  cetirizine (ZYRTEC) 10 MG tablet Take 10 mg by mouth daily.    [provider]  Cholecalciferol (VITAMIN D3 SUPER STRENGTH) 50 MCG (2000 UT) CAPS Take 2,000 Units by mouth daily.    [provider]  citalopram (CELEXA) 20 MG tablet Take 1 tablet (20 mg total) by mouth daily.  11/09/22 11/09/23  Dorothyann Peng, MD  EPINEPHrine (EPIPEN 2-PAK) 0.3 mg/0.3 mL IJ SOAJ injection Inject 0.3 mg into the muscle as needed for systemic reaction 04/02/23     hydrocortisone 2.5 % cream Apply topically 2 (two) times daily. Patient taking differently: Apply topically as needed. 02/05/22   Waldon Merl, PA-C  mometasone (NASONEX) 50 MCG/ACT nasal spray Use 2 sprays in each nostril once daily Patient taking differently: Place into the nose daily as needed. 03/28/21   Scharlene Gloss, MD    Current Outpatient Medications  Medication Sig Dispense Refill   atorvastatin (LIPITOR) 20 MG tablet Take 1 tablet (20 mg total) by mouth daily Monday through Saturday, skip Sunday 90 tablet 2   Barberry-Oreg Grape-Goldenseal (BERBERINE COMPLEX PO) Take 2 tablets by mouth daily.     Calcium Carb-Cholecalciferol (CALCIUM 600 + D PO) Take 1 tablet by mouth daily. Vit D 12.5     cetirizine (ZYRTEC) 10 MG tablet Take 10 mg by mouth daily.     Cholecalciferol (VITAMIN D3 SUPER STRENGTH) 50 MCG (2000 UT) CAPS Take 2,000 Units by mouth daily.     citalopram (CELEXA) 20 MG tablet Take 1 tablet (20 mg total) by mouth daily. 90 tablet 2   amLODipine (NORVASC) 5 MG tablet Take 1 tablet (5 mg total) by mouth daily. (Patient not taking: Reported on 08/07/2023) 30 tablet 2  EPINEPHrine (EPIPEN 2-PAK) 0.3 mg/0.3 mL IJ SOAJ injection Inject 0.3 mg into the muscle as needed for systemic reaction 2 each 1   hydrocortisone 2.5 % cream Apply topically 2 (two) times daily. (Patient taking differently: Apply topically as needed.) 30 g 0   mometasone (NASONEX) 50 MCG/ACT nasal spray Use 2 sprays in each nostril once daily (Patient taking differently: Place into the nose daily as needed.) 17 g 5   Current Facility-Administered Medications  Medication Dose Route Frequency Provider Last Rate Last Admin   0.9 %  sodium chloride infusion  500 mL Intravenous Once Iva Boop, MD        Allergies as of 08/22/2023 -  Review Complete 08/22/2023  Allergen Reaction Noted   E-mycin [erythromycin] Diarrhea 10/26/2017   Sulfa antibiotics Rash 12/22/2011    Family History  Problem Relation Age of Onset   Hypertension Mother    Diabetes Mother    Varicose Veins Mother    Hyperlipidemia Mother    Cancer Mother    Varicose Veins Father    Cancer Father    Hypertension Sister    Diabetes Sister    Thyroid disease Sister    Varicose Veins Brother    Hypertension Brother    Hypertension Brother    Varicose Veins Brother    Healthy Daughter    Migraines Daughter    Colon cancer Neg Hx    Esophageal cancer Neg Hx    Rectal cancer Neg Hx    Stomach cancer Neg Hx    Breast cancer Neg Hx     Social History   Socioeconomic History   Marital status: Married    Spouse name: Not on file   Number of children: Not on file   Years of education: Not on file   Highest education level: Bachelor's degree (e.g., BA, AB, BS)  Occupational History   Not on file  Tobacco Use   Smoking status: Never    Passive exposure: Past   Smokeless tobacco: Never  Vaping Use   Vaping status: Never Used  Substance and Sexual Activity   Alcohol use: Yes    Comment: occas.   Drug use: Never   Sexual activity: Not on file  Other Topics Concern   Not on file  Social History Narrative   Not on file   Social Determinants of Health   Financial Resource Strain: Low Risk  (05/21/2023)   Overall Financial Resource Strain (CARDIA)    Difficulty of Paying Living Expenses: Not hard at all  Food Insecurity: No Food Insecurity (05/21/2023)   Hunger Vital Sign    Worried About Running Out of Food in the Last Year: Never true    Ran Out of Food in the Last Year: Never true  Transportation Needs: No Transportation Needs (05/21/2023)   PRAPARE - Administrator, Civil Service (Medical): No    Lack of Transportation (Non-Medical): No  Physical Activity: Insufficiently Active (05/21/2023)   Exercise Vital Sign    Days  of Exercise per Week: 2 days    Minutes of Exercise per Session: 30 min  Stress: No Stress Concern Present (05/21/2023)   [employer] of Occupational Health - Occupational Stress Questionnaire    Feeling of Stress : Only a little  Social Connections: Moderately Integrated (05/21/2023)   Social Connection and Isolation Panel [NHANES]    Frequency of Communication with Friends and Family: More than three times a week    Frequency of Social Gatherings with Friends  and Family: Twice a week    Attends Religious Services: More than 4 times per year    Active Member of Clubs or Organizations: No    Attends Engineer, structural: Not on file    Marital Status: Married  Catering manager Violence: Not on file    Review of Systems:  All other review of systems negative except as mentioned in the HPI.  Physical Exam: Vital signs BP (!) 148/76   Pulse 72   Temp (!) 97.3 F (36.3 C) (Temporal)   Ht 5\' 4"  (1.626 m)   Wt 199 lb (90.3 kg)   LMP 07/23/2013   SpO2 98%   BMI 34.16 kg/m   General:   Alert,  Well-developed, well-nourished, pleasant and cooperative in NAD Lungs:  Clear throughout to auscultation.   Heart:  Regular rate and rhythm; no murmurs, clicks, rubs,  or gallops. Abdomen:  Soft, nontender and nondistended. Normal bowel sounds.   Neuro/Psych:  Alert and cooperative. Normal mood and affect. A and O x 3   @Essence Merle  Sena Slate, MD, [facility] [phone] (pager) 08/22/2023 10:50 AM@

## 2023-08-22 ENCOUNTER — Encounter: Payer: Self-pay | Admitting: Internal Medicine

## 2023-08-22 ENCOUNTER — Ambulatory Visit: Payer: Commercial Managed Care - PPO | Admitting: Internal Medicine

## 2023-08-22 VITALS — BP 125/76 | HR 61 | Temp 97.3°F | Resp 16 | Ht 64.0 in | Wt 199.0 lb

## 2023-08-22 DIAGNOSIS — E785 Hyperlipidemia, unspecified: Secondary | ICD-10-CM | POA: Diagnosis not present

## 2023-08-22 DIAGNOSIS — K635 Polyp of colon: Secondary | ICD-10-CM | POA: Diagnosis not present

## 2023-08-22 DIAGNOSIS — Z1211 Encounter for screening for malignant neoplasm of colon: Secondary | ICD-10-CM | POA: Diagnosis not present

## 2023-08-22 DIAGNOSIS — G4730 Sleep apnea, unspecified: Secondary | ICD-10-CM | POA: Diagnosis not present

## 2023-08-22 DIAGNOSIS — D123 Benign neoplasm of transverse colon: Secondary | ICD-10-CM

## 2023-08-22 DIAGNOSIS — D120 Benign neoplasm of cecum: Secondary | ICD-10-CM

## 2023-08-22 DIAGNOSIS — F419 Anxiety disorder, unspecified: Secondary | ICD-10-CM | POA: Diagnosis not present

## 2023-08-22 MED ORDER — SODIUM CHLORIDE 0.9 % IV SOLN: 500.0000 mL | Freq: Once | INTRAVENOUS | Status: DC

## 2023-08-22 NOTE — Progress Notes (Signed)
Vss nad trans to pacu 

## 2023-08-22 NOTE — Op Note (Signed)
[facility] Patient Name: Victoria Herman Procedure Date: 08/22/2023 10:53 AM MRN: [id number] Endoscopist: Iva Boop , MD, [id number] Age: 62 Referring MD:  Date of Birth: Jul 30, 1961 Gender: Female Account #: [id number] Procedure:                Colonoscopy Indications:              Screening for colorectal malignant neoplasm, Last                            colonoscopy: 2014 Medicines:                Monitored Anesthesia Care Procedure:                Pre-Anesthesia Assessment:                           - Prior to the procedure, a History and Physical                            was performed, and patient medications and                            allergies were reviewed. The patient's tolerance of                            previous anesthesia was also reviewed. The risks                            and benefits of the procedure and the sedation                            options and risks were discussed with the patient.                            All questions were answered, and informed consent                            was obtained. Prior Anticoagulants: The patient has                            taken no anticoagulant or antiplatelet agents. ASA                            Grade Assessment: III - A patient with severe                            systemic disease. After reviewing the risks and                            benefits, the patient was deemed in satisfactory                            condition to undergo the procedure.  After obtaining informed consent, the colonoscope                            was passed under direct vision. Throughout the                            procedure, the patient's blood pressure, pulse, and                            oxygen saturations were monitored continuously. The                            Olympus Scope SN [id number] was introduced through the                            anus and advanced to the the  cecum, identified by                            appendiceal orifice and ileocecal valve. The                            colonoscopy was performed without difficulty. The                            patient tolerated the procedure well. The quality                            of the bowel preparation was good. The ileocecal                            valve, appendiceal orifice, and rectum were                            photographed. The bowel preparation used was SUPREP                            via split dose instruction. Scope In: 10:58:37 AM Scope Out: 11:16:45 AM Scope Withdrawal Time: 0 hours 15 minutes 46 seconds  Total Procedure Duration: 0 hours 18 minutes 8 seconds  Findings:                 The perianal and digital rectal examinations were                            normal.                           An 8 to 10 mm polyp was found in the cecum. The                            polyp was flat. The polyp was removed with a cold                            snare. Resection and retrieval were complete.  Verification of patient identification for the                            specimen was done. Estimated blood loss was minimal.                           A 4 mm polyp was found in the transverse colon. The                            polyp was sessile. The polyp was removed with a                            cold snare. Resection and retrieval were complete.                            Verification of patient identification for the                            specimen was done. Estimated blood loss was minimal.                           A 1 mm polyp was found in the transverse colon. The                            polyp was sessile. The polyp was removed with a                            cold biopsy forceps. Resection and retrieval were                            complete. Verification of patient identification                            for the specimen was done.  Estimated blood loss was                            minimal.                           Multiple diverticula were found in the sigmoid                            colon.                           The exam was otherwise without abnormality on                            direct and retroflexion views. Complications:            No immediate complications. Estimated Blood Loss:     Estimated blood loss was minimal. Impression:               - One 8 to 10 mm polyp in the cecum, removed with a  cold snare. Resected and retrieved.                           - One 4 mm polyp in the transverse colon, removed                            with a cold snare. Resected and retrieved.                           - One 1 mm polyp in the transverse colon, removed                            with a cold biopsy forceps. Resected and retrieved.                           - Diverticulosis in the sigmoid colon.                           - The examination was otherwise normal on direct                            and retroflexion views. Recommendation:           - Patient has a contact number available for                            emergencies. The signs and symptoms of potential                            delayed complications were discussed with the                            patient. Return to normal activities tomorrow.                            Written discharge instructions were provided to the                            patient.                           - Resume previous diet.                           - Continue present medications.                           - Await pathology results. Iva Boop, MD 08/22/2023 11:27:25 AM This report has been signed electronically.

## 2023-08-22 NOTE — Progress Notes (Signed)
Called to room to assist during endoscopic procedure.  Patient ID and intended procedure confirmed with present staff. Received instructions for my participation in the procedure from the performing physician.  

## 2023-08-22 NOTE — Patient Instructions (Addendum)
I found and removed 3 polyps - largest about 8-10 mm.  Also saw diverticulosis.  I will let you know pathology results and when to have another routine colonoscopy by mail and/or My Chart.  I appreciate the opportunity to care for you. Iva Boop, MD, FACG  YOU HAD AN ENDOSCOPIC PROCEDURE TODAY AT THE [facility]:   Refer to the procedure report that was given to you for any specific questions about what was found during the examination.  If the procedure report does not answer your questions, please call your gastroenterologist to clarify.  If you requested that your care partner not be given the details of your procedure findings, then the procedure report has been included in a sealed envelope for you to review at your convenience later.  YOU SHOULD EXPECT: Some feelings of bloating in the abdomen. Passage of more gas than usual.  Walking can help get rid of the air that was put into your GI tract during the procedure and reduce the bloating. If you had a lower endoscopy (such as a colonoscopy or flexible sigmoidoscopy) you may notice spotting of blood in your stool or on the toilet paper. If you underwent a bowel prep for your procedure, you may not have a normal bowel movement for a few days.  Please Note:  You might notice some irritation and congestion in your nose or some drainage.  This is from the oxygen used during your procedure.  There is no need for concern and it should clear up in a day or so.  SYMPTOMS TO REPORT IMMEDIATELY:  Following lower endoscopy (colonoscopy or flexible sigmoidoscopy):  Excessive amounts of blood in the stool  Significant tenderness or worsening of abdominal pains  Swelling of the abdomen that is new, acute  Fever of 100F or higher  For urgent or emergent issues, a gastroenterologist can be reached at any hour by calling [phone]. Do not use MyChart messaging for urgent concerns.    DIET:  We do recommend a small meal at  first, but then you may proceed to your regular diet.  Drink plenty of fluids but you should avoid alcoholic beverages for 24 hours.  ACTIVITY:  You should plan to take it easy for the rest of today and you should NOT DRIVE or use heavy machinery until tomorrow (because of the sedation medicines used during the test).    FOLLOW UP: Our staff will call the number listed on your records the next business day following your procedure.  We will call around 7:15- 8:00 am to check on you and address any questions or concerns that you may have regarding the information given to you following your procedure. If we do not reach you, we will leave a message.     If any biopsies were taken you will be contacted by phone or by letter within the next 1-3 weeks.  Please call us at [phone] if you have not heard about the biopsies in 3 weeks.    SIGNATURES/CONFIDENTIALITY: You and/or your care partner have signed paperwork which will be entered into your electronic medical record.  These signatures attest to the fact that that the information above on your After Visit Summary has been reviewed and is understood.  Full responsibility of the confidentiality of this discharge information lies with you and/or your care-partner.

## 2023-08-22 NOTE — Progress Notes (Signed)
Pt's states no medical or surgical changes since previsit or office visit. 

## 2023-08-24 ENCOUNTER — Other Ambulatory Visit: Payer: Self-pay | Admitting: Internal Medicine

## 2023-08-25 ENCOUNTER — Other Ambulatory Visit (HOSPITAL_COMMUNITY): Payer: Self-pay

## 2023-08-25 ENCOUNTER — Telehealth: Payer: Self-pay

## 2023-08-25 ENCOUNTER — Other Ambulatory Visit: Payer: Self-pay | Admitting: Internal Medicine

## 2023-08-25 MED ORDER — ATORVASTATIN CALCIUM 20 MG PO TABS
20.0000 mg | ORAL_TABLET | Freq: Every day | ORAL | 2 refills | Status: DC
  Filled 2023-08-25: qty 90, 90d supply, fill #0
  Filled 2023-12-01: qty 90, 90d supply, fill #1
  Filled 2024-03-06: qty 90, 90d supply, fill #2

## 2023-08-25 NOTE — Telephone Encounter (Signed)
  Follow up Call-     08/22/2023   10:12 AM  Call back number  Post procedure Call Back phone  # [phone]  Permission to leave phone message Yes     Patient questions:  Do you have a fever, pain , or abdominal swelling? No. Pain Score  0 *  Have you tolerated food without any problems? Yes.    Have you been able to return to your normal activities? Yes.    Do you have any questions about your discharge instructions: Diet   No. Medications  No. Follow up visit  No.  Do you have questions or concerns about your Care? No.  Actions: * If pain score is 4 or above: No action needed, pain <4.

## 2023-08-26 ENCOUNTER — Encounter: Payer: Self-pay | Admitting: Internal Medicine

## 2023-08-26 DIAGNOSIS — Z860101 Personal history of adenomatous and serrated colon polyps: Secondary | ICD-10-CM | POA: Insufficient documentation

## 2023-08-26 LAB — SURGICAL PATHOLOGY

## 2023-08-26 MED ORDER — CITALOPRAM HYDROBROMIDE 20 MG PO TABS
20.0000 mg | ORAL_TABLET | Freq: Every day | ORAL | 2 refills | Status: DC
  Filled 2023-08-26: qty 90, 90d supply, fill #0
  Filled 2023-12-01: qty 90, 90d supply, fill #1
  Filled 2024-02-25: qty 90, 90d supply, fill #2

## 2023-08-27 ENCOUNTER — Encounter: Payer: Self-pay | Admitting: Internal Medicine

## 2023-08-27 ENCOUNTER — Other Ambulatory Visit (HOSPITAL_COMMUNITY): Payer: Self-pay

## 2023-09-03 DIAGNOSIS — G4733 Obstructive sleep apnea (adult) (pediatric): Secondary | ICD-10-CM | POA: Diagnosis not present

## 2023-09-15 DIAGNOSIS — G4733 Obstructive sleep apnea (adult) (pediatric): Secondary | ICD-10-CM | POA: Diagnosis not present

## 2023-10-03 ENCOUNTER — Encounter: Payer: Self-pay | Admitting: Internal Medicine

## 2023-10-03 DIAGNOSIS — G4733 Obstructive sleep apnea (adult) (pediatric): Secondary | ICD-10-CM | POA: Diagnosis not present

## 2023-10-08 ENCOUNTER — Telehealth: Payer: Commercial Managed Care - PPO | Admitting: Physician Assistant

## 2023-10-08 DIAGNOSIS — R059 Cough, unspecified: Secondary | ICD-10-CM

## 2023-10-08 MED ORDER — BENZONATATE 100 MG PO CAPS: 100.0000 mg | ORAL_CAPSULE | Freq: Three times a day (TID) | ORAL | 0 refills | Status: DC | PRN

## 2023-10-08 MED ORDER — BENZONATATE 100 MG PO CAPS: 100.0000 mg | ORAL_CAPSULE | Freq: Three times a day (TID) | ORAL | 0 refills | Status: AC | PRN

## 2023-10-08 MED ORDER — PREDNISONE 10 MG (21) PO TBPK: ORAL_TABLET | ORAL | 0 refills | Status: DC

## 2023-10-08 NOTE — Progress Notes (Signed)
 E-Visit for Cough   We are sorry that you are not feeling well.  Here is how we plan to help!  Based on your presentation I believe you most likely have A cough due to a virus.  This is called viral bronchitis and is best treated by rest, plenty of fluids and control of the cough.  You may use Ibuprofen or Tylenol as directed to help your symptoms.     In addition you may use A prescription cough medication called Tessalon  Perles 100mg . You may take 1-2 capsules every 8 hours as needed for your cough.  Prednisone  10 mg daily for 6 days (please follow instructions on prescription package)  From your responses in the eVisit questionnaire you describe inflammation in the upper respiratory tract which is causing a significant cough.  This is commonly called Bronchitis and has four common causes:   Allergies Viral Infections Acid Reflux Bacterial Infection Allergies, viruses and acid reflux are treated by controlling symptoms or eliminating the cause. An example might be a cough caused by taking certain blood pressure medications. You stop the cough by changing the medication. Another example might be a cough caused by acid reflux. Controlling the reflux helps control the cough.  USE OF BRONCHODILATOR (RESCUE) INHALERS: There is a risk from using your bronchodilator too frequently.  The risk is that over-reliance on a medication which only relaxes the muscles surrounding the breathing tubes can reduce the effectiveness of medications prescribed to reduce swelling and congestion of the tubes themselves.  Although you feel brief relief from the bronchodilator inhaler, your asthma may actually be worsening with the tubes becoming more swollen and filled with mucus.  This can delay other crucial treatments, such as oral steroid medications. If you need to use a bronchodilator inhaler daily, several times per day, you should discuss this with your provider.  There are probably better treatments that could  be used to keep your asthma under control.     HOME CARE Only take medications as instructed by your medical team. Complete the entire course of an antibiotic. Drink plenty of fluids and get plenty of rest. Avoid close contacts especially the very young and the elderly Cover your mouth if you cough or cough into your sleeve. Always remember to wash your hands A steam or ultrasonic humidifier can help congestion.   GET HELP RIGHT AWAY IF: You develop worsening fever. You become short of breath You cough up blood. Your symptoms persist after you have completed your treatment plan MAKE SURE YOU  Understand these instructions. Will watch your condition. Will get help right away if you are not doing well or get worse.    Thank you for choosing an e-visit.  Your e-visit answers were reviewed by a board certified advanced clinical practitioner to complete your personal care plan. Depending upon the condition, your plan could have included both over the counter or prescription medications.  Please review your pharmacy choice. Make sure the pharmacy is open so you can pick up prescription now. If there is a problem, you may contact your provider through [employer] and have the prescription routed to another pharmacy.  Your safety is important to us . If you have drug allergies check your prescription carefully.   For the next 24 hours you can use MyChart to ask questions about today's visit, request a non-urgent call back, or ask for a work or school excuse. You will get an email in the next two days asking about your experience.  I hope that your e-visit has been valuable and will speed your recovery.  I have spent 5 minutes in review of e-visit questionnaire, review and updating patient chart, medical decision making and response to patient.   Roosvelt Mater, PA-C

## 2023-10-08 NOTE — Progress Notes (Signed)
Pt needs medication sent to a different pharmacy.

## 2023-10-08 NOTE — Addendum Note (Signed)
 Addended byReed Pandy on: 10/08/2023 09:57 AM   Modules accepted: Orders

## 2023-10-11 ENCOUNTER — Telehealth: Payer: Commercial Managed Care - PPO | Admitting: Nurse Practitioner

## 2023-10-11 DIAGNOSIS — J40 Bronchitis, not specified as acute or chronic: Secondary | ICD-10-CM | POA: Diagnosis not present

## 2023-10-11 MED ORDER — PROMETHAZINE-DM 6.25-15 MG/5ML PO SYRP: 5.0000 mL | ORAL_SOLUTION | Freq: Four times a day (QID) | ORAL | 0 refills | Status: DC | PRN

## 2023-10-11 MED ORDER — AZITHROMYCIN 250 MG PO TABS: ORAL_TABLET | ORAL | 0 refills | Status: DC

## 2023-10-11 NOTE — Patient Instructions (Signed)
 Victoria Herman, thank you for joining Haze LELON Servant, NP for today's virtual visit.  While this provider is not your primary care provider (PCP), if your PCP is located in our provider database this encounter information will be shared with them immediately following your visit.   A [facility] MyChart account gives you access to today's visit and all your visits, tests, and labs performed at [facility]  click here if you don't have a [facility] MyChart account or go to [email or web address]  Consent: (Patient) Victoria Herman provided verbal consent for this virtual visit at the beginning of the encounter.  Current Medications:  Current Outpatient Medications:    azithromycin  (ZITHROMAX ) 250 MG tablet, Take 2 tablets on day 1, then 1 tablet daily on days 2 through 5, Disp: 6 tablet, Rfl: 0   promethazine -dextromethorphan (PROMETHAZINE -DM) 6.25-15 MG/5ML syrup, Take 5 mLs by mouth 4 (four) times daily as needed for cough., Disp: 240 mL, Rfl: 0   amLODipine  (NORVASC ) 5 MG tablet, Take 1 tablet (5 mg total) by mouth daily. (Patient not taking: Reported on 08/07/2023), Disp: 30 tablet, Rfl: 2   atorvastatin  (LIPITOR) 20 MG tablet, Take 1 tablet (20 mg total) by mouth daily Monday through Saturday, skip Sunday, Disp: 90 tablet, Rfl: 2   Barberry-Oreg Grape-Goldenseal (BERBERINE COMPLEX PO), Take 2 tablets by mouth daily., Disp: , Rfl:    benzonatate  (TESSALON ) 100 MG capsule, Take 1 capsule (100 mg total) by mouth 3 (three) times daily as needed for up to 7 days., Disp: 21 capsule, Rfl: 0   Calcium  Carb-Cholecalciferol (CALCIUM  600 + D PO), Take 1 tablet by mouth daily. Vit D 12.5, Disp: , Rfl:    cetirizine (ZYRTEC) 10 MG tablet, Take 10 mg by mouth daily., Disp: , Rfl:    Cholecalciferol (VITAMIN D3 SUPER STRENGTH) 50 MCG (2000 UT) CAPS, Take 2,000 Units by mouth daily., Disp: , Rfl:    citalopram  (CELEXA ) 20 MG tablet, Take 1 tablet (20 mg total) by mouth daily., Disp: 90  tablet, Rfl: 2   EPINEPHrine  (EPIPEN  2-PAK) 0.3 mg/0.3 mL IJ SOAJ injection, Inject 0.3 mg into the muscle as needed for systemic reaction, Disp: 2 each, Rfl: 1   hydrocortisone  2.5 % cream, Apply topically 2 (two) times daily. (Patient taking differently: Apply topically as needed.), Disp: 30 g, Rfl: 0   mometasone  (NASONEX ) 50 MCG/ACT nasal spray, Use 2 sprays in each nostril once daily (Patient taking differently: Place into the nose daily as needed.), Disp: 17 g, Rfl: 5   predniSONE  (STERAPRED UNI-PAK 21 TAB) 10 MG (21) TBPK tablet, Take following package directions., Disp: 21 tablet, Rfl: 0   Medications ordered in this encounter:  Meds ordered this encounter  Medications   promethazine -dextromethorphan (PROMETHAZINE -DM) 6.25-15 MG/5ML syrup    Sig: Take 5 mLs by mouth 4 (four) times daily as needed for cough.    Dispense:  240 mL    Refill:  0    Supervising Provider:   BLAISE ALEENE KIDD [[id number]]   azithromycin  (ZITHROMAX ) 250 MG tablet    Sig: Take 2 tablets on day 1, then 1 tablet daily on days 2 through 5    Dispense:  6 tablet    Refill:  0    Patient has taken azithromycin  before with no side effects    Supervising Provider:   BLAISE ALEENE KIDD [[id number]]     *If you need refills on other medications prior to your next appointment, please contact your pharmacy*  Follow-Up: Call  back or seek an in-person evaluation if the symptoms worsen or if the condition fails to improve as anticipated.  [facility] [phone]  Other Instructions INSTRUCTIONS: use a humidifier for nasal congestion Drink plenty of fluids, rest and wash hands frequently to avoid the spread of infection Alternate tylenol and Motrin for relief of fever    If you have been instructed to have an in-person evaluation today at a local Urgent Care facility, please use the link below. It will take you to a list of all of our available [facility], including address, phone number and  hours of operation. Please do not delay care.  [facility]  If you or a family member do not have a primary care provider, use the link below to schedule a visit and establish care. When you choose a [facility] primary care physician or advanced practice provider, you gain a long-term partner in health. Find a Primary Care Provider  Learn more about [facility]'s in-office and virtual care options: [facility] - Get Care Now

## 2023-10-11 NOTE — Progress Notes (Signed)
 Virtual Visit Consent   Victoria Herman, you are scheduled for a virtual visit with a [facility] provider today. Just as with appointments in the office, your consent must be obtained to participate. Your consent will be active for this visit and any virtual visit you may have with one of our providers in the next 365 days. If you have a MyChart account, a copy of this consent can be sent to you electronically.  As this is a virtual visit, video technology does not allow for your provider to perform a traditional examination. This may limit your provider's ability to fully assess your condition. If your provider identifies any concerns that need to be evaluated in person or the need to arrange testing (such as labs, EKG, etc.), we will make arrangements to do so. Although advances in technology are sophisticated, we cannot ensure that it will always work on either your end or our end. If the connection with a video visit is poor, the visit may have to be switched to a telephone visit. With either a video or telephone visit, we are not always able to ensure that we have a secure connection.  By engaging in this virtual visit, you consent to the provision of healthcare and authorize for your insurance to be billed (if applicable) for the services provided during this visit. Depending on your insurance coverage, you may receive a charge related to this service.  I need to obtain your verbal consent now. Are you willing to proceed with your visit today? Victoria Herman has provided verbal consent on 10/11/2023 for a virtual visit (video or telephone). Haze LELON Servant, NP  Date: 10/11/2023 8:27 AM  Virtual Visit via Video Note   I, Haze LELON Servant, connected with  Victoria Herman  ([id number], September 17, 1961) on 10/11/23 at  8:30 AM EST by a video-enabled telemedicine application and verified that I am speaking with the correct person using two identifiers.  Location: Patient: Virtual Visit Location Patient:  Home Provider: Virtual Visit Location Provider: Home Office   I discussed the limitations of evaluation and management by telemedicine and the availability of in person appointments. The patient expressed understanding and agreed to proceed.    History of Present Illness: Victoria Herman is a 63 y.o. who identifies as a female who was assigned female at birth, and is being seen today for bronchitis.  Victoria Herman has been experiencing a productive, persistent wet Cough for >1 week. She has already been treated with prednisone  and tessalon  with symptoms seeming to worsen over the past few days. She has had bronchitis before and treated with zpak and current symptoms are worse than prior bronchitis symptoms. She does have an exposure to mold and cigarette smoke as well. Notes right ear pain and yellow sputum production.    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   Obesity (BMI 30.0-34.9) 01/28/2023   Snoring 12/17/2022   Pure hypercholesterolemia 04/17/2022   Raynaud's phenomenon without gangrene 03/13/2022   Primary osteoarthritis of both hands 03/13/2022   Stress incontinence in female 04/10/2021   Abnormal serum iron level 04/10/2021  Anxiety 04/10/2021   Class 1 obesity due to excess calories with serious comorbidity and body mass index (BMI) of 33.0 to 33.9 in adult 04/10/2021   Superficial thrombophlebitis 03/21/2014    Allergies:  Allergies  Allergen Reactions   E-Mycin [Erythromycin] Diarrhea   Sulfa Antibiotics Rash   Medications:  Current Outpatient Medications:    azithromycin   (ZITHROMAX ) 250 MG tablet, Take 2 tablets on day 1, then 1 tablet daily on days 2 through 5, Disp: 6 tablet, Rfl: 0   promethazine -dextromethorphan (PROMETHAZINE -DM) 6.25-15 MG/5ML syrup, Take 5 mLs by mouth 4 (four) times daily as needed for cough., Disp: 240 mL, Rfl: 0   amLODipine  (NORVASC ) 5 MG tablet, Take 1 tablet (5 mg total) by mouth daily. (Patient not taking: Reported on 08/07/2023), Disp: 30 tablet, Rfl: 2   atorvastatin  (LIPITOR) 20 MG tablet, Take 1 tablet (20 mg total) by mouth daily Monday through Saturday, skip Sunday, Disp: 90 tablet, Rfl: 2   Barberry-Oreg Grape-Goldenseal (BERBERINE COMPLEX PO), Take 2 tablets by mouth daily., Disp: , Rfl:    benzonatate  (TESSALON ) 100 MG capsule, Take 1 capsule (100 mg total) by mouth 3 (three) times daily as needed for up to 7 days., Disp: 21 capsule, Rfl: 0   Calcium  Carb-Cholecalciferol (CALCIUM  600 + D PO), Take 1 tablet by mouth daily. Vit D 12.5, Disp: , Rfl:    cetirizine (ZYRTEC) 10 MG tablet, Take 10 mg by mouth daily., Disp: , Rfl:    Cholecalciferol (VITAMIN D3 SUPER STRENGTH) 50 MCG (2000 UT) CAPS, Take 2,000 Units by mouth daily., Disp: , Rfl:    citalopram  (CELEXA ) 20 MG tablet, Take 1 tablet (20 mg total) by mouth daily., Disp: 90 tablet, Rfl: 2   EPINEPHrine  (EPIPEN  2-PAK) 0.3 mg/0.3 mL IJ SOAJ injection, Inject 0.3 mg into the muscle as needed for systemic reaction, Disp: 2 each, Rfl: 1   hydrocortisone  2.5 % cream, Apply topically 2 (two) times daily. (Patient taking differently: Apply topically as needed.), Disp: 30 g, Rfl: 0   mometasone  (NASONEX ) 50 MCG/ACT nasal spray, Use 2 sprays in each nostril once daily (Patient taking differently: Place into the nose daily as needed.), Disp: 17 g, Rfl: 5   predniSONE  (STERAPRED UNI-PAK 21 TAB) 10 MG (21) TBPK tablet, Take following package directions., Disp: 21 tablet, Rfl: 0  Observations/Objective: Patient is well-developed, well-nourished in no acute distress.  Resting comfortably   at home.  Head is normocephalic, atraumatic.  No labored breathing. Wet productive cough. Speech is clear and coherent with logical content.  Patient is alert and oriented at baseline.    Assessment and Plan: 1. Bronchitis (Primary) - promethazine -dextromethorphan (PROMETHAZINE -DM) 6.25-15 MG/5ML syrup; Take 5 mLs by mouth 4 (four) times daily as needed for cough.  Dispense: 240 mL; Refill: 0 - azithromycin  (ZITHROMAX ) 250 MG tablet; Take 2 tablets on day 1, then 1 tablet daily on days 2 through 5  Dispense: 6 tablet; Refill: 0  INSTRUCTIONS: use a humidifier for nasal congestion Drink plenty of fluids, rest and wash hands frequently to avoid the spread of infection Alternate tylenol and Motrin for relief of fever   Follow Up Instructions: I discussed the assessment and treatment plan with the patient. The patient was provided an opportunity to ask questions and all were answered. The patient agreed with the plan and demonstrated an understanding of the instructions.  A copy of instructions were sent to the patient via MyChart unless otherwise noted below.   The patient was advised to call back  or seek an in-person evaluation if the symptoms worsen or if the condition fails to improve as anticipated.    Alaric Gladwin W Gwenevere Goga, NP

## 2023-10-13 ENCOUNTER — Ambulatory Visit
Admission: RE | Admit: 2023-10-13 | Discharge: 2023-10-13 | Disposition: A | Discharge status: Home / Self Care | Payer: Commercial Managed Care - PPO | Source: Ambulatory Visit | Attending: Family Medicine | Admitting: Family Medicine

## 2023-10-13 ENCOUNTER — Other Ambulatory Visit (HOSPITAL_COMMUNITY): Payer: Self-pay

## 2023-10-13 ENCOUNTER — Ambulatory Visit (INDEPENDENT_AMBULATORY_CARE_PROVIDER_SITE_OTHER): Payer: Commercial Managed Care - PPO

## 2023-10-13 VITALS — BP 143/84 | HR 76 | Temp 98.3°F | Resp 18

## 2023-10-13 DIAGNOSIS — J22 Unspecified acute lower respiratory infection: Secondary | ICD-10-CM

## 2023-10-13 DIAGNOSIS — R062 Wheezing: Secondary | ICD-10-CM | POA: Diagnosis not present

## 2023-10-13 DIAGNOSIS — R059 Cough, unspecified: Secondary | ICD-10-CM | POA: Diagnosis not present

## 2023-10-13 MED ORDER — PREDNISONE 20 MG PO TABS
20.0000 mg | ORAL_TABLET | Freq: Every day | ORAL | 0 refills | Status: AC
  Filled 2023-10-13: qty 3, 3d supply, fill #0

## 2023-10-13 MED ORDER — AMOXICILLIN-POT CLAVULANATE 875-125 MG PO TABS
1.0000 | ORAL_TABLET | Freq: Two times a day (BID) | ORAL | 0 refills | Status: DC
  Filled 2023-10-13: qty 14, 7d supply, fill #0

## 2023-10-13 NOTE — ED Triage Notes (Signed)
 Patient presents to the office for follow up on cough x 1 week. Patient reports she is wheezing at night.

## 2023-10-13 NOTE — ED Provider Notes (Signed)
 [facility]    CSN: [id number] Arrival date & time: 10/13/23  1238      History   Chief Complaint Chief Complaint  Patient presents with   Cough    Did Virtual visit  and they mentioned follow up in person on Monday. - Entered by patient    HPI Victoria Herman is a 63 y.o. female.  Patient presents for evaluation of cough and wheezing x 1 week.  Patient evaluated during a telemedicine visit initially on 10/08/2023 treated with a steroid taper and subsequently had a follow-up video visit on 10/11/2023 in which she reported worsening of symptoms and was placed on azithromycin .  Reports she is having difficulty sleeping due to worsening of wheezing and gesturing in the chest. Has a history of chronic seasonal allergies however has no history of chronic bronchitis or asthma.  Patient has been taking all medications that have been prescribed to her as directed and completed last dose of prednisone  yesterday. Past Medical History:  Diagnosis Date   Allergy    Anxiety    Constipation    GERD (gastroesophageal reflux disease)    Hyperlipidemia    Osteoarthritis    Seasonal allergies    Sleep apnea    Superficial thrombophlebitis 03-2014, 07-2014   Swallowing difficulty    Vitamin D  deficiency     Patient Active Problem List   Diagnosis Date Noted   Hx of adenomatous and sessile serrated colonic polyps 08/26/2023   Other fatigue 02/20/2023   SOB (shortness of breath) on exertion 02/20/2023   Other hyperlipidemia 02/20/2023   Routine general medical examination at health care facility 02/20/2023   Generalized obesity 02/20/2023   BMI 33.0-33.9,adult 02/20/2023   Sleep apnea 02/18/2023   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   Obesity (BMI 30.0-34.9) 01/28/2023   Snoring 12/17/2022   Pure hypercholesterolemia 04/17/2022   Raynaud's phenomenon without gangrene 03/13/2022   Primary osteoarthritis of both hands 03/13/2022   Stress incontinence in female 04/10/2021   Abnormal serum  iron level 04/10/2021   Anxiety 04/10/2021   Class 1 obesity due to excess calories with serious comorbidity and body mass index (BMI) of 33.0 to 33.9 in adult 04/10/2021   Superficial thrombophlebitis 03/21/2014    Past Surgical History:  Procedure Laterality Date   BROW LIFT  03/11/2023   COLONOSCOPY     ORIF WRIST FRACTURE Left 04/12/2020   Procedure: OPEN REDUCTION INTERNAL FIXATION (ORIF) WRIST FRACTURE and repair reconstruction as indicated;  Surgeon: Shari Easter, MD;  Location: [facility] OR;  Service: Orthopedics;  Laterality: Left;  with IV sedation  Needs 2 hours   WISDOM TOOTH EXTRACTION      OB History     Gravida  1   Para      Term      Preterm      AB      Living         SAB      IAB      Ectopic      Multiple      Live Births               Home Medications    Prior to Admission medications   Medication Sig Start Date End Date Taking? Authorizing Provider  amLODipine  (NORVASC ) 5 MG tablet Take 1 tablet (5 mg total) by mouth daily. 10/30/21  Yes Deveshwar, Maya, MD  amoxicillin -clavulanate (AUGMENTIN ) 875-125 MG tablet Take 1 tablet by mouth every 12 (twelve) hours. 10/13/23  Yes Arloa Iha  S, NP  atorvastatin  (LIPITOR) 20 MG tablet Take 1 tablet (20 mg total) by mouth daily Monday through Saturday, skip Sunday 08/25/23  Yes Jarold Medici, MD  Barberry-Oreg Grape-Goldenseal (BERBERINE COMPLEX PO) Take 2 tablets by mouth daily.   Yes [provider]  benzonatate  (TESSALON ) 100 MG capsule Take 1 capsule (100 mg total) by mouth 3 (three) times daily as needed for up to 7 days. 10/08/23 10/15/23 Yes Reyes, Roosvelt, PA-C  Calcium  Carb-Cholecalciferol (CALCIUM  600 + D PO) Take 1 tablet by mouth daily. Vit D 12.5   Yes [provider]  cetirizine (ZYRTEC) 10 MG tablet Take 10 mg by mouth daily.   Yes [provider]  Cholecalciferol (VITAMIN D3 SUPER STRENGTH) 50 MCG (2000 UT) CAPS Take 2,000 Units by mouth daily.   Yes [provider]  citalopram  (CELEXA ) 20 MG tablet Take 1 tablet (20 mg total) by mouth daily. 08/26/23 08/25/24 Yes Jarold Medici, MD  EPINEPHrine  (EPIPEN  2-PAK) 0.3 mg/0.3 mL IJ SOAJ injection Inject 0.3 mg into the muscle as needed for systemic reaction 04/02/23  Yes   predniSONE  (DELTASONE ) 20 MG tablet Take 1 tablet (20 mg total) by mouth daily with breakfast for 3 days. 10/13/23 10/16/23 Yes Arloa Suzen RAMAN, NP  promethazine -dextromethorphan (PROMETHAZINE -DM) 6.25-15 MG/5ML syrup Take 5 mLs by mouth 4 (four) times daily as needed for cough. 10/11/23  Yes Theotis Haze ORN, NP  hydrocortisone  2.5 % cream Apply topically 2 (two) times daily. Patient taking differently: Apply topically as needed. 02/05/22   Gladis Elsie BROCKS, PA-C  mometasone  (NASONEX ) 50 MCG/ACT nasal spray Use 2 sprays in each nostril once daily Patient taking differently: Place into the nose daily as needed. 03/28/21   Fleeta Rocky Lamar BROCKS, MD    Family History Family History  Problem Relation Age of Onset   Hypertension Mother    Diabetes Mother    Varicose Veins Mother    Hyperlipidemia Mother    Cancer Mother    Varicose Veins Father    Cancer Father    Hypertension Sister    Diabetes Sister    Thyroid  disease Sister    Varicose Veins Brother    Hypertension Brother    Hypertension Brother    Varicose Veins Brother    Healthy Daughter    Migraines Daughter    Colon cancer Neg Hx    Esophageal cancer Neg Hx    Rectal cancer Neg Hx    Stomach cancer Neg Hx    Breast cancer Neg Hx     Social History Social History   Tobacco Use   Smoking status: Never    Passive exposure: Past   Smokeless tobacco: Never  Vaping Use   Vaping status: Never Used  Substance Use Topics   Alcohol use: Yes    Comment: occas.   Drug use: Never     Allergies   E-mycin [erythromycin] and Sulfa antibiotics   Review of Systems Review of Systems  Respiratory:  Positive for cough.      Physical Exam Triage Vital  Signs ED Triage Vitals [10/13/23 1251]  Encounter Vitals Group     BP (!) 143/84     Systolic BP Percentile      Diastolic BP Percentile      Pulse Rate 76     Resp 18     Temp 98.3 F (36.8 C)     Temp Source Oral     SpO2 98 %     Weight  Height      Head Circumference      Peak Flow      Pain Score      Pain Loc      Pain Education      Exclude from Growth Chart    No data found.  Updated Vital Signs BP (!) 143/84 (BP Location: Left Arm)   Pulse 76   Temp 98.3 F (36.8 C) (Oral)   Resp 18   LMP 07/23/2013   SpO2 98%   Visual Acuity Right Eye Distance:   Left Eye Distance:   Bilateral Distance:    Right Eye Near:   Left Eye Near:    Bilateral Near:     Physical Exam Constitutional:      Appearance: Normal appearance. She is ill-appearing.  HENT:     Head: Normocephalic and atraumatic.     Nose: No congestion or rhinorrhea.  Eyes:     Extraocular Movements: Extraocular movements intact.     Conjunctiva/sclera: Conjunctivae normal.     Pupils: Pupils are equal, round, and reactive to light.  Cardiovascular:     Rate and Rhythm: Normal rate and regular rhythm.  Pulmonary:     Breath sounds: Wheezing and rhonchi present.  Musculoskeletal:     Cervical back: Normal range of motion and neck supple.  Skin:    General: Skin is warm and dry.  Neurological:     General: No focal deficit present.     Mental Status: She is alert.      UC Treatments / Results  Labs (all labs ordered are listed, but only abnormal results are displayed) Labs Reviewed - No data to display  EKG   Radiology No results found.  Procedures Procedures (including critical care time)  Medications Ordered in UC Medications - No data to display  Initial Impression / Assessment and Plan / UC Course  I have reviewed the triage vital signs and the nursing notes.  Pertinent labs & imaging results that were available during my care of the patient were reviewed by me and  considered in my medical decision making (see chart for details).    Acute lower respiratory infection, discontinue azithromycin  and start Augmentin  twice daily for 7 days.  Start prednisone  20 mg daily for 3 days to reduce wheezing and chest congestion.  Awaiting radiology review of chest x-ray.  Advised that I will follow-up with patient via MyChart regarding chest x-ray results.  Final Clinical Impressions(s) / UC Diagnoses   Final diagnoses:  Acute lower respiratory infection     Discharge Instructions      I will update you with the results of your x-ray once the radiologist has reviewed your imaging.  I am discontinuing the azithromycin  and start Augmentin  daily for 7 days.  Standing prednisone  to 20 mg once daily for the next 3 days relieve symptoms of wheezing and rattling in the chest.     ED Prescriptions     Medication Sig Dispense Auth. Provider   amoxicillin -clavulanate (AUGMENTIN ) 875-125 MG tablet Take 1 tablet by mouth every 12 (twelve) hours. 14 tablet Arloa Suzen RAMAN, NP   predniSONE  (DELTASONE ) 20 MG tablet Take 1 tablet (20 mg total) by mouth daily with breakfast for 3 days. 3 tablet Arloa Suzen RAMAN, NP      PDMP not reviewed this encounter.   Arloa Suzen RAMAN, NP 10/13/23 1358

## 2023-10-13 NOTE — Discharge Instructions (Addendum)
 I will update you with the results of your x-ray once the radiologist has reviewed your imaging.  I am discontinuing the azithromycin  and start Augmentin  daily for 7 days.  Standing prednisone  to 20 mg once daily for the next 3 days relieve symptoms of wheezing and rattling in the chest.

## 2023-10-16 DIAGNOSIS — G4733 Obstructive sleep apnea (adult) (pediatric): Secondary | ICD-10-CM | POA: Diagnosis not present

## 2023-11-03 DIAGNOSIS — G4733 Obstructive sleep apnea (adult) (pediatric): Secondary | ICD-10-CM | POA: Diagnosis not present

## 2023-11-16 DIAGNOSIS — G4733 Obstructive sleep apnea (adult) (pediatric): Secondary | ICD-10-CM | POA: Diagnosis not present

## 2023-11-25 ENCOUNTER — Ambulatory Visit (INDEPENDENT_AMBULATORY_CARE_PROVIDER_SITE_OTHER): Payer: Commercial Managed Care - PPO | Admitting: Internal Medicine

## 2023-11-25 ENCOUNTER — Ambulatory Visit: Admitting: Podiatry

## 2023-11-25 VITALS — BP 122/82 | HR 77 | Temp 98.1°F | Ht 64.0 in | Wt 211.0 lb

## 2023-11-25 DIAGNOSIS — Z2821 Immunization not carried out because of patient refusal: Secondary | ICD-10-CM

## 2023-11-25 DIAGNOSIS — Z6836 Body mass index (BMI) 36.0-36.9, adult: Secondary | ICD-10-CM

## 2023-11-25 DIAGNOSIS — I10 Essential (primary) hypertension: Secondary | ICD-10-CM

## 2023-11-25 DIAGNOSIS — E7800 Pure hypercholesterolemia, unspecified: Secondary | ICD-10-CM | POA: Diagnosis not present

## 2023-11-25 DIAGNOSIS — G4733 Obstructive sleep apnea (adult) (pediatric): Secondary | ICD-10-CM

## 2023-11-25 DIAGNOSIS — I7300 Raynaud's syndrome without gangrene: Secondary | ICD-10-CM | POA: Diagnosis not present

## 2023-11-25 DIAGNOSIS — R635 Abnormal weight gain: Secondary | ICD-10-CM | POA: Diagnosis not present

## 2023-11-25 NOTE — Assessment & Plan Note (Signed)
Chronic, sx are currently stable. She will continue with use of amlodipine 5mg  daily prn, mostly used during winter months.

## 2023-11-25 NOTE — Assessment & Plan Note (Signed)
Chronic, she reports compliance with her CPAP.  She wears at least four hours per night. She has recognized benefit from continued use. She is interested in use of Zepbound to address OSA once her insurance covers it.

## 2023-11-25 NOTE — Assessment & Plan Note (Signed)
She has gained 12 lbs since November 2024.  She has previously done well on GLP-1 therapy; however, her primary insurance no longer covers it.  She is encouraged to resume her regular exercise regimen, aiming for at least 30 minutes five days per week.

## 2023-11-25 NOTE — Assessment & Plan Note (Signed)
Chronic, she will continue with atorvastatin 20mg  daily.  She is encouraged to follow a heart healthy lifestyle.

## 2023-11-25 NOTE — Progress Notes (Signed)
I,Jameka J Llittleton, CMA,acting as a Neurosurgeon for Gwynneth Aliment, MD.,have documented all relevant documentation on the behalf of Gwynneth Aliment, MD,as directed by  Gwynneth Aliment, MD while in the presence of Gwynneth Aliment, MD.  Subjective:  Patient ID: Victoria Herman , female    DOB: 12-May-1961 , 63 y.o.   MRN: [id number]  Chief Complaint  Patient presents with   Hyperlipidemia    HPI  The patient is here today for a follow-up on her cholesterol. She has been taking atorvastatin 20mg  daily without any issues.  She reports compliance with meds. Denies headaches, chest pain and shortness of breath.   She has no specific concerns or complaints at this time.    Hyperlipidemia This is a chronic problem. The current episode started more than 1 year ago. The problem is uncontrolled. Recent lipid tests were reviewed and are high. Exacerbating diseases include obesity. Current antihyperlipidemic treatment includes statins. Compliance problems include adherence to exercise.      Past Medical History:  Diagnosis Date   Allergy    Anxiety    Constipation    GERD (gastroesophageal reflux disease)    Hyperlipidemia    Osteoarthritis    Seasonal allergies    Sleep apnea    Superficial thrombophlebitis 03-2014, 07-2014   Swallowing difficulty    Vitamin D deficiency      Family History  Problem Relation Age of Onset   Hypertension Mother    Diabetes Mother    Varicose Veins Mother    Hyperlipidemia Mother    Cancer Mother    Varicose Veins Father    Cancer Father    Hypertension Sister    Diabetes Sister    Thyroid disease Sister    Varicose Veins Brother    Hypertension Brother    Hypertension Brother    Varicose Veins Brother    Healthy Daughter    Migraines Daughter    Colon cancer Neg Hx    Esophageal cancer Neg Hx    Rectal cancer Neg Hx    Stomach cancer Neg Hx    Breast cancer Neg Hx      Current Outpatient Medications:    amLODipine (NORVASC) 5 MG tablet,  Take 1 tablet (5 mg total) by mouth daily., Disp: 30 tablet, Rfl: 2   atorvastatin (LIPITOR) 20 MG tablet, Take 1 tablet (20 mg total) by mouth daily Monday through Saturday, skip Sunday, Disp: 90 tablet, Rfl: 2   Barberry-Oreg Grape-Goldenseal (BERBERINE COMPLEX PO), Take 2 tablets by mouth daily., Disp: , Rfl:    Calcium Carb-Cholecalciferol (CALCIUM 600 + D PO), Take 1 tablet by mouth daily. Vit D 12.5, Disp: , Rfl:    cetirizine (ZYRTEC) 10 MG tablet, Take 10 mg by mouth daily., Disp: , Rfl:    Cholecalciferol (VITAMIN D3 SUPER STRENGTH) 50 MCG (2000 UT) CAPS, Take 2,000 Units by mouth daily., Disp: , Rfl:    citalopram (CELEXA) 20 MG tablet, Take 1 tablet (20 mg total) by mouth daily., Disp: 90 tablet, Rfl: 2   EPINEPHrine (EPIPEN 2-PAK) 0.3 mg/0.3 mL IJ SOAJ injection, Inject 0.3 mg into the muscle as needed for systemic reaction, Disp: 2 each, Rfl: 1   hydrocortisone 2.5 % cream, Apply topically 2 (two) times daily. (Patient taking differently: Apply topically as needed.), Disp: 30 g, Rfl: 0   mometasone (NASONEX) 50 MCG/ACT nasal spray, Use 2 sprays in each nostril once daily (Patient taking differently: Place into the nose daily as needed.), Disp: 17  g, Rfl: 5   promethazine-dextromethorphan (PROMETHAZINE-DM) 6.25-15 MG/5ML syrup, Take 5 mLs by mouth 4 (four) times daily as needed for cough., Disp: 240 mL, Rfl: 0   Allergies  Allergen Reactions   E-Mycin [Erythromycin] Diarrhea   Sulfa Antibiotics Rash     Review of Systems  Constitutional: Negative.   Eyes: Negative.   Respiratory: Negative.    Cardiovascular: Negative.   Gastrointestinal: Negative.   Musculoskeletal: Negative.   Skin: Negative.   Psychiatric/Behavioral: Negative.       Today's Vitals   11/25/23 1451  BP: 122/82  Pulse: 77  Temp: 98.1 F (36.7 C)  TempSrc: Oral  Weight: 211 lb (95.7 kg)  Height: 5\' 4"  (1.626 m)  PainSc: 0-No pain   Body mass index is 36.22 kg/m.  Wt Readings from Last 3 Encounters:   11/25/23 211 lb (95.7 kg)  08/22/23 199 lb (90.3 kg)  08/07/23 199 lb (90.3 kg)    The 10-year ASCVD risk score (Arnett DK, et al., 2019) is: 2.3%   Values used to calculate the score:     Age: 38 years     Sex: Female     Is Non-Hispanic African American: No     Diabetic: No     Tobacco smoker: No     Systolic Blood Pressure: 122 mmHg     Is BP treated: No     HDL Cholesterol: 77 mg/dL     Total Cholesterol: 160 mg/dL  Objective:  Physical Exam Vitals and nursing note reviewed.  Constitutional:      Appearance: Normal appearance.  HENT:     Head: Normocephalic and atraumatic.  Eyes:     Extraocular Movements: Extraocular movements intact.  Cardiovascular:     Rate and Rhythm: Normal rate and regular rhythm.     Heart sounds: Normal heart sounds.  Pulmonary:     Effort: Pulmonary effort is normal.     Breath sounds: Normal breath sounds.  Musculoskeletal:     Cervical back: Normal range of motion.  Skin:    General: Skin is warm.  Neurological:     General: No focal deficit present.     Mental Status: She is alert.  Psychiatric:        Mood and Affect: Mood normal.        Behavior: Behavior normal.         Assessment And Plan:  Pure hypercholesterolemia Assessment & Plan: Chronic, she will continue with atorvastatin 20mg  daily.  She is encouraged to follow a heart healthy lifestyle.   Orders: -     CMP14+EGFR  Raynaud's phenomenon without gangrene Assessment & Plan: Chronic, sx are currently stable. She will continue with use of amlodipine 5mg  daily prn, mostly used during winter months.    Weight gain Assessment & Plan: She has gained 12 lbs since November 2024.  She has previously done well on GLP-1 therapy; however, her primary insurance no longer covers it.  She is encouraged to resume her regular exercise regimen, aiming for at least 30 minutes five days per week.  Orders: -     Hemoglobin A1c -     TSH  OSA on CPAP Assessment &  Plan: Chronic, she reports compliance with her CPAP.  She wears at least four hours per night. She has recognized benefit from continued use. She is interested in use of Zepbound to address OSA once her insurance covers it.    COVID-19 vaccination declined    Return if symptoms worsen or  fail to improve.  Patient was given opportunity to ask questions. Patient verbalized understanding of the plan and was able to repeat key elements of the plan. All questions were answered to their satisfaction.    I, Gwynneth Aliment, MD, have reviewed all documentation for this visit. The documentation on 11/25/23 for the exam, diagnosis, procedures, and orders are all accurate and complete.   IF YOU HAVE BEEN REFERRED TO A SPECIALIST, IT MAY TAKE 1-2 WEEKS TO SCHEDULE/PROCESS THE REFERRAL. IF YOU HAVE NOT HEARD FROM US/SPECIALIST IN TWO WEEKS, PLEASE GIVE [place] A CALL AT [phone].

## 2023-11-25 NOTE — Patient Instructions (Signed)

## 2023-11-26 ENCOUNTER — Encounter: Payer: Self-pay | Admitting: Internal Medicine

## 2023-11-26 LAB — CMP14+EGFR
ALT: 19 [IU]/L (ref 0–32)
AST: 23 [IU]/L (ref 0–40)
Albumin: 4.4 g/dL (ref 3.9–4.9)
Alkaline Phosphatase: 88 [IU]/L (ref 44–121)
BUN/Creatinine Ratio: 28 (ref 12–28)
BUN: 19 mg/dL (ref 8–27)
Bilirubin Total: 0.6 mg/dL (ref 0.0–1.2)
CO2: 24 mmol/L (ref 20–29)
Calcium: 9.5 mg/dL (ref 8.7–10.3)
Chloride: 103 mmol/L (ref 96–106)
Creatinine, Ser: 0.68 mg/dL (ref 0.57–1.00)
Globulin, Total: 2.1 g/dL (ref 1.5–4.5)
Glucose: 82 mg/dL (ref 70–99)
Potassium: 5.1 mmol/L (ref 3.5–5.2)
Sodium: 141 mmol/L (ref 134–144)
Total Protein: 6.5 g/dL (ref 6.0–8.5)
eGFR: 99 mL/min/{1.73_m2} (ref >59)

## 2023-11-26 LAB — HEMOGLOBIN A1C
Est. average glucose Bld gHb Est-mCnc: 120 mg/dL
Hgb A1c MFr Bld: 5.8 % — ABNORMAL HIGH (ref 4.8–5.6)

## 2023-11-26 LAB — TSH: TSH: 2.16 u[IU]/mL (ref 0.450–4.500)

## 2023-12-04 ENCOUNTER — Ambulatory Visit: Payer: Commercial Managed Care - PPO | Admitting: Podiatry

## 2023-12-04 DIAGNOSIS — M7752 Other enthesopathy of left foot: Secondary | ICD-10-CM

## 2023-12-04 DIAGNOSIS — M722 Plantar fascial fibromatosis: Secondary | ICD-10-CM

## 2023-12-04 DIAGNOSIS — L600 Ingrowing nail: Secondary | ICD-10-CM

## 2023-12-04 DIAGNOSIS — E559 Vitamin D deficiency, unspecified: Secondary | ICD-10-CM | POA: Insufficient documentation

## 2023-12-04 DIAGNOSIS — K219 Gastro-esophageal reflux disease without esophagitis: Secondary | ICD-10-CM | POA: Insufficient documentation

## 2023-12-04 DIAGNOSIS — E611 Iron deficiency: Secondary | ICD-10-CM | POA: Insufficient documentation

## 2023-12-04 DIAGNOSIS — G479 Sleep disorder, unspecified: Secondary | ICD-10-CM | POA: Insufficient documentation

## 2023-12-04 DIAGNOSIS — N951 Menopausal and female climacteric states: Secondary | ICD-10-CM | POA: Insufficient documentation

## 2023-12-04 DIAGNOSIS — M2550 Pain in unspecified joint: Secondary | ICD-10-CM | POA: Insufficient documentation

## 2023-12-04 NOTE — Progress Notes (Signed)
  Subjective:  Patient ID: Victoria Herman, female    DOB: 06-15-1961,  MRN: [id number]  Chief Complaint  Patient presents with   Callouses    Pt stated that she has a place on the outside of her left foot that has been causing her some discomfort she stated that it feels like a callus.    Discussed the use of AI scribe software for clinical note transcription with the patient, who gave verbal consent to proceed.  History of Present Illness   Victoria A Pinder "Dewayne Hatch" is a 63 year old female who presents with multiple foot complaints including possible inguinal toenail issues, painful arch, plantar fasciitis type symptoms, and pain at the base of the fifth metatarsal.  She experiences discomfort due to incurvated toenails on her big toes. There is no associated paronychia. The toenails are causing discomfort but not significant issues at present.  She has pain in the arch of her right foot, described as similar to plantar fasciitis symptoms. Additionally, there is a small plantar fibroma on the plantar medial band of the plantar fascia on the right foot. The symptoms have been persistent, though the duration is unspecified.  On the left foot, she experiences pain at the base of the fifth metatarsal. There is a prominent fifth metatarsal with callus formation, and she suspects a possible bursa in this area. The pain is minimal but noticeable.  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          Objective:    Physical Exam   MUSCULOSKELETAL: Prominent fifth metatarsal with callus and possible bursa on left foot, minimally painful. Small plantar fibroma on plantar medial band of right foot. Incurvated nails without paronychia.       No images are attached to the encounter.    Results          Assessment:   1. Bursitis of left foot   2. Ingrowing left great toenail   3. Ingrowing right great toenail   4. Plantar fibromatosis      Plan:  Patient was evaluated and  treated and all questions answered.  Assessment and Plan    Ingrown Toenails Incurvated nails without paronychia. -Will monitor and patient to notify if condition worsens.  Painful Arch/Plantar Fasciitis Small plantar fibroma on the plantar medial band of the right foot. -Discussed injection therapy as a potential treatment. -Recommended appropriate shoe gear including running shoes, power steps, and super feet. -Will monitor and patient to return for injection if needed.  Pain at [facility] of Fifth Metatarsal Prominent fifth metatarsal on the left foot with callus formation and possible bursitis. -Discussed injection therapy as a potential treatment. -Will monitor and patient to return for injection if needed.          No follow-ups on file.

## 2023-12-04 NOTE — Patient Instructions (Signed)
 The shoes we discussed are Altra via Olymp[employer], and the inserts or power steps or Superfeet  Plantar Fasciitis (Heel Spur Syndrome) with Rehab The plantar fascia is a fibrous, ligament-like, soft-tissue structure that spans the bottom of the foot. Plantar fasciitis is a condition that causes pain in the foot due to inflammation of the tissue. SYMPTOMS  Pain and tenderness on the underneath side of the foot. Pain that worsens with standing or walking. CAUSES  Plantar fasciitis is caused by irritation and injury to the plantar fascia on the underneath side of the foot. Common mechanisms of injury include: Direct trauma to bottom of the foot. Damage to a small nerve that runs under the foot where the main fascia attaches to the heel bone. Stress placed on the plantar fascia due to bone spurs. RISK INCREASES WITH:  Activities that place stress on the plantar fascia (running, jumping, pivoting, or cutting). Poor strength and flexibility. Improperly fitted shoes. Tight calf muscles. Flat feet. Failure to warm-up properly before activity. Obesity. PREVENTION Warm up and stretch properly before activity. Allow for adequate recovery between workouts. Maintain physical fitness: Strength, flexibility, and endurance. Cardiovascular fitness. Maintain a health body weight. Avoid stress on the plantar fascia. Wear properly fitted shoes, including arch supports for individuals who have flat feet.  PROGNOSIS  If treated properly, then the symptoms of plantar fasciitis usually resolve without surgery. However, occasionally surgery is necessary.  RELATED COMPLICATIONS  Recurrent symptoms that may result in a chronic condition. Problems of the lower back that are caused by compensating for the injury, such as limping. Pain or weakness of the foot during push-off following surgery. Chronic inflammation, scarring, and partial or complete fascia tear, occurring more often from repeated  injections.  TREATMENT  Treatment initially involves the use of ice and medication to help reduce pain and inflammation. The use of strengthening and stretching exercises may help reduce pain with activity, especially stretches of the Achilles tendon. These exercises may be performed at home or with a therapist. Your caregiver may recommend that you use heel cups of arch supports to help reduce stress on the plantar fascia. Occasionally, corticosteroid injections are given to reduce inflammation. If symptoms persist for greater than 6 months despite non-surgical (conservative), then surgery may be recommended.   MEDICATION  If pain medication is necessary, then nonsteroidal anti-inflammatory medications, such as aspirin and ibuprofen, or other minor pain relievers, such as acetaminophen, are often recommended. Do not take pain medication within 7 days before surgery. Prescription pain relievers may be given if deemed necessary by your caregiver. Use only as directed and only as much as you need. Corticosteroid injections may be given by your caregiver. These injections should be reserved for the most serious cases, because they may only be given a certain number of times.  HEAT AND COLD Cold treatment (icing) relieves pain and reduces inflammation. Cold treatment should be applied for 10 to 15 minutes every 2 to 3 hours for inflammation and pain and immediately after any activity that aggravates your symptoms. Use ice packs or massage the area with a piece of ice (ice massage). Heat treatment may be used prior to performing the stretching and strengthening activities prescribed by your caregiver, physical therapist, or athletic trainer. Use a heat pack or soak the injury in warm water.  SEEK IMMEDIATE MEDICAL CARE IF: Treatment seems to offer no benefit, or the condition worsens. Any medications produce adverse side effects.  EXERCISES- RANGE OF MOTION (ROM) AND STRETCHING EXERCISES - Plantar  Fasciitis (Heel Spur Syndrome) These exercises may help you when beginning to rehabilitate your injury. Your symptoms may resolve with or without further involvement from your physician, physical therapist or athletic trainer. While completing these exercises, remember:  Restoring tissue flexibility helps normal motion to return to the joints. This allows healthier, less painful movement and activity. An effective stretch should be held for at least 30 seconds. A stretch should never be painful. You should only feel a gentle lengthening or release in the stretched tissue.  RANGE OF MOTION - Toe Extension, Flexion Sit with your right / left leg crossed over your opposite knee. Grasp your toes and gently pull them back toward the top of your foot. You should feel a stretch on the bottom of your toes and/or foot. Hold this stretch for 10 seconds. Now, gently pull your toes toward the bottom of your foot. You should feel a stretch on the top of your toes and or foot. Hold this stretch for 10 seconds. Repeat  times. Complete this stretch 3 times per day.   RANGE OF MOTION - Ankle Dorsiflexion, Active Assisted Remove shoes and sit on a chair that is preferably not on a carpeted surface. Place right / left foot under knee. Extend your opposite leg for support. Keeping your heel down, slide your right / left foot back toward the chair until you feel a stretch at your ankle or calf. If you do not feel a stretch, slide your bottom forward to the edge of the chair, while still keeping your heel down. Hold this stretch for 10 seconds. Repeat 3 times. Complete this stretch 2 times per day.   STRETCH  Gastroc, Standing Place hands on wall. Extend right / left leg, keeping the front knee somewhat bent. Slightly point your toes inward on your back foot. Keeping your right / left heel on the floor and your knee straight, shift your weight toward the wall, not allowing your back to arch. You should feel a  gentle stretch in the right / left calf. Hold this position for 10 seconds. Repeat 3 times. Complete this stretch 2 times per day.  STRETCH  Soleus, Standing Place hands on wall. Extend right / left leg, keeping the other knee somewhat bent. Slightly point your toes inward on your back foot. Keep your right / left heel on the floor, bend your back knee, and slightly shift your weight over the back leg so that you feel a gentle stretch deep in your back calf. Hold this position for 10 seconds. Repeat 3 times. Complete this stretch 2 times per day.  STRETCH  Gastrocsoleus, Standing  Note: This exercise can place a lot of stress on your foot and ankle. Please complete this exercise only if specifically instructed by your caregiver.  Place the ball of your right / left foot on a step, keeping your other foot firmly on the same step. Hold on to the wall or a rail for balance. Slowly lift your other foot, allowing your body weight to press your heel down over the edge of the step. You should feel a stretch in your right / left calf. Hold this position for 10 seconds. Repeat this exercise with a slight bend in your right / left knee. Repeat 3 times. Complete this stretch 2 times per day.   STRENGTHENING EXERCISES - Plantar Fasciitis (Heel Spur Syndrome)  These exercises may help you when beginning to rehabilitate your injury. They may resolve your symptoms with or without further involvement  from your physician, physical therapist or athletic trainer. While completing these exercises, remember:  Muscles can gain both the endurance and the strength needed for everyday activities through controlled exercises. Complete these exercises as instructed by your physician, physical therapist or athletic trainer. Progress the resistance and repetitions only as guided.  STRENGTH - Towel Curls Sit in a chair positioned on a non-carpeted surface. Place your foot on a towel, keeping your heel on the  floor. Pull the towel toward your heel by only curling your toes. Keep your heel on the floor. Repeat 3 times. Complete this exercise 2 times per day.  STRENGTH - Ankle Inversion Secure one end of a rubber exercise band/tubing to a fixed object (table, pole). Loop the other end around your foot just before your toes. Place your fists between your knees. This will focus your strengthening at your ankle. Slowly, pull your big toe up and in, making sure the band/tubing is positioned to resist the entire motion. Hold this position for 10 seconds. Have your muscles resist the band/tubing as it slowly pulls your foot back to the starting position. Repeat 3 times. Complete this exercises 2 times per day.  Document Released: 09/23/2005 Document Revised: 12/16/2011 Document Reviewed: 01/05/2009 [facility] Patient Information 2014 [place], [place].

## 2023-12-05 ENCOUNTER — Encounter: Payer: Self-pay | Admitting: Family Medicine

## 2023-12-05 ENCOUNTER — Telehealth: Payer: Commercial Managed Care - PPO | Admitting: Family Medicine

## 2023-12-05 ENCOUNTER — Encounter: Payer: Self-pay | Admitting: Internal Medicine

## 2023-12-05 ENCOUNTER — Other Ambulatory Visit (HOSPITAL_COMMUNITY): Payer: Self-pay

## 2023-12-05 VITALS — BP 128/81 | HR 66 | Temp 96.7°F | Ht 64.0 in | Wt 210.0 lb

## 2023-12-05 DIAGNOSIS — U071 COVID-19: Secondary | ICD-10-CM | POA: Diagnosis not present

## 2023-12-05 MED ORDER — PAXLOVID (150/100) 10 X 150 MG & 10 X 100MG PO TBPK
2.0000 | ORAL_TABLET | Freq: Two times a day (BID) | ORAL | 0 refills | Status: AC
  Filled 2023-12-05: qty 20, 5d supply, fill #0

## 2023-12-05 NOTE — Patient Instructions (Signed)

## 2023-12-05 NOTE — Progress Notes (Addendum)
 Virtual Visit via Video Note  I,Jameka J Llittleton, CMA,acting as a scribe for [employer], NP.,have documented all relevant documentation on the behalf of [name], NP,as directed by  [name], NP while in the presence of [name], NP.  I connected with Victoria Herman on 12/05/2023 at 11:20 AM EST by a video enabled telemedicine application and verified that I am speaking with the correct person using two identifiers.  Patient Location: Home Provider Location: Office/Clinic  I discussed the limitations, risks, security, and privacy concerns of performing an evaluation and management service by video and the availability of in person appointments. I also discussed with the patient that there may be a patient responsible charge related to this service. The patient expressed understanding and agreed to proceed.  Subjective: PCP: Dorothyann Peng, MD  Chief Complaint  Patient presents with   Covid Positive   Patient is a 63 year old female who presents for a virtual visit today with symptoms of nasal congestion, scratchy throat and slight cough. 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  She denies any fever or headaches but wants to be treated.     ROS: Per HPI  Current Outpatient Medications:    amLODipine (NORVASC) 5 MG tablet, Take 1 tablet (5 mg total) by mouth daily., Disp: 30 tablet, Rfl: 2   atorvastatin (LIPITOR) 20 MG tablet, Take 1 tablet (20 mg total) by mouth daily Monday through Saturday, skip Sunday, Disp: 90 tablet, Rfl: 2   Barberry-Oreg Grape-Goldenseal (BERBERINE COMPLEX PO), Take 2 tablets by mouth daily., Disp: , Rfl:    Calcium Carb-Cholecalciferol (CALCIUM 600 + D PO), Take 1 tablet by mouth daily. Vit D 12.5, Disp: , Rfl:    cetirizine (ZYRTEC) 10 MG tablet, Take 10 mg by mouth daily., Disp: , Rfl:    Cholecalciferol (VITAMIN D3 SUPER STRENGTH) 50 MCG (2000  UT) CAPS, Take 2,000 Units by mouth daily., Disp: , Rfl:    citalopram (CELEXA) 20 MG tablet, Take 1 tablet (20 mg total) by mouth daily., Disp: 90 tablet, Rfl: 2   EPINEPHrine (EPIPEN 2-PAK) 0.3 mg/0.3 mL IJ SOAJ injection, Inject 0.3 mg into the muscle as needed for systemic reaction, Disp: 2 each, Rfl: 1   hydrocortisone 2.5 % cream, Apply topically 2 (two) times daily. (Patient taking differently: Apply topically as needed.), Disp: 30 g, Rfl: 0   mometasone (NASONEX) 50 MCG/ACT nasal spray, Use 2 sprays in each nostril once daily (Patient taking differently: Place into the nose daily as needed.), Disp: 17 g, Rfl: 5  Observations/Objective: Today's Vitals   12/05/23 1019  BP: 128/81  Pulse: 66  Temp: (!) 96.7 F (35.9 C)  TempSrc: Oral  Weight: 210 lb (95.3 kg)  Height: 5\' 4"  (1.626 m)  PainSc: 0-No pain   Physical Exam HENT:     Head: Normocephalic.  Pulmonary:     Effort: Pulmonary effort is normal.  Neurological:     Mental Status: She is alert.     Assessment and Plan: COVID-19 Assessment & Plan: Advised to stay off statins while on Paxlovid.  Orders: -     Paxlovid (150/100); Take 2 tablets by mouth 2 (two) times daily for 5 days.  Dispense: 20 tablet; Refill: 0    Follow Up Instructions: Return if symptoms worsen or fail to improve.   I discussed the assessment and treatment plan with the  patient. The patient was provided an opportunity to ask questions, and all were answered. The patient agreed with the plan and demonstrated an understanding of the instructions.   The patient was advised to call back or seek an in-person evaluation if the symptoms worsen or if the condition fails to improve as anticipated.  The above assessment and management plan was discussed with the patient. The patient verbalized understanding of and has agreed to the management plan.  I, [name], NP, have reviewed all documentation for this visit. The documentation on 12/10/2023  for  the exam, diagnosis, procedures, and orders are all accurate and complete.

## 2023-12-10 DIAGNOSIS — U071 COVID-19: Secondary | ICD-10-CM | POA: Insufficient documentation

## 2023-12-10 NOTE — Assessment & Plan Note (Signed)
 Advised to stay off statins while on Paxlovid.

## 2024-03-02 DIAGNOSIS — G4733 Obstructive sleep apnea (adult) (pediatric): Secondary | ICD-10-CM | POA: Diagnosis not present

## 2024-03-03 NOTE — Progress Notes (Signed)
 Office Visit Note  Patient: Victoria Herman             Date of Birth: 08-12-1961           MRN: [id number]             PCP: Cleave Curling, MD Referring: Cleave Curling, MD Visit Date: 03/16/2024 Occupation: @GUAROCC @  Subjective:  Joint stiffness  History of Present Illness: CINDE EBERT is a 63 y.o. female with osteoarthritis and Raynaud's phenomenon.  She returns today after her last visit in June 2024.  She states she continues to have some stiffness in her hands but no joint swelling.  She continues to have Raynaud's phenomenon which is manageable with over-the-counter products.  She has intermittent discomfort in the left trochanteric region for which she has been doing stretching exercises.  She denies history of oral ulcers, nasal ulcers, fatigue, malar rash, photosensitivity, lymphadenopathy or inflammatory arthritis.    Activities of Daily Living:  Patient reports morning stiffness for 10-15 minutes.   Patient Denies nocturnal pain.  Difficulty dressing/grooming: Denies Difficulty climbing stairs: Denies Difficulty getting out of chair: Denies Difficulty using hands for taps, buttons, cutlery, and/or writing: Denies  Review of Systems  Constitutional:  Negative for fatigue.  HENT:  Positive for mouth dryness. Negative for mouth sores.   Eyes:  Negative for dryness.  Respiratory:  Negative for shortness of breath.   Cardiovascular:  Negative for chest pain and palpitations.  Gastrointestinal:  Negative for blood in stool, constipation and diarrhea.  Endocrine: Negative for increased urination.  Genitourinary:  Negative for involuntary urination.  Musculoskeletal:  Positive for morning stiffness. Negative for joint pain, gait problem, joint pain, joint swelling, myalgias, muscle weakness, muscle tenderness and myalgias.  Skin:  Positive for color change. Negative for rash, hair loss and sensitivity to sunlight.  Allergic/Immunologic: Negative for susceptible to  infections.  Neurological:  Negative for dizziness and headaches.  Hematological:  Negative for swollen glands.  Psychiatric/Behavioral:  Negative for depressed mood and sleep disturbance. The patient is not nervous/anxious.     PMFS History:  Patient Active Problem List   Diagnosis Date Noted   COVID-19 12/10/2023   Arthralgia 12/04/2023   Difficulty sleeping 12/04/2023   Gastroesophageal reflux disease 12/04/2023   Iron deficiency 12/04/2023   Menopausal and female climacteric states 12/04/2023   Vitamin D  deficiency 12/04/2023   Weight gain 11/25/2023   Hx of adenomatous and sessile serrated colonic polyps 08/26/2023   Other fatigue 02/20/2023   SOB (shortness of breath) on exertion 02/20/2023   Other hyperlipidemia 02/20/2023   Routine general medical examination at health care facility 02/20/2023   Generalized obesity 02/20/2023   BMI 33.0-33.9,adult 02/20/2023   OSA on CPAP 02/18/2023   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   Obesity (BMI 30.0-34.9) 01/28/2023   Snoring 12/17/2022   Pure hypercholesterolemia 04/17/2022   Raynaud's phenomenon without gangrene 03/13/2022   Primary osteoarthritis of both hands 03/13/2022   Raynaud's disease 02/19/2022   Stress incontinence in female 04/10/2021   Abnormal serum iron level 04/10/2021   Anxiety 04/10/2021   Class 1 obesity due to excess calories with serious comorbidity and body mass index (BMI) of 33.0 to 33.9 in adult 04/10/2021   Superficial thrombophlebitis 03/21/2014    Past Medical History:  Diagnosis Date   Allergy    Anxiety    Constipation    GERD (gastroesophageal reflux disease)    Hyperlipidemia    Osteoarthritis    Seasonal allergies    Sleep apnea  Superficial thrombophlebitis 03-2014, 07-2014   Swallowing difficulty    Vitamin D  deficiency     Family History  Problem Relation Age of Onset   Hypertension Mother    Diabetes Mother    Varicose Veins Mother    Hyperlipidemia Mother    Cancer Mother     Varicose Veins Father    Cancer Father    Hypertension Sister    Diabetes Sister    Thyroid  disease Sister    Varicose Veins Brother    Hypertension Brother    Hypertension Brother    Varicose Veins Brother    Healthy Daughter    Migraines Daughter    Colon cancer Neg Hx    Esophageal cancer Neg Hx    Rectal cancer Neg Hx    Stomach cancer Neg Hx    Breast cancer Neg Hx    Past Surgical History:  Procedure Laterality Date   BROW LIFT  03/11/2023   COLONOSCOPY     ORIF WRIST FRACTURE Left 04/12/2020   Procedure: OPEN REDUCTION INTERNAL FIXATION (ORIF) WRIST FRACTURE and repair reconstruction as indicated;  Surgeon: Arvil Birks, MD;  Location: [facility] OR;  Service: Orthopedics;  Laterality: Left;  with IV sedation  Needs 2 hours   WISDOM TOOTH EXTRACTION     Social History   Social History Narrative   Not on file   Immunization History  Administered Date(s) Administered   Influenza,inj,Quad PF,6+ Mos 07/16/2019, 07/28/2020, 07/20/2021   Influenza-Unspecified 07/17/2017, 07/16/2022   PFIZER(Purple Top)SARS-COV-2 Vaccination 09/30/2019, 10/20/2019, 04/05/2020, 07/21/2020   Tdap 04/17/2018   Zoster Recombinant(Shingrix) 05/12/2018, 07/20/2018     Objective: Vital Signs: BP 110/75 (BP Location: Left Arm, Patient Position: Sitting, Cuff Size: Normal)   Pulse 81   Resp 14   Ht 5\' 4"  (1.626 m)   Wt 212 lb (96.2 kg)   LMP 07/23/2013   BMI 36.39 kg/m    Physical Exam Vitals and nursing note reviewed.  Constitutional:      Appearance: She is well-developed.  HENT:     Head: Normocephalic and atraumatic.  Eyes:     Conjunctiva/sclera: Conjunctivae normal.  Cardiovascular:     Rate and Rhythm: Normal rate and regular rhythm.     Heart sounds: Normal heart sounds.  Pulmonary:     Effort: Pulmonary effort is normal.     Breath sounds: Normal breath sounds.  Abdominal:     General: Bowel sounds are normal.     Palpations: Abdomen is soft.  Musculoskeletal:     Cervical  back: Normal range of motion.  Lymphadenopathy:     Cervical: No cervical adenopathy.  Skin:    General: Skin is warm and dry.     Capillary Refill: Capillary refill takes 2 to 3 seconds.     Comments: Decreased capillary refill was noted.  No telangiectasia or sclerodactyly was noted.  Neurological:     Mental Status: She is alert and oriented to person, place, and time.  Psychiatric:        Behavior: Behavior normal.      Musculoskeletal Exam: Cervical, thoracic and lumbar spine were in good range of motion.  Shoulders, elbows, wrist joints, MCPs PIPs and DIPs with good range of motion.  She had limited extension in the left wrist joint due to previous surgery and had a surgical scar.  Hip joints and knee joints in good range of motion without any warmth swelling or effusion.  There was no tenderness over ankles or MTPs.  CDAI Exam: CDAI  Score: -- Patient Global: --; Provider Global: -- Swollen: --; Tender: -- Joint Exam 03/16/2024   No joint exam has been documented for this visit   There is currently no information documented on the homunculus. Go to the Rheumatology activity and complete the homunculus joint exam.  Investigation: No additional findings.  Imaging: No results found.  Recent Labs: Lab Results  Component Value Date   WBC 6.1 05/21/2023   HGB 14.0 05/21/2023   PLT 256 05/21/2023   NA 141 11/25/2023   K 5.1 11/25/2023   CL 103 11/25/2023   CO2 24 11/25/2023   GLUCOSE 82 11/25/2023   BUN 19 11/25/2023   CREATININE 0.68 11/25/2023   BILITOT 0.6 11/25/2023   ALKPHOS 88 11/25/2023   AST 23 11/25/2023   ALT 19 11/25/2023   PROT 6.5 11/25/2023   ALBUMIN 4.4 11/25/2023   CALCIUM  9.5 11/25/2023   GFRAA 109 04/16/2021    Speciality Comments: No specialty comments available.  Procedures:  No procedures performed Allergies: E-mycin [erythromycin] and Sulfa antibiotics   Assessment / Plan:     Visit Diagnoses: Raynaud's phenomenon without gangrene -  History of severe Raynauds since in her 30s.  Autoimmune workup negative May 01, 2021.  She denies any history of oral ulcers, nasal ulcers, malar rash, inflammatory arthritis or lymphadenopathy.  No synovitis was noted.  She had decreased capillary refill without nailbed capillary changes.  No sclerodactyly was noted.  I advised her to contact me if she develops any new symptoms.  Primary osteoarthritis of both hands-no significant PIP and DIP thickening was noted.  No synovitis was noted on the examination.  She continues to have some stiffness.  S/P ORIF (open reduction internal fixation) fracture -she has limited range of motion of her left wrist joint.  No synovitis was noted.  Surgical scar was noted.  Left wrist July 2021.  She did well after PT and OT.  Trochanteric bursitis, left hip-she had mild tenderness over the left trochanteric region.  IT band stretches were emphasized.  Osteopenia of multiple sites - 06/07/23The BMD measured at [facility] L1-L3 is 0.994 g/cm2 with a T-score of -1.5.  She has been taking calcium  and vitamin D .  She will have repeat DEXA scan through her PCP.  Other medical problems are listed as follows:  History of anxiety  Stress incontinence in female  OSA (obstructive sleep apnea) - dxd by Dr. Karn Pac.  She is using   BMI 36.39,adult -patient reports recent weight gain.  She has been working on weight loss with diet and exercise.  Orders: No orders of the defined types were placed in this encounter.  No orders of the defined types were placed in this encounter.    Follow-Up Instructions: Return in about 1 year (around 03/16/2025) for Osteoarthritis.   Nicholas Bari, MD  Note - This record has been created using Animal nutritionist.  Chart creation errors have been sought, but may not always  have been located. Such creation errors do not reflect on  the standard of medical care.

## 2024-03-05 DIAGNOSIS — J3089 Other allergic rhinitis: Secondary | ICD-10-CM | POA: Diagnosis not present

## 2024-03-09 DIAGNOSIS — R635 Abnormal weight gain: Secondary | ICD-10-CM | POA: Diagnosis not present

## 2024-03-09 DIAGNOSIS — I7300 Raynaud's syndrome without gangrene: Secondary | ICD-10-CM | POA: Diagnosis not present

## 2024-03-09 DIAGNOSIS — Z1331 Encounter for screening for depression: Secondary | ICD-10-CM | POA: Diagnosis not present

## 2024-03-09 DIAGNOSIS — E559 Vitamin D deficiency, unspecified: Secondary | ICD-10-CM | POA: Diagnosis not present

## 2024-03-09 DIAGNOSIS — F419 Anxiety disorder, unspecified: Secondary | ICD-10-CM | POA: Diagnosis not present

## 2024-03-09 DIAGNOSIS — R7309 Other abnormal glucose: Secondary | ICD-10-CM | POA: Diagnosis not present

## 2024-03-09 DIAGNOSIS — Z1339 Encounter for screening examination for other mental health and behavioral disorders: Secondary | ICD-10-CM | POA: Diagnosis not present

## 2024-03-09 DIAGNOSIS — R454 Irritability and anger: Secondary | ICD-10-CM | POA: Diagnosis not present

## 2024-03-09 DIAGNOSIS — E785 Hyperlipidemia, unspecified: Secondary | ICD-10-CM | POA: Diagnosis not present

## 2024-03-09 DIAGNOSIS — N951 Menopausal and female climacteric states: Secondary | ICD-10-CM | POA: Diagnosis not present

## 2024-03-16 ENCOUNTER — Encounter: Payer: Self-pay | Admitting: Rheumatology

## 2024-03-16 ENCOUNTER — Ambulatory Visit: Payer: Commercial Managed Care - PPO | Attending: Rheumatology | Admitting: Rheumatology

## 2024-03-16 VITALS — BP 110/75 | HR 81 | Resp 14 | Ht 64.0 in | Wt 212.0 lb

## 2024-03-16 DIAGNOSIS — M19042 Primary osteoarthritis, left hand: Secondary | ICD-10-CM

## 2024-03-16 DIAGNOSIS — Z8659 Personal history of other mental and behavioral disorders: Secondary | ICD-10-CM | POA: Diagnosis not present

## 2024-03-16 DIAGNOSIS — I7300 Raynaud's syndrome without gangrene: Secondary | ICD-10-CM

## 2024-03-16 DIAGNOSIS — Z6834 Body mass index (BMI) 34.0-34.9, adult: Secondary | ICD-10-CM

## 2024-03-16 DIAGNOSIS — Z6836 Body mass index (BMI) 36.0-36.9, adult: Secondary | ICD-10-CM

## 2024-03-16 DIAGNOSIS — E611 Iron deficiency: Secondary | ICD-10-CM | POA: Diagnosis not present

## 2024-03-16 DIAGNOSIS — M7062 Trochanteric bursitis, left hip: Secondary | ICD-10-CM

## 2024-03-16 DIAGNOSIS — M19041 Primary osteoarthritis, right hand: Secondary | ICD-10-CM

## 2024-03-16 DIAGNOSIS — Z8781 Personal history of (healed) traumatic fracture: Secondary | ICD-10-CM

## 2024-03-16 DIAGNOSIS — M8589 Other specified disorders of bone density and structure, multiple sites: Secondary | ICD-10-CM | POA: Diagnosis not present

## 2024-03-16 DIAGNOSIS — Z9889 Other specified postprocedural states: Secondary | ICD-10-CM | POA: Diagnosis not present

## 2024-03-16 DIAGNOSIS — N393 Stress incontinence (female) (male): Secondary | ICD-10-CM | POA: Diagnosis not present

## 2024-03-16 DIAGNOSIS — G4733 Obstructive sleep apnea (adult) (pediatric): Secondary | ICD-10-CM

## 2024-03-23 DIAGNOSIS — E785 Hyperlipidemia, unspecified: Secondary | ICD-10-CM | POA: Diagnosis not present

## 2024-03-23 DIAGNOSIS — Z6836 Body mass index (BMI) 36.0-36.9, adult: Secondary | ICD-10-CM | POA: Diagnosis not present

## 2024-03-24 DIAGNOSIS — J3089 Other allergic rhinitis: Secondary | ICD-10-CM | POA: Diagnosis not present

## 2024-03-30 DIAGNOSIS — Z6836 Body mass index (BMI) 36.0-36.9, adult: Secondary | ICD-10-CM | POA: Diagnosis not present

## 2024-03-30 DIAGNOSIS — E611 Iron deficiency: Secondary | ICD-10-CM | POA: Diagnosis not present

## 2024-03-30 DIAGNOSIS — E559 Vitamin D deficiency, unspecified: Secondary | ICD-10-CM | POA: Diagnosis not present

## 2024-04-06 ENCOUNTER — Telehealth: Admitting: Family Medicine

## 2024-04-06 ENCOUNTER — Other Ambulatory Visit (HOSPITAL_COMMUNITY): Payer: Self-pay

## 2024-04-06 DIAGNOSIS — R21 Rash and other nonspecific skin eruption: Secondary | ICD-10-CM | POA: Diagnosis not present

## 2024-04-06 DIAGNOSIS — K219 Gastro-esophageal reflux disease without esophagitis: Secondary | ICD-10-CM | POA: Diagnosis not present

## 2024-04-06 DIAGNOSIS — Z6836 Body mass index (BMI) 36.0-36.9, adult: Secondary | ICD-10-CM | POA: Diagnosis not present

## 2024-04-06 MED ORDER — MUPIROCIN 2 % EX OINT
1.0000 | TOPICAL_OINTMENT | Freq: Two times a day (BID) | CUTANEOUS | 0 refills | Status: AC
  Filled 2024-04-06: qty 22, 11d supply, fill #0

## 2024-04-06 NOTE — Progress Notes (Signed)
 E Visit for Rash  We are sorry that you are not feeling well. Here is how we plan to help!  Topical mupiricin to help cover for impetigo.  Also given it might be a seborrheic dermatitis we also recommend OTC Ketoconazole shampoo to apply to the area in the shower.  HOME CARE:  Take cool showers and avoid direct sunlight. Apply cool compress or wet dressings. Take a bath in an oatmeal bath.  Sprinkle content of one Aveeno packet under running faucet with comfortably warm water.  Bathe for 15-20 minutes, 1-2 times daily.  Pat dry with a towel. Do not rub the rash. Use hydrocortisone  cream. Take an antihistamine like Benadryl for widespread rashes that itch.  The adult dose of Benadryl is 25-50 mg by mouth 4 times daily. Caution:  This type of medication may cause sleepiness.  Do not drink alcohol, drive, or operate dangerous machinery while taking antihistamines.  Do not take these medications if you have prostate enlargement.  Read package instructions thoroughly on all medications that you take.  GET HELP RIGHT AWAY IF:  Symptoms don't go away after treatment. Severe itching that persists. If you rash spreads or swells. If you rash begins to smell. If it blisters and opens or develops a yellow-brown crust. You develop a fever. You have a sore throat. You become short of breath.  MAKE SURE YOU:  Understand these instructions. Will watch your condition. Will get help right away if you are not doing well or get worse.  Thank you for choosing an e-visit.  Your e-visit answers were reviewed by a board certified advanced clinical practitioner to complete your personal care plan. Depending upon the condition, your plan could have included both over the counter or prescription medications.  Please review your pharmacy choice. Make sure the pharmacy is open so you can pick up prescription now. If there is a problem, you may contact your provider through [employer] and have the  prescription routed to another pharmacy.  Your safety is important to us . If you have drug allergies check your prescription carefully.   For the next 24 hours you can use MyChart to ask questions about today's visit, request a non-urgent call back, or ask for a work or school excuse. You will get an email in the next two days asking about your experience. I hope that your e-visit has been valuable and will speed your recovery.  I provided 5 minutes of non face-to-face time during this encounter for chart review, medication and order placement, as well as and documentation.

## 2024-04-13 DIAGNOSIS — Z6836 Body mass index (BMI) 36.0-36.9, adult: Secondary | ICD-10-CM | POA: Diagnosis not present

## 2024-04-13 DIAGNOSIS — E785 Hyperlipidemia, unspecified: Secondary | ICD-10-CM | POA: Diagnosis not present

## 2024-04-20 DIAGNOSIS — Z6836 Body mass index (BMI) 36.0-36.9, adult: Secondary | ICD-10-CM | POA: Diagnosis not present

## 2024-04-20 DIAGNOSIS — G479 Sleep disorder, unspecified: Secondary | ICD-10-CM | POA: Diagnosis not present

## 2024-04-26 NOTE — Progress Notes (Signed)
 Victoria Herman

## 2024-04-27 ENCOUNTER — Ambulatory Visit (INDEPENDENT_AMBULATORY_CARE_PROVIDER_SITE_OTHER): Payer: Commercial Managed Care - PPO | Admitting: Family Medicine

## 2024-04-27 ENCOUNTER — Encounter: Payer: Self-pay | Admitting: Family Medicine

## 2024-04-27 ENCOUNTER — Other Ambulatory Visit (HOSPITAL_COMMUNITY): Payer: Self-pay

## 2024-04-27 VITALS — BP 120/79 | HR 66 | Ht 63.0 in | Wt 210.0 lb

## 2024-04-27 DIAGNOSIS — Z6836 Body mass index (BMI) 36.0-36.9, adult: Secondary | ICD-10-CM | POA: Insufficient documentation

## 2024-04-27 DIAGNOSIS — Z6835 Body mass index (BMI) 35.0-35.9, adult: Secondary | ICD-10-CM | POA: Diagnosis not present

## 2024-04-27 DIAGNOSIS — M2550 Pain in unspecified joint: Secondary | ICD-10-CM | POA: Diagnosis not present

## 2024-04-27 DIAGNOSIS — K219 Gastro-esophageal reflux disease without esophagitis: Secondary | ICD-10-CM | POA: Diagnosis not present

## 2024-04-27 DIAGNOSIS — E66812 Obesity, class 2: Secondary | ICD-10-CM | POA: Insufficient documentation

## 2024-04-27 DIAGNOSIS — Z6837 Body mass index (BMI) 37.0-37.9, adult: Secondary | ICD-10-CM | POA: Insufficient documentation

## 2024-04-27 DIAGNOSIS — R7303 Prediabetes: Secondary | ICD-10-CM | POA: Insufficient documentation

## 2024-04-27 DIAGNOSIS — L710 Perioral dermatitis: Secondary | ICD-10-CM | POA: Diagnosis not present

## 2024-04-27 DIAGNOSIS — I7300 Raynaud's syndrome without gangrene: Secondary | ICD-10-CM | POA: Insufficient documentation

## 2024-04-27 DIAGNOSIS — G4733 Obstructive sleep apnea (adult) (pediatric): Secondary | ICD-10-CM | POA: Diagnosis not present

## 2024-04-27 DIAGNOSIS — R454 Irritability and anger: Secondary | ICD-10-CM | POA: Insufficient documentation

## 2024-04-27 MED ORDER — METRONIDAZOLE 0.75 % EX CREA
TOPICAL_CREAM | CUTANEOUS | 3 refills | Status: AC
  Filled 2024-04-27: qty 45, 30d supply, fill #0

## 2024-04-27 NOTE — Patient Instructions (Signed)

## 2024-04-27 NOTE — Progress Notes (Signed)
 PATIENT: Victoria Herman DOB: [birth year]  REASON FOR VISIT: follow up HISTORY FROM: patient  Chief Complaint  Patient presents with   RM1/CPAP    Pt is here Alone. Pt states that things have been going fine. Pt states that she has been getting rashes under her nose with her CPAP.      HISTORY OF PRESENT ILLNESS:  04/27/24 ALL:  Victoria Herman is a 63 y.o. female here today for follow up for OSA on CPAP. She reports doing well until around May 2025 when she developed a pruritic rash around her nose. She uses a nasal pillow and felt it was from the mask. She has used mupirocin  and hydrocortisone  with no improvement. She stopped therapy a few days ago and rash may be a little better. She is seeing dermatology, today. No concerns with machine or supplies. She does rest well when using therapy.      HISTORY: (copied from Dr Dohmeier's previous note)  Victoria Herman is a 63 y.o. female patient who is here for revisit 04/29/2023 for  new CPAP - The machine , cable and mask were not brought to this visit.  There is thankfully no need to trouble shoot. 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  Her average user time at night is 5 hours 40 minutes, she has been a 77% compliant patient -meaning that she use the machine 23 out of 30 days.  I not sure if this is actually valid observation because I can look back to May 14 6 when the patient was issued with a new machine and over that.  Of time she has definitely have a higher compliance than 77%.  She uses the machine she has an air leak of 26 L a minute and she has an  average AHI or apnea hypopnea index of 1.9 events per hour for this is a significant reduction from over 30 apneas and hypopneas to less than 2/h.  She does not feel that it has affected sleepiness or fatigue but when she presented initially I was surprised how little sleepiness and fatigue she had reported.  So I hope that her husband is happy that her snoring will be treated this way . Epworth Sleepiness Scale was endorsed at 2 points out of 24 and her fatigue severity was endorsed at 11 out of 63 and the geriatric depression scale was endorsed at 1 out of 15 points.     12-19-2022:   Patient of Dr Jarold' seen for a sleep consultation..  Chief concern according to patient :   I grind my teeth, I clench my jaw. I work 40 hours a week  and then I take care of my autistic 35 year-old sister.    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    2) risk factors for OSA - are high grade [place]ompatti, smaller crowded dentition, overbite.    3) BMI is elevated, but she lost 33 pounds and is fighting not to regain. Husband reported her snoring got worse.      Family medical /sleep history: Oldest Brother on CPAP with OSA, brother with hypersomnia. 2 siblings with essential tremor, father was affected. Sister with autism, functioning.   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  No pets, The patient  used to work in shifts( night/ rotating,) over a decade ago . She recently had a colonoscopy and was not told she had apnea.    REVIEW OF SYSTEMS: Out of a complete 14 system review of symptoms, the patient complains only of the following symptoms, rash around nose, and all other reviewed systems are negative.  ESS:  1/24  ALLERGIES: Allergies  Allergen Reactions   E-Mycin [Erythromycin] Diarrhea   Sulfa Antibiotics Rash and Dermatitis    HOME MEDICATIONS: Outpatient Medications Prior to Visit  Medication Sig Dispense Refill   atorvastatin  (LIPITOR) 20 MG tablet Take 1 tablet (20 mg total) by mouth daily Monday through Saturday, skip Sunday 90 tablet 2   Barberry-Oreg Grape-Goldenseal (BERBERINE COMPLEX PO) Take 2 tablets by mouth daily.     Calcium  Carb-Cholecalciferol (CALCIUM  600 + D PO) Take 1 tablet by mouth daily. Vit D 12.5     CALCIUM  PO Take by mouth.     cetirizine (ZYRTEC) 10 MG tablet Take 10 mg by mouth daily.     Cholecalciferol (VITAMIN D3 SUPER STRENGTH) 50 MCG (2000 UT) CAPS Take 2,000 Units by mouth daily.     citalopram  (CELEXA ) 20 MG tablet Take 1 tablet (20 mg total) by mouth daily. 90 tablet 2   mometasone  (NASONEX ) 50 MCG/ACT nasal spray Use 2 sprays in each nostril once daily 17 g 5   mupirocin  ointment (BACTROBAN ) 2 % Apply topically 2 (two) times daily. 22 g 0   amLODipine  (NORVASC ) 5 MG tablet Take 1 tablet (5 mg total) by mouth daily. (Patient not taking: Reported on 04/27/2024) 30 tablet 2   EPINEPHrine  (EPIPEN  2-PAK) 0.3 mg/0.3 mL IJ SOAJ injection Inject 0.3 mg into the muscle as needed for systemic reaction (Patient not taking: Reported on 04/27/2024) 2 each 1   hydrocortisone  2.5 % cream Apply topically 2 (two) times daily. (Patient not taking: Reported on 04/27/2024) 30 g 0   No facility-administered medications prior to visit.    PAST MEDICAL HISTORY: Past Medical History:  Diagnosis Date   Allergy    Anxiety    Constipation    GERD (gastroesophageal reflux disease)    Hyperlipidemia    Osteoarthritis    Seasonal allergies    Sleep apnea    Superficial thrombophlebitis 03-2014, 07-2014   Swallowing difficulty    Vitamin D  deficiency     PAST SURGICAL HISTORY: Past Surgical History:  Procedure Laterality Date   BROW LIFT  03/11/2023   COLONOSCOPY      ORIF WRIST FRACTURE Left 04/12/2020   Procedure: OPEN REDUCTION INTERNAL FIXATION (  ORIF) WRIST FRACTURE and repair reconstruction as indicated;  Surgeon: Shari Easter, MD;  Location: [facility] OR;  Service: Orthopedics;  Laterality: Left;  with IV sedation  Needs 2 hours   WISDOM TOOTH EXTRACTION      FAMILY HISTORY: Family History  Problem Relation Age of Onset   Hypertension Mother    Diabetes Mother    Varicose Veins Mother    Hyperlipidemia Mother    Cancer Mother    Varicose Veins Father    Cancer Father    Hypertension Sister    Diabetes Sister    Thyroid  disease Sister    Varicose Veins Brother    Hypertension Brother    Hypertension Brother    Varicose Veins Brother    Healthy Daughter    Migraines Daughter    Colon cancer Neg Hx    Esophageal cancer Neg Hx    Rectal cancer Neg Hx    Stomach cancer Neg Hx    Breast cancer Neg Hx     SOCIAL HISTORY: Social History   Socioeconomic History   Marital status: Married    Spouse name: Not on file   Number of children: Not on file   Years of education: Not on file   Highest education level: Bachelor's degree (e.g., BA, AB, BS)  Occupational History   Not on file  Tobacco Use   Smoking status: Never    Passive exposure: Past   Smokeless tobacco: Never  Vaping Use   Vaping status: Never Used  Substance and Sexual Activity   Alcohol use: Yes    Comment: occas.   Drug use: Never   Sexual activity: Not on file  Other Topics Concern   Not on file  Social History Narrative   Not on file   Social Drivers of Health   Financial Resource Strain: Low Risk  (11/25/2023)   Overall Financial Resource Strain (CARDIA)    Difficulty of Paying Living Expenses: Not hard at all  Food Insecurity: No Food Insecurity (11/25/2023)   Hunger Vital Sign    Worried About Running Out of Food in the Last Year: Never true    Ran Out of Food in the Last Year: Never true  Transportation Needs: No Transportation Needs (11/25/2023)   PRAPARE  - Administrator, Civil Service (Medical): No    Lack of Transportation (Non-Medical): No  Physical Activity: Inactive (11/25/2023)   Exercise Vital Sign    Days of Exercise per Week: 0 days    Minutes of Exercise per Session: 30 min  Stress: Stress Concern Present (11/25/2023)   [employer] of Occupational Health - Occupational Stress Questionnaire    Feeling of Stress : Rather much  Social Connections: Socially Integrated (11/25/2023)   Social Connection and Isolation Panel    Frequency of Communication with Friends and Family: More than three times a week    Frequency of Social Gatherings with Friends and Family: More than three times a week    Attends Religious Services: More than 4 times per year    Active Member of [employer] or Organizations: Yes    Attends Engineer, structural: More than 4 times per year    Marital Status: Married  Catering manager Violence: Not on file     PHYSICAL EXAM  Vitals:   04/27/24 1356  BP: 120/79  Pulse: 66  SpO2: 98%  Weight: 210 lb (95.3 kg)  Height: 5' 3 (1.6 m)   Body mass index is 37.2 kg/m.  Generalized:  Well developed, in no acute distress  Cardiology: normal rate and rhythm, no murmur noted Respiratory: clear to auscultation bilaterally  Neurological examination  Mentation: Alert oriented to time, place, history taking. Follows all commands speech and language fluent Cranial nerve II-XII: Pupils were equal round reactive to light. Extraocular movements were full, visual field were full  Motor: The motor testing reveals 5 over 5 strength of all 4 extremities. Good symmetric motor tone is noted throughout.  Gait and station: Gait is normal.    DIAGNOSTIC DATA (LABS, IMAGING, TESTING) - I reviewed patient records, labs, notes, testing and imaging myself where available.      No data to display           Lab Results  Component Value Date   WBC 6.1 05/21/2023   HGB 14.0 05/21/2023   HCT 41.6  05/21/2023   MCV 89 05/21/2023   PLT 256 05/21/2023      Component Value Date/Time   NA 141 11/25/2023 1532   K 5.1 11/25/2023 1532   CL 103 11/25/2023 1532   CO2 24 11/25/2023 1532   GLUCOSE 82 11/25/2023 1532   BUN 19 11/25/2023 1532   CREATININE 0.68 11/25/2023 1532   CALCIUM  9.5 11/25/2023 1532   PROT 6.5 11/25/2023 1532   ALBUMIN 4.4 11/25/2023 1532   AST 23 11/25/2023 1532   ALT 19 11/25/2023 1532   ALKPHOS 88 11/25/2023 1532   BILITOT 0.6 11/25/2023 1532   GFRNONAA 90 04/16/2021 0000   GFRAA 109 04/16/2021 0000   Lab Results  Component Value Date   CHOL 160 05/21/2023   HDL 77 05/21/2023   LDLCALC 69 05/21/2023   TRIG 76 05/21/2023   CHOLHDL 2.1 05/21/2023   Lab Results  Component Value Date   HGBA1C 5.8 (H) 11/25/2023   Lab Results  Component Value Date   VITAMINB12 485 02/20/2023   Lab Results  Component Value Date   TSH 2.160 11/25/2023     ASSESSMENT AND PLAN 63 y.o. year old female  has a past medical history of Allergy, Anxiety, Constipation, GERD (gastroesophageal reflux disease), Hyperlipidemia, Osteoarthritis, Seasonal allergies, Sleep apnea, Superficial thrombophlebitis (03-2014, 07-2014), Swallowing difficulty, and Vitamin D  deficiency. here with     ICD-10-CM   1. OSA on CPAP  G47.33 For home use only DME continuous positive airway pressure (CPAP)       Relena A Demeritt is doing well on CPAP therapy but has developed a pruritic rash around the base of her nares. She has follow up with dermatology, today. Compliance report reveals sub optimal use for the past 60 days but was doing well prior. She was encouraged to continue using CPAP nightly and for greater than 4 hours each night. We will update supply orders as indicated. Risks of untreated sleep apnea review and education materials provided. I will reprint download in 8 weeks to review compliance. Healthy lifestyle habits encouraged. She will follow up in 1 year, sooner if needed. She verbalizes  understanding and agreement with this plan.    Orders Placed This Encounter  Procedures   For home use only DME continuous positive airway pressure (CPAP)    Heated Humidity with all supplies as needed    Length of Need:   Lifetime    Patient has OSA or probable OSA:   Yes    Is the patient currently using CPAP in the home:   Yes    Settings:   Other see comments    CPAP supplies needed:  Mask, headgear, cushions, filters, heated tubing and water chamber     No orders of the defined types were placed in this encounter.     Greig Forbes, FNP-C 04/27/2024, 2:21 PM [facility] [address], [address] [place], [place] [address] [phone]

## 2024-04-28 ENCOUNTER — Encounter: Payer: Self-pay | Admitting: Family Medicine

## 2024-04-28 NOTE — Telephone Encounter (Signed)
 Just FYI.

## 2024-05-11 DIAGNOSIS — Z6835 Body mass index (BMI) 35.0-35.9, adult: Secondary | ICD-10-CM | POA: Diagnosis not present

## 2024-05-11 DIAGNOSIS — K219 Gastro-esophageal reflux disease without esophagitis: Secondary | ICD-10-CM | POA: Diagnosis not present

## 2024-05-23 ENCOUNTER — Other Ambulatory Visit: Payer: Self-pay | Admitting: Internal Medicine

## 2024-05-24 ENCOUNTER — Other Ambulatory Visit (HOSPITAL_COMMUNITY): Payer: Self-pay

## 2024-05-24 ENCOUNTER — Other Ambulatory Visit: Payer: Self-pay

## 2024-05-24 MED ORDER — CITALOPRAM HYDROBROMIDE 20 MG PO TABS
20.0000 mg | ORAL_TABLET | Freq: Every day | ORAL | 2 refills | Status: AC
  Filled 2024-05-24: qty 90, 90d supply, fill #0
  Filled 2024-08-23: qty 90, 90d supply, fill #1

## 2024-05-25 ENCOUNTER — Encounter: Payer: Self-pay | Admitting: Internal Medicine

## 2024-05-25 ENCOUNTER — Ambulatory Visit: Payer: Self-pay | Admitting: Internal Medicine

## 2024-05-25 ENCOUNTER — Other Ambulatory Visit (HOSPITAL_COMMUNITY): Payer: Self-pay

## 2024-05-25 VITALS — BP 108/74 | HR 69 | Temp 98.2°F | Ht 63.0 in | Wt 205.7 lb

## 2024-05-25 DIAGNOSIS — G4733 Obstructive sleep apnea (adult) (pediatric): Secondary | ICD-10-CM | POA: Diagnosis not present

## 2024-05-25 DIAGNOSIS — E66812 Obesity, class 2: Secondary | ICD-10-CM | POA: Diagnosis not present

## 2024-05-25 DIAGNOSIS — K644 Residual hemorrhoidal skin tags: Secondary | ICD-10-CM

## 2024-05-25 DIAGNOSIS — I7300 Raynaud's syndrome without gangrene: Secondary | ICD-10-CM | POA: Diagnosis not present

## 2024-05-25 DIAGNOSIS — E7800 Pure hypercholesterolemia, unspecified: Secondary | ICD-10-CM

## 2024-05-25 DIAGNOSIS — Z6836 Body mass index (BMI) 36.0-36.9, adult: Secondary | ICD-10-CM

## 2024-05-25 DIAGNOSIS — Z0000 Encounter for general adult medical examination without abnormal findings: Secondary | ICD-10-CM | POA: Diagnosis not present

## 2024-05-25 DIAGNOSIS — R7303 Prediabetes: Secondary | ICD-10-CM | POA: Diagnosis not present

## 2024-05-25 DIAGNOSIS — M8580 Other specified disorders of bone density and structure, unspecified site: Secondary | ICD-10-CM

## 2024-05-25 MED ORDER — ATORVASTATIN CALCIUM 20 MG PO TABS
20.0000 mg | ORAL_TABLET | Freq: Every day | ORAL | 2 refills | Status: AC
  Filled 2024-05-25 – 2024-08-02 (×2): qty 90, 90d supply, fill #0
  Filled 2024-10-26: qty 90, 90d supply, fill #1

## 2024-05-25 MED ORDER — HYDROCORTISONE 2.5 % EX CREA
TOPICAL_CREAM | Freq: Two times a day (BID) | CUTANEOUS | 0 refills | Status: AC
  Filled 2024-05-25: qty 30, 10d supply, fill #0
  Filled 2024-06-02: qty 30, 15d supply, fill #0

## 2024-05-25 MED ORDER — EPINEPHRINE 0.3 MG/0.3ML IJ SOAJ
0.3000 mg | INTRAMUSCULAR | 1 refills | Status: AC | PRN
  Filled 2024-05-25 – 2024-06-02 (×2): qty 2, 1d supply, fill #0

## 2024-05-25 NOTE — Assessment & Plan Note (Signed)

## 2024-05-25 NOTE — Progress Notes (Signed)
 I,Victoria Herman, CMA,acting as a Neurosurgeon for Victoria LOISE Slocumb, MD.,have documented all relevant documentation on the behalf of Victoria LOISE Slocumb, MD,as directed by  Victoria LOISE Slocumb, MD while in the presence of Victoria LOISE Slocumb, MD.  Subjective:    Patient ID: Victoria Herman , female    DOB: [birth year] , 63 y.o.   MRN: [id number]  Chief Complaint  Patient presents with   Annual Exam    Patient presents today for annual exam. She reports compliance with medications. Denies headache, chest pain & sob.    Hyperlipidemia    HPI Discussed the use of AI scribe software for clinical note transcription with the patient, who gave verbal consent to proceed.  History of Present Illness Victoria Herman is a 63 year old female who presents for her annual physical.  Her last tetanus shot was in 2019, and she has completed her shingles vaccination. She is up to date with her Pap smear, last done in 2023.  She is currently taking atorvastatin  20 mg for cholesterol management, with no refills left, and citalopram  20 mg, which was recently reordered. She also uses an EpiPen , which expired in June, and hydrocortisone  cream as needed for hemorrhoids.  She is undergoing GLP-1 injections with compounded semaglutide at [facility], receiving weekly shots administered by the clinic. She is also taking stool softeners.  She exercises approximately three times a week and is managing her Raynaud's with amlodipine , which she plans to resume in the winter.  Her last bone density scan was in June 2023.   Past Medical History:  Diagnosis Date   Allergy    Anxiety    Constipation    GERD (gastroesophageal reflux disease)    Hyperlipidemia    Osteoarthritis    Seasonal allergies    Sleep apnea    Superficial thrombophlebitis 03-2014, 07-2014   Swallowing difficulty    Vitamin D  deficiency      Family History  Problem Relation Age of Onset   Hypertension Mother    Diabetes Mother    Varicose Veins  Mother    Hyperlipidemia Mother    Cancer Mother    Varicose Veins Father    Cancer Father    Hypertension Sister    Diabetes Sister    Thyroid  disease Sister    Varicose Veins Brother    Hypertension Brother    Hypertension Brother    Varicose Veins Brother    Healthy Daughter    Migraines Daughter    Colon cancer Neg Hx    Esophageal cancer Neg Hx    Rectal cancer Neg Hx    Stomach cancer Neg Hx    Breast cancer Neg Hx      Current Outpatient Medications:    Barberry-Oreg Grape-Goldenseal (BERBERINE COMPLEX PO), Take 2 tablets by mouth daily., Disp: , Rfl:    Calcium  Carb-Cholecalciferol (CALCIUM  600 + D PO), Take 1 tablet by mouth daily. Vit D 12.5, Disp: , Rfl:    CALCIUM  PO, Take by mouth., Disp: , Rfl:    cetirizine (ZYRTEC) 10 MG tablet, Take 10 mg by mouth daily., Disp: , Rfl:    Cholecalciferol (VITAMIN D3 SUPER STRENGTH) 50 MCG (2000 UT) CAPS, Take 2,000 Units by mouth daily., Disp: , Rfl:    citalopram  (CELEXA ) 20 MG tablet, Take 1 tablet (20 mg total) by mouth daily., Disp: 90 tablet, Rfl: 2   metroNIDAZOLE  (METROCREAM ) 0.75 % cream, Apply dime sized amount to face twice a day as directed,  taper dose to once daily once improved, Disp: 45 g, Rfl: 3   mometasone  (NASONEX ) 50 MCG/ACT nasal spray, Use 2 sprays in each nostril once daily, Disp: 17 g, Rfl: 5   amLODipine  (NORVASC ) 5 MG tablet, Take 1 tablet (5 mg total) by mouth daily. (Patient not taking: Reported on 05/25/2024), Disp: 30 tablet, Rfl: 2   atorvastatin  (LIPITOR) 20 MG tablet, Take 1 tablet (20 mg total) by mouth daily Monday through Saturday, skip Sunday, Disp: 90 tablet, Rfl: 2   EPINEPHrine  (EPIPEN  2-PAK) 0.3 mg/0.3 mL IJ SOAJ injection, Inject 0.3 mg into the muscle as needed for systemic reaction, Disp: 2 each, Rfl: 1   hydrocortisone  2.5 % cream, Apply topically 2 (two) times daily., Disp: 30 g, Rfl: 0   mupirocin  ointment (BACTROBAN ) 2 %, Apply topically 2 (two) times daily. (Patient not taking: Reported  on 05/25/2024), Disp: 22 g, Rfl: 0   Allergies  Allergen Reactions   E-Mycin [Erythromycin] Diarrhea   Sulfa Antibiotics Rash and Dermatitis      The patient states she uses post menopausal status for birth control. Patient's last menstrual period was 07/23/2013.. Negative for Dysmenorrhea. Negative for: breast discharge, breast lump(s), breast pain and breast self exam. Associated symptoms include abnormal vaginal bleeding. Pertinent negatives include abnormal bleeding (hematology), anxiety, decreased libido, depression, difficulty falling sleep, dyspareunia, history of infertility, nocturia, sexual dysfunction, sleep disturbances, urinary incontinence, urinary urgency, vaginal discharge and vaginal itching. Diet regular.The patient states her exercise level is  intermittent.  . The patient's tobacco use is:  Social History   Tobacco Use  Smoking Status Never   Passive exposure: Past  Smokeless Tobacco Never  . She has been exposed to passive smoke. The patient's alcohol use is:  Social History   Substance and Sexual Activity  Alcohol Use Yes   Comment: occas.    Review of Systems  Constitutional: Negative.   HENT: Negative.    Eyes: Negative.   Respiratory: Negative.    Cardiovascular: Negative.   Gastrointestinal: Negative.   Endocrine: Negative.   Genitourinary: Negative.   Musculoskeletal: Negative.   Skin: Negative.   Allergic/Immunologic: Negative.   Neurological: Negative.   Hematological: Negative.   Psychiatric/Behavioral: Negative.       Today's Vitals   05/25/24 1405  BP: 108/74  Pulse: 69  Temp: 98.2 F (36.8 C)  SpO2: 98%  Weight: 205 lb 11.2 oz (93.3 kg)  Height: 5' 3 (1.6 m)   Body mass index is 36.44 kg/m.  Wt Readings from Last 3 Encounters:  05/25/24 205 lb 11.2 oz (93.3 kg)  04/27/24 210 lb (95.3 kg)  03/16/24 212 lb (96.2 kg)     Objective:  Physical Exam Vitals and nursing note reviewed. Exam conducted with a chaperone present.   Constitutional:      Appearance: Normal appearance. She is obese.  HENT:     Head: Normocephalic and atraumatic.     Right Ear: Tympanic membrane, ear canal and external ear normal.     Left Ear: Tympanic membrane, ear canal and external ear normal.     Nose: Nose normal.     Mouth/Throat:     Mouth: Mucous membranes are moist.     Pharynx: Oropharynx is clear.  Eyes:     Extraocular Movements: Extraocular movements intact.     Conjunctiva/sclera: Conjunctivae normal.     Pupils: Pupils are equal, round, and reactive to light.  Cardiovascular:     Rate and Rhythm: Normal rate and regular rhythm.  Pulses: Normal pulses.     Heart sounds: Normal heart sounds.     Comments: Varicose veins b/l LE Pulmonary:     Effort: Pulmonary effort is normal.     Breath sounds: Normal breath sounds.  Chest:  Breasts:    Tanner Score is 5.     Right: Normal.     Left: Normal.  Abdominal:     General: Bowel sounds are normal.     Palpations: Abdomen is soft.  Genitourinary:    Exam position: Lithotomy position.     Tanner stage (genital): 5.     Vagina: Normal.     Cervix: Normal.     Uterus: Normal.      Comments: deferred Musculoskeletal:        General: Normal range of motion.     Cervical back: Normal range of motion and neck supple.  Lymphadenopathy:     Lower Body: No right inguinal adenopathy. No left inguinal adenopathy.  Skin:    General: Skin is warm and dry.  Neurological:     General: No focal deficit present.     Mental Status: She is alert and oriented to person, place, and time.  Psychiatric:        Mood and Affect: Mood normal.        Behavior: Behavior normal.         Assessment And Plan:     Routine general medical examination at health care facility Assessment & Plan: A full exam was performed.  Importance of monthly self breast exams was discussed with the patient.  She is advised to get 30-45 minutes of regular exercise, no less than four to five days  per week. Both weight-bearing and aerobic exercises are recommended.  She is advised to follow a healthy diet with at least six fruits/veggies per day, decrease intake of red meat and other saturated fats and to increase fish intake to twice weekly.  Meats/fish should not be fried -- baked, boiled or broiled is preferable. It is also important to cut back on your sugar intake.  Be sure to read labels - try to avoid anything with added sugar, high fructose corn syrup or other sweeteners.  If you must use a sweetener, you can try stevia or monkfruit.  It is also important to avoid artificially sweetened foods/beverages and diet drinks. Lastly, wear SPF 50 sunscreen on exposed skin and when in direct sunlight for an extended period of time.  Be sure to avoid fast food restaurants and aim for at least 60 ounces of water daily.      Orders: -     CBC -     CMP14+EGFR -     Lipid panel  Pure hypercholesterolemia Assessment & Plan: Chronic, she will continue with atorvastatin  20mg  daily.  She is encouraged to follow a heart healthy lifestyle.   Orders: -     Atorvastatin  Calcium ; Take 1 tablet (20 mg total) by mouth daily Monday through Saturday, skip Sunday  Dispense: 90 tablet; Refill: 2  Prediabetes Assessment & Plan: Previous labs reviewed, her A1c has been elevated in the past. I will check an A1c today. Reminded to avoid refined sugars including sugary drinks/foods and processed meats including bacon, sausages and deli meats.    Orders: -     Hemoglobin A1c  Raynaud's phenomenon without gangrene Assessment & Plan: Raynaud's phenomenon managed as needed during winter months. - Resume amlodipine  when needed   OSA on CPAP Assessment & Plan: Chronic, she  reports compliance with her CPAP.  She wears at least four hours per night. She has recognized benefit from continued use.  - Unfortunately, her employer does not cover Zepbound for treatment of OSA   Osteopenia, unspecified  location Assessment & Plan: Osteopenia identified on previous DEXA scan in June 2023. - Schedule DEXA scan for December 2025 to monitor bone density.  Orders: -     DG Bone Density; Future  External hemorrhoid -     Hydrocortisone ; Apply topically 2 (two) times daily.  Dispense: 30 g; Refill: 0  Class 2 severe obesity due to excess calories with serious comorbidity and body mass index (BMI) of 36.0 to 36.9 in adult [facility]) Assessment & Plan: Obesity managed with semaglutide [facility]) injections at [facility]. Discussed potential side effects, including abdominal pain, and advised to discontinue if symptoms occur.  - Incorporate strength training into workout routine - Optimize protein intake to decrease risk of muscle atrophy   Other orders -     EPINEPHrine ; Inject 0.3 mg into the muscle as needed for systemic reaction  Dispense: 2 each; Refill: 1   Return for 1 year HM , 6 month chol f/u.SABRA Patient was given opportunity to ask questions. Patient verbalized understanding of the plan and was able to repeat key elements of the plan. All questions were answered to their satisfaction.   I, Victoria LOISE Slocumb, MD, have reviewed all documentation for this visit. The documentation on 05/25/24 for the exam, diagnosis, procedures, and orders are all accurate and complete.

## 2024-05-25 NOTE — Patient Instructions (Signed)

## 2024-05-26 LAB — CMP14+EGFR
ALT: 17 IU/L (ref 0–32)
AST: 21 IU/L (ref 0–40)
Albumin: 4.2 g/dL (ref 3.9–4.9)
Alkaline Phosphatase: 79 IU/L (ref 44–121)
BUN/Creatinine Ratio: 28 (ref 12–28)
BUN: 17 mg/dL (ref 8–27)
Bilirubin Total: 0.5 mg/dL (ref 0.0–1.2)
CO2: 22 mmol/L (ref 20–29)
Calcium: 9.2 mg/dL (ref 8.7–10.3)
Chloride: 101 mmol/L (ref 96–106)
Creatinine, Ser: 0.6 mg/dL (ref 0.57–1.00)
Globulin, Total: 2.3 g/dL (ref 1.5–4.5)
Glucose: 74 mg/dL (ref 70–99)
Potassium: 4.5 mmol/L (ref 3.5–5.2)
Sodium: 137 mmol/L (ref 134–144)
Total Protein: 6.5 g/dL (ref 6.0–8.5)
eGFR: 102 mL/min/1.73 (ref >59)

## 2024-05-26 LAB — CBC
Hematocrit: 42.5 % (ref 34.0–46.6)
Hemoglobin: 13.7 g/dL (ref 11.1–15.9)
MCH: 29 pg (ref 26.6–33.0)
MCHC: 32.2 g/dL (ref 31.5–35.7)
MCV: 90 fL (ref 79–97)
Platelets: 261 x10E3/uL (ref 150–450)
RBC: 4.73 x10E6/uL (ref 3.77–5.28)
RDW: 14.1 % (ref 11.7–15.4)
WBC: 7.3 x10E3/uL (ref 3.4–10.8)

## 2024-05-26 LAB — LIPID PANEL
Chol/HDL Ratio: 2.3 ratio (ref 0.0–4.4)
Cholesterol, Total: 174 mg/dL (ref 100–199)
HDL: 75 mg/dL (ref >39)
LDL Chol Calc (NIH): 88 mg/dL (ref 0–99)
Triglycerides: 55 mg/dL (ref 0–149)
VLDL Cholesterol Cal: 11 mg/dL (ref 5–40)

## 2024-05-26 LAB — HEMOGLOBIN A1C
Est. average glucose Bld gHb Est-mCnc: 108 mg/dL
Hgb A1c MFr Bld: 5.4 % (ref 4.8–5.6)

## 2024-05-27 ENCOUNTER — Ambulatory Visit: Payer: Self-pay | Admitting: Internal Medicine

## 2024-05-27 DIAGNOSIS — G479 Sleep disorder, unspecified: Secondary | ICD-10-CM | POA: Diagnosis not present

## 2024-05-27 DIAGNOSIS — Z6835 Body mass index (BMI) 35.0-35.9, adult: Secondary | ICD-10-CM | POA: Diagnosis not present

## 2024-05-27 DIAGNOSIS — R7309 Other abnormal glucose: Secondary | ICD-10-CM | POA: Diagnosis not present

## 2024-05-30 DIAGNOSIS — M8580 Other specified disorders of bone density and structure, unspecified site: Secondary | ICD-10-CM | POA: Insufficient documentation

## 2024-05-30 NOTE — Assessment & Plan Note (Signed)
 Previous labs reviewed, her A1c has been elevated in the past. I will check an A1c today. Reminded to avoid refined sugars including sugary drinks/foods and processed meats including bacon, sausages and deli meats.

## 2024-05-30 NOTE — Assessment & Plan Note (Signed)
 Chronic, she will continue with atorvastatin 20mg  daily.  She is encouraged to follow a heart healthy lifestyle.

## 2024-05-30 NOTE — Assessment & Plan Note (Signed)
 Osteopenia identified on previous DEXA scan in June 2023. - Schedule DEXA scan for December 2025 to monitor bone density.

## 2024-05-30 NOTE — Assessment & Plan Note (Signed)
 Raynaud's phenomenon managed as needed during winter months. - Resume amlodipine  when needed

## 2024-05-30 NOTE — Assessment & Plan Note (Signed)
 Chronic, she reports compliance with her CPAP.  She wears at least four hours per night. She has recognized benefit from continued use.  - Unfortunately, her employer does not cover Zepbound for treatment of OSA

## 2024-05-30 NOTE — Assessment & Plan Note (Addendum)
 Obesity managed with semaglutide [facility]) injections at [facility]. Discussed potential side effects, including abdominal pain, and advised to discontinue if symptoms occur.  - Incorporate strength training into workout routine - Optimize protein intake to decrease risk of muscle atrophy

## 2024-05-31 DIAGNOSIS — G4733 Obstructive sleep apnea (adult) (pediatric): Secondary | ICD-10-CM | POA: Diagnosis not present

## 2024-06-01 ENCOUNTER — Other Ambulatory Visit (HOSPITAL_COMMUNITY): Payer: Self-pay

## 2024-06-02 ENCOUNTER — Encounter (HOSPITAL_COMMUNITY): Payer: Self-pay

## 2024-06-02 ENCOUNTER — Other Ambulatory Visit: Payer: Self-pay

## 2024-06-02 ENCOUNTER — Other Ambulatory Visit (HOSPITAL_COMMUNITY): Payer: Self-pay

## 2024-06-17 ENCOUNTER — Other Ambulatory Visit: Payer: Self-pay | Admitting: Internal Medicine

## 2024-06-17 DIAGNOSIS — Z1231 Encounter for screening mammogram for malignant neoplasm of breast: Secondary | ICD-10-CM

## 2024-06-28 ENCOUNTER — Encounter: Payer: Self-pay | Admitting: Family Medicine

## 2024-06-28 NOTE — Telephone Encounter (Signed)
 Amy- attached report below for your review

## 2024-06-29 ENCOUNTER — Ambulatory Visit
Admission: RE | Admit: 2024-06-29 | Discharge: 2024-06-29 | Disposition: A | Discharge status: Home / Self Care | Source: Ambulatory Visit

## 2024-06-29 DIAGNOSIS — Z1231 Encounter for screening mammogram for malignant neoplasm of breast: Secondary | ICD-10-CM | POA: Diagnosis not present

## 2024-07-23 ENCOUNTER — Encounter: Payer: Self-pay | Admitting: Internal Medicine

## 2024-07-23 ENCOUNTER — Other Ambulatory Visit: Payer: Self-pay

## 2024-07-23 DIAGNOSIS — E7800 Pure hypercholesterolemia, unspecified: Secondary | ICD-10-CM

## 2024-08-02 ENCOUNTER — Other Ambulatory Visit: Payer: Self-pay

## 2024-08-02 ENCOUNTER — Other Ambulatory Visit (HOSPITAL_COMMUNITY): Payer: Self-pay

## 2024-08-23 ENCOUNTER — Other Ambulatory Visit (HOSPITAL_COMMUNITY): Payer: Self-pay

## 2024-09-01 ENCOUNTER — Encounter: Payer: Self-pay | Admitting: Internal Medicine

## 2024-09-04 DIAGNOSIS — G4733 Obstructive sleep apnea (adult) (pediatric): Secondary | ICD-10-CM | POA: Diagnosis not present

## 2024-09-08 ENCOUNTER — Ambulatory Visit: Payer: Self-pay | Admitting: Internal Medicine

## 2024-09-09 ENCOUNTER — Telehealth: Payer: Self-pay | Admitting: Internal Medicine

## 2024-09-09 DIAGNOSIS — D229 Melanocytic nevi, unspecified: Secondary | ICD-10-CM

## 2024-09-09 DIAGNOSIS — L989 Disorder of the skin and subcutaneous tissue, unspecified: Secondary | ICD-10-CM | POA: Diagnosis not present

## 2024-09-09 NOTE — Progress Notes (Signed)
 Virtual Visit via Video Note  I,Victoria T Hamilton, CMA,acting as a neurosurgeon for Catheryn LOISE Slocumb, MD.,have documented all relevant documentation on the behalf of Catheryn LOISE Slocumb, MD,as directed by  Catheryn LOISE Slocumb, MD while in the presence of Catheryn LOISE Slocumb, MD.  I connected with Victoria Herman on 09/09/24 at  2:20 PM EST by a video enabled telemedicine application and verified that I am speaking with the correct person using two identifiers.  Patient Location: Home Provider Location: Office/Clinic  I discussed the limitations, risks, security, and privacy concerns of performing an evaluation and management service by video and the availability of in person appointments. I also discussed with the patient that there may be a patient responsible charge related to this service. The patient expressed understanding and agreed to proceed.  Subjective: PCP: Slocumb Catheryn, MD  Chief Complaint  Patient presents with   Referral    Patient presents today requesting a referral for derm.    She presents today for virtual visit to discuss need for referral. She would like referral to a new dermatologist. Wants to have full body check. She also has a lesion that she would like to have evaluated. She is concerned about possibly having precancerous cells.      ROS: Per HPI  Current Outpatient Medications:    atorvastatin  (LIPITOR) 20 MG tablet, Take 1 tablet (20 mg total) by mouth daily Monday through Saturday, skip Sunday, Disp: 90 tablet, Rfl: 2   Barberry-Oreg Grape-Goldenseal (BERBERINE COMPLEX PO), Take 2 tablets by mouth daily., Disp: , Rfl:    Calcium  Carb-Cholecalciferol (CALCIUM  600 + D PO), Take 1 tablet by mouth daily. Vit D 12.5, Disp: , Rfl:    CALCIUM  PO, Take by mouth., Disp: , Rfl:    cetirizine (ZYRTEC) 10 MG tablet, Take 10 mg by mouth daily., Disp: , Rfl:    Cholecalciferol (VITAMIN D3 SUPER STRENGTH) 50 MCG (2000 UT) CAPS, Take 2,000 Units by mouth daily., Disp: , Rfl:     citalopram  (CELEXA ) 20 MG tablet, Take 1 tablet (20 mg total) by mouth daily., Disp: 90 tablet, Rfl: 2   EPINEPHrine  (EPIPEN  2-PAK) 0.3 mg/0.3 mL IJ SOAJ injection, Inject 0.3 mg into the muscle as needed for systemic reaction, Disp: 2 each, Rfl: 1   hydrocortisone  2.5 % cream, Apply topically 2 (two) times daily., Disp: 30 g, Rfl: 0   metroNIDAZOLE  (METROCREAM ) 0.75 % cream, Apply dime sized amount to face twice a day as directed, taper dose to once daily once improved, Disp: 45 g, Rfl: 3   mometasone  (NASONEX ) 50 MCG/ACT nasal spray, Use 2 sprays in each nostril once daily, Disp: 17 g, Rfl: 5   amLODipine  (NORVASC ) 5 MG tablet, Take 1 tablet (5 mg total) by mouth daily. (Patient not taking: Reported on 09/09/2024), Disp: 30 tablet, Rfl: 2   mupirocin  ointment (BACTROBAN ) 2 %, Apply topically 2 (two) times daily. (Patient not taking: Reported on 09/09/2024), Disp: 22 g, Rfl: 0  Observations/Objective: There were no vitals filed for this visit. Physical Exam Vitals and nursing note reviewed.  Constitutional:      Appearance: Normal appearance.  HENT:     Head: Normocephalic and atraumatic.  Eyes:     Extraocular Movements: Extraocular movements intact.  Pulmonary:     Effort: Pulmonary effort is normal.  Musculoskeletal:     Cervical back: Normal range of motion.  Skin:    General: Skin is warm.  Neurological:     General: No focal deficit  present.     Mental Status: She is alert.  Psychiatric:        Mood and Affect: Mood normal.        Behavior: Behavior normal.     Assessment and Plan: Nevus Assessment & Plan: I will refer to [facility] as requested. Advised to call weekly to get on cancellation list.   Orders: -     Ambulatory referral to Dermatology    Follow Up Instructions: Return if symptoms worsen or fail to improve.   I discussed the assessment and treatment plan with the patient. The patient was provided an opportunity to ask questions, and all were  answered. The patient agreed with the plan and demonstrated an understanding of the instructions.   The patient was advised to call back or seek an in-person evaluation if the symptoms worsen or if the condition fails to improve as anticipated.  The above assessment and management plan was discussed with the patient. The patient verbalized understanding of and has agreed to the management plan.   I, Catheryn LOISE Slocumb, MD, have reviewed all documentation for this visit. The documentation on 09/12/24 for the exam, diagnosis, procedures, and orders are all accurate and complete.

## 2024-09-12 ENCOUNTER — Encounter: Payer: Self-pay | Admitting: Internal Medicine

## 2024-09-12 DIAGNOSIS — D229 Melanocytic nevi, unspecified: Secondary | ICD-10-CM | POA: Insufficient documentation

## 2024-09-12 NOTE — Assessment & Plan Note (Signed)
 I will refer to [facility] as requested. Advised to call weekly to get on cancellation list.

## 2024-09-16 ENCOUNTER — Other Ambulatory Visit (HOSPITAL_BASED_OUTPATIENT_CLINIC_OR_DEPARTMENT_OTHER)

## 2024-10-21 ENCOUNTER — Other Ambulatory Visit (HOSPITAL_BASED_OUTPATIENT_CLINIC_OR_DEPARTMENT_OTHER)

## 2024-10-26 ENCOUNTER — Other Ambulatory Visit: Payer: Self-pay | Admitting: Rheumatology

## 2024-10-26 ENCOUNTER — Other Ambulatory Visit: Payer: Self-pay

## 2024-10-26 ENCOUNTER — Other Ambulatory Visit (HOSPITAL_COMMUNITY): Payer: Self-pay

## 2024-10-26 MED ORDER — AMLODIPINE BESYLATE 5 MG PO TABS
5.0000 mg | ORAL_TABLET | Freq: Every day | ORAL | 2 refills | Status: AC
  Filled 2024-10-26: qty 30, 30d supply, fill #0

## 2024-10-26 NOTE — Telephone Encounter (Signed)
 Last Fill: 10/30/2021  Next Visit: 03/17/2025  Last Visit: 03/16/2024  Dx: Raynaud's phenomenon without gangrene   Current Dose per office note on 03/16/2024: dose not mentioned.   Okay to refill Amlodipine ?

## 2024-11-18 ENCOUNTER — Other Ambulatory Visit (HOSPITAL_BASED_OUTPATIENT_CLINIC_OR_DEPARTMENT_OTHER)

## 2024-11-25 ENCOUNTER — Ambulatory Visit: Payer: Self-pay | Admitting: Internal Medicine

## 2025-03-17 ENCOUNTER — Ambulatory Visit: Admitting: Rheumatology

## 2025-04-26 ENCOUNTER — Ambulatory Visit: Admitting: Family Medicine

## 2025-04-28 ENCOUNTER — Ambulatory Visit: Admitting: Family Medicine

## 2025-05-31 ENCOUNTER — Encounter: Payer: Self-pay | Admitting: Internal Medicine
